# Patient Record
Sex: Female | Born: 1980
Health system: Southern US, Community
[De-identification: ages and names within clinical notes are randomized; demographics above are authoritative.]

## PROBLEM LIST (undated history)

## (undated) DIAGNOSIS — R51 Headache: Secondary | ICD-10-CM

## (undated) DIAGNOSIS — I8390 Asymptomatic varicose veins of unspecified lower extremity: Secondary | ICD-10-CM

## (undated) DIAGNOSIS — I499 Cardiac arrhythmia, unspecified: Secondary | ICD-10-CM

## (undated) DIAGNOSIS — F32A Depression, unspecified: Secondary | ICD-10-CM

## (undated) DIAGNOSIS — L709 Acne, unspecified: Secondary | ICD-10-CM

## (undated) DIAGNOSIS — M94261 Chondromalacia, right knee: Secondary | ICD-10-CM

## (undated) DIAGNOSIS — G4700 Insomnia, unspecified: Secondary | ICD-10-CM

## (undated) DIAGNOSIS — R000 Tachycardia, unspecified: Secondary | ICD-10-CM

## (undated) DIAGNOSIS — F329 Major depressive disorder, single episode, unspecified: Secondary | ICD-10-CM

## (undated) DIAGNOSIS — F419 Anxiety disorder, unspecified: Secondary | ICD-10-CM

## (undated) DIAGNOSIS — Z889 Allergy status to unspecified drugs, medicaments and biological substances status: Secondary | ICD-10-CM

## (undated) HISTORY — DX: Tachycardia, unspecified: R00.0

## (undated) HISTORY — PX: SPINE SURGERY: SHX786

## (undated) HISTORY — DX: Depression, unspecified: F32.A

## (undated) HISTORY — DX: Acne, unspecified: L70.9

## (undated) HISTORY — DX: Asymptomatic varicose veins of unspecified lower extremity: I83.90

## (undated) HISTORY — DX: Chondromalacia, right knee: M94.261

## (undated) HISTORY — DX: Anxiety disorder, unspecified: F41.9

## (undated) HISTORY — PX: MYRINGOTOMY: SHX2060

## (undated) HISTORY — DX: Major depressive disorder, single episode, unspecified: F32.9

---

## 2004-03-02 ENCOUNTER — Encounter: Admission: RE | Admit: 2004-03-02 | Discharge: 2004-03-02 | Payer: Self-pay | Admitting: Obstetrics and Gynecology

## 2004-11-27 ENCOUNTER — Ambulatory Visit: Payer: Self-pay | Admitting: Family Medicine

## 2005-02-27 ENCOUNTER — Ambulatory Visit: Payer: Self-pay | Admitting: Family Medicine

## 2005-06-04 ENCOUNTER — Encounter: Payer: Self-pay | Admitting: Family Medicine

## 2005-06-04 LAB — CONVERTED CEMR LAB

## 2005-06-22 ENCOUNTER — Ambulatory Visit: Payer: Self-pay | Admitting: Family Medicine

## 2006-02-11 ENCOUNTER — Ambulatory Visit: Payer: Self-pay | Admitting: Family Medicine

## 2006-02-25 ENCOUNTER — Ambulatory Visit: Payer: Self-pay | Admitting: Cardiology

## 2006-03-06 ENCOUNTER — Encounter: Payer: Self-pay | Admitting: Cardiology

## 2006-03-06 ENCOUNTER — Ambulatory Visit: Payer: Self-pay

## 2006-05-02 ENCOUNTER — Ambulatory Visit: Payer: Self-pay | Admitting: Cardiology

## 2006-12-07 ENCOUNTER — Emergency Department: Payer: Self-pay | Admitting: Internal Medicine

## 2007-05-05 LAB — CONVERTED CEMR LAB: Pap Smear: NORMAL

## 2007-06-25 ENCOUNTER — Ambulatory Visit: Payer: Self-pay | Admitting: Family Medicine

## 2007-06-25 DIAGNOSIS — F909 Attention-deficit hyperactivity disorder, unspecified type: Secondary | ICD-10-CM | POA: Insufficient documentation

## 2007-06-25 DIAGNOSIS — M4120 Other idiopathic scoliosis, site unspecified: Secondary | ICD-10-CM | POA: Insufficient documentation

## 2007-06-25 DIAGNOSIS — F3289 Other specified depressive episodes: Secondary | ICD-10-CM | POA: Insufficient documentation

## 2007-06-25 DIAGNOSIS — F329 Major depressive disorder, single episode, unspecified: Secondary | ICD-10-CM

## 2007-06-26 LAB — CONVERTED CEMR LAB
Albumin: 3.2 g/dL — ABNORMAL LOW (ref 3.5–5.2)
Alkaline Phosphatase: 45 units/L (ref 39–117)
Basophils Absolute: 0 10*3/uL (ref 0.0–0.1)
Basophils Relative: 0.3 % (ref 0.0–1.0)
CO2: 24 meq/L (ref 19–32)
Calcium: 8.9 mg/dL (ref 8.4–10.5)
Cholesterol: 153 mg/dL (ref 0–200)
Eosinophils Relative: 0.6 % (ref 0.0–5.0)
GFR calc non Af Amer: 127 mL/min
Hemoglobin: 13.2 g/dL (ref 12.0–15.0)
LDL Cholesterol: 76 mg/dL (ref 0–99)
MCV: 91.4 fL (ref 78.0–100.0)
Monocytes Absolute: 0.6 10*3/uL (ref 0.2–0.7)
Neutro Abs: 5.5 10*3/uL (ref 1.4–7.7)
Neutrophils Relative %: 66.8 % (ref 43.0–77.0)
Potassium: 3.8 meq/L (ref 3.5–5.1)
RBC: 4.09 M/uL (ref 3.87–5.11)
Total CHOL/HDL Ratio: 2.5
Total Protein: 6.3 g/dL (ref 6.0–8.3)

## 2007-08-06 ENCOUNTER — Inpatient Hospital Stay (HOSPITAL_COMMUNITY): Admission: AD | Admit: 2007-08-06 | Discharge: 2007-08-06 | Payer: Self-pay | Admitting: *Deleted

## 2007-11-11 ENCOUNTER — Encounter (INDEPENDENT_AMBULATORY_CARE_PROVIDER_SITE_OTHER): Payer: Self-pay | Admitting: Obstetrics

## 2007-11-11 ENCOUNTER — Ambulatory Visit (HOSPITAL_COMMUNITY): Admission: RE | Admit: 2007-11-11 | Discharge: 2007-11-11 | Payer: Self-pay | Admitting: Obstetrics

## 2007-11-11 ENCOUNTER — Ambulatory Visit: Payer: Self-pay | Admitting: Surgery

## 2007-12-05 ENCOUNTER — Encounter: Payer: Self-pay | Admitting: Family Medicine

## 2007-12-05 ENCOUNTER — Emergency Department (HOSPITAL_COMMUNITY): Admission: EM | Admit: 2007-12-05 | Discharge: 2007-12-05 | Payer: Self-pay | Admitting: Emergency Medicine

## 2007-12-23 ENCOUNTER — Inpatient Hospital Stay (HOSPITAL_COMMUNITY): Admission: AD | Admit: 2007-12-23 | Discharge: 2007-12-23 | Payer: Self-pay | Admitting: Obstetrics

## 2007-12-25 ENCOUNTER — Inpatient Hospital Stay (HOSPITAL_COMMUNITY): Admission: AD | Admit: 2007-12-25 | Discharge: 2007-12-29 | Payer: Self-pay | Admitting: Obstetrics and Gynecology

## 2007-12-25 ENCOUNTER — Encounter (INDEPENDENT_AMBULATORY_CARE_PROVIDER_SITE_OTHER): Payer: Self-pay | Admitting: Obstetrics and Gynecology

## 2008-06-04 HISTORY — PX: DIAGNOSTIC LAPAROSCOPY: SUR761

## 2008-08-30 ENCOUNTER — Ambulatory Visit: Payer: Self-pay | Admitting: Family Medicine

## 2008-08-30 ENCOUNTER — Encounter: Admission: RE | Admit: 2008-08-30 | Discharge: 2008-08-30 | Payer: Self-pay | Admitting: Family Medicine

## 2008-08-30 ENCOUNTER — Telehealth: Payer: Self-pay | Admitting: Family Medicine

## 2008-11-17 ENCOUNTER — Ambulatory Visit: Payer: Self-pay | Admitting: Family Medicine

## 2009-01-23 ENCOUNTER — Encounter: Payer: Self-pay | Admitting: Family Medicine

## 2009-02-24 ENCOUNTER — Ambulatory Visit: Payer: Self-pay | Admitting: Family Medicine

## 2009-02-24 ENCOUNTER — Ambulatory Visit: Payer: Self-pay | Admitting: Internal Medicine

## 2009-02-24 LAB — CONVERTED CEMR LAB
AST: 18 units/L (ref 0–37)
BUN: 11 mg/dL (ref 6–23)
Basophils Absolute: 0 10*3/uL (ref 0.0–0.1)
Basophils Relative: 0.3 % (ref 0.0–3.0)
Bilirubin Urine: NEGATIVE
Creatinine, Ser: 0.9 mg/dL (ref 0.4–1.2)
Eosinophils Absolute: 0.1 10*3/uL (ref 0.0–0.7)
Glucose, Urine, Semiquant: NEGATIVE
HCT: 39.5 % (ref 36.0–46.0)
Hemoglobin: 13.8 g/dL (ref 12.0–15.0)
Ketones, urine, test strip: NEGATIVE
Nitrite: NEGATIVE
Platelets: 215 10*3/uL (ref 150.0–400.0)
Potassium: 4.4 meq/L (ref 3.5–5.1)
Protein, U semiquant: NEGATIVE
RDW: 12.4 % (ref 11.5–14.6)
Sodium: 138 meq/L (ref 135–145)
WBC: 7.4 10*3/uL (ref 4.5–10.5)

## 2009-02-25 ENCOUNTER — Telehealth: Payer: Self-pay | Admitting: Family Medicine

## 2009-03-05 ENCOUNTER — Emergency Department (HOSPITAL_COMMUNITY): Admission: EM | Admit: 2009-03-05 | Discharge: 2009-03-06 | Payer: Self-pay | Admitting: Emergency Medicine

## 2009-03-05 ENCOUNTER — Encounter: Payer: Self-pay | Admitting: Family Medicine

## 2009-03-05 ENCOUNTER — Telehealth: Payer: Self-pay | Admitting: Internal Medicine

## 2009-03-06 ENCOUNTER — Encounter: Payer: Self-pay | Admitting: Family Medicine

## 2009-03-07 ENCOUNTER — Ambulatory Visit: Payer: Self-pay | Admitting: Family Medicine

## 2009-03-11 ENCOUNTER — Encounter: Admission: RE | Admit: 2009-03-11 | Discharge: 2009-03-11 | Payer: Self-pay | Admitting: Family Medicine

## 2009-03-14 ENCOUNTER — Ambulatory Visit: Payer: Self-pay | Admitting: Gastroenterology

## 2009-03-14 ENCOUNTER — Encounter: Payer: Self-pay | Admitting: Family Medicine

## 2009-03-14 DIAGNOSIS — D1803 Hemangioma of intra-abdominal structures: Secondary | ICD-10-CM

## 2009-03-14 LAB — CONVERTED CEMR LAB
AST: 20 units/L (ref 0–37)
Albumin: 4.2 g/dL (ref 3.5–5.2)
Alkaline Phosphatase: 57 units/L (ref 39–117)
BUN: 8 mg/dL (ref 6–23)
Chloride: 106 meq/L (ref 96–112)
Creatinine, Ser: 0.7 mg/dL (ref 0.4–1.2)
Eosinophils Absolute: 0.1 10*3/uL (ref 0.0–0.7)
GFR calc non Af Amer: 104.5 mL/min (ref >60)
HCT: 41 % (ref 36.0–46.0)
Lymphocytes Relative: 40.1 % (ref 12.0–46.0)
Monocytes Absolute: 0.3 10*3/uL (ref 0.1–1.0)
Monocytes Relative: 6.9 % (ref 3.0–12.0)
Neutro Abs: 2.5 10*3/uL (ref 1.4–7.7)
RDW: 12.3 % (ref 11.5–14.6)
Total Bilirubin: 0.7 mg/dL (ref 0.3–1.2)
WBC: 4.8 10*3/uL (ref 4.5–10.5)

## 2009-03-16 LAB — CONVERTED CEMR LAB: Tissue Transglutaminase Ab, IgA: 0.3 units (ref <7)

## 2009-04-12 ENCOUNTER — Ambulatory Visit: Payer: Self-pay | Admitting: Gastroenterology

## 2009-04-13 LAB — CONVERTED CEMR LAB
Bilirubin Urine: NEGATIVE
Ketones, ur: NEGATIVE mg/dL
Total Protein, Urine: NEGATIVE mg/dL
Urine Glucose: NEGATIVE mg/dL
Urobilinogen, UA: 0.2 (ref 0.0–1.0)
pH: 7 (ref 5.0–8.0)

## 2009-04-14 ENCOUNTER — Ambulatory Visit (HOSPITAL_COMMUNITY): Admission: RE | Admit: 2009-04-14 | Discharge: 2009-04-14 | Payer: Self-pay | Admitting: Gastroenterology

## 2009-04-15 ENCOUNTER — Ambulatory Visit: Payer: Self-pay | Admitting: Family Medicine

## 2009-04-15 ENCOUNTER — Telehealth: Payer: Self-pay | Admitting: Gastroenterology

## 2009-04-15 DIAGNOSIS — F411 Generalized anxiety disorder: Secondary | ICD-10-CM | POA: Insufficient documentation

## 2009-04-18 LAB — CONVERTED CEMR LAB
LDL Cholesterol: 87 mg/dL (ref 0–99)
TSH: 1.73 microintl units/mL (ref 0.35–5.50)
Total CHOL/HDL Ratio: 3

## 2009-04-19 ENCOUNTER — Telehealth: Payer: Self-pay | Admitting: Gastroenterology

## 2009-04-20 ENCOUNTER — Encounter (INDEPENDENT_AMBULATORY_CARE_PROVIDER_SITE_OTHER): Payer: Self-pay | Admitting: *Deleted

## 2009-04-20 ENCOUNTER — Telehealth: Payer: Self-pay | Admitting: Gastroenterology

## 2009-04-25 ENCOUNTER — Encounter: Payer: Self-pay | Admitting: Family Medicine

## 2009-05-02 ENCOUNTER — Encounter (INDEPENDENT_AMBULATORY_CARE_PROVIDER_SITE_OTHER): Payer: Self-pay | Admitting: *Deleted

## 2009-05-03 ENCOUNTER — Ambulatory Visit: Payer: Self-pay | Admitting: Gastroenterology

## 2009-05-11 ENCOUNTER — Ambulatory Visit: Payer: Self-pay | Admitting: Gastroenterology

## 2009-05-13 ENCOUNTER — Telehealth: Payer: Self-pay | Admitting: Internal Medicine

## 2009-05-16 ENCOUNTER — Encounter: Payer: Self-pay | Admitting: Gastroenterology

## 2009-05-16 DIAGNOSIS — D239 Other benign neoplasm of skin, unspecified: Secondary | ICD-10-CM

## 2009-05-16 HISTORY — DX: Other benign neoplasm of skin, unspecified: D23.9

## 2009-05-30 ENCOUNTER — Ambulatory Visit: Payer: Self-pay | Admitting: Family Medicine

## 2009-08-05 ENCOUNTER — Ambulatory Visit (HOSPITAL_COMMUNITY): Admission: RE | Admit: 2009-08-05 | Discharge: 2009-08-05 | Payer: Self-pay | Admitting: Obstetrics

## 2009-12-01 ENCOUNTER — Ambulatory Visit: Payer: Self-pay | Admitting: Sports Medicine

## 2009-12-01 DIAGNOSIS — M25569 Pain in unspecified knee: Secondary | ICD-10-CM

## 2009-12-02 ENCOUNTER — Encounter (INDEPENDENT_AMBULATORY_CARE_PROVIDER_SITE_OTHER): Payer: Self-pay | Admitting: *Deleted

## 2009-12-02 ENCOUNTER — Encounter: Admission: RE | Admit: 2009-12-02 | Discharge: 2009-12-02 | Payer: Self-pay | Admitting: Family Medicine

## 2009-12-09 ENCOUNTER — Ambulatory Visit (HOSPITAL_COMMUNITY): Admission: RE | Admit: 2009-12-09 | Discharge: 2009-12-09 | Payer: Self-pay | Admitting: Family Medicine

## 2009-12-12 ENCOUNTER — Encounter: Payer: Self-pay | Admitting: Sports Medicine

## 2010-03-06 ENCOUNTER — Encounter: Payer: Self-pay | Admitting: Family Medicine

## 2010-03-29 ENCOUNTER — Telehealth (INDEPENDENT_AMBULATORY_CARE_PROVIDER_SITE_OTHER): Payer: Self-pay | Admitting: *Deleted

## 2010-04-03 ENCOUNTER — Ambulatory Visit: Payer: Self-pay | Admitting: Family Medicine

## 2010-04-03 LAB — CONVERTED CEMR LAB
Albumin: 3.7 g/dL (ref 3.5–5.2)
Alkaline Phosphatase: 47 units/L (ref 39–117)
BUN: 15 mg/dL (ref 6–23)
Basophils Absolute: 0 10*3/uL (ref 0.0–0.1)
Basophils Relative: 0.5 % (ref 0.0–3.0)
Chloride: 97 meq/L (ref 96–112)
Creatinine, Ser: 0.8 mg/dL (ref 0.4–1.2)
Eosinophils Relative: 0.7 % (ref 0.0–5.0)
Glucose, Bld: 74 mg/dL (ref 70–99)
HCT: 36.4 % (ref 36.0–46.0)
HDL: 51.9 mg/dL (ref >39.00)
Lymphocytes Relative: 27 % (ref 12.0–46.0)
Lymphs Abs: 2.4 10*3/uL (ref 0.7–4.0)
MCV: 90.4 fL (ref 78.0–100.0)
Monocytes Absolute: 0.6 10*3/uL (ref 0.1–1.0)
Neutro Abs: 5.9 10*3/uL (ref 1.4–7.7)
Neutrophils Relative %: 65.1 % (ref 43.0–77.0)
Platelets: 254 10*3/uL (ref 150.0–400.0)
Sodium: 136 meq/L (ref 135–145)
Total Protein: 6.5 g/dL (ref 6.0–8.3)
Triglycerides: 68 mg/dL (ref 0.0–149.0)

## 2010-04-10 HISTORY — PX: KNEE SURGERY: SHX244

## 2010-04-11 ENCOUNTER — Ambulatory Visit: Payer: Self-pay | Admitting: Family Medicine

## 2010-04-13 ENCOUNTER — Ambulatory Visit (HOSPITAL_BASED_OUTPATIENT_CLINIC_OR_DEPARTMENT_OTHER): Admission: RE | Admit: 2010-04-13 | Discharge: 2010-04-13 | Payer: Self-pay | Admitting: Orthopedic Surgery

## 2010-06-12 ENCOUNTER — Encounter: Payer: Self-pay | Admitting: Family Medicine

## 2010-07-05 NOTE — Letter (Signed)
Summary: [facility]   Imported By: Marily Memos 12/13/2009 15:06:53  _____________________________________________________________________  External Attachment:    Type:   Image     Comment:   External Document

## 2010-07-05 NOTE — Assessment & Plan Note (Signed)
Summary: NP , R KNEE ISSUES,MC   Vital Signs:  Patient profile:   30 year old female BP sitting:   110 / 77  Vitals Entered By: Lillia Pauls CMA (December 01, 2009 9:46 AM)  Primary Care Provider:  [facility]   History of Present Illness: 30 yo F here with right knee pain  Patient reports remote injury when she was 30 years old - believes she was kicked in the knee when playing soccer Had swelling, limping, instability at that time - told her knee was sprained with only x-rays. Has continued to struggle intermittently since that time with medial right knee pain. No current swelling but does have catching and giving out. Worse with twisting and with running - would like to run but pain limits her. Able to walk with minimal pain currently.  Allergies (verified): 1)  ! Effexor 2)  ! Lexapro  Physical Exam  General:  Well-developed,well-nourished,in no acute distress; alert,appropriate and cooperative throughout examination Msk:  Knee: Normal to inspection with no erythema or effusion or obvious bony abnormalities. Medial joint line TTP.  No post patellar facet or lateral joint line TTP. ROM normal in flexion and extension and lower leg rotation.  Mild pain with hyperflexion. Ligaments with solid consistent endpoints including ACL, PCL, LCL, MCL. Mcmurrays causes pain medially. Non painful patellar compression. Patellar and quadriceps tendons unremarkable. Hamstring and quadriceps strength is normal.  Hip abduction strength 5/5 No VMO atrophy.   Impression & Recommendations:  Problem # 1:  KNEE PAIN, RIGHT (ICD-719.46) Assessment New Concern for medial meniscal tear vs early DJD.  Will get x-rays to further assess.  This may date back to injury suffered when she was 30 years old.  If x-rays without evidence significant DJD, will get MRI of knee to assess for meniscal tear and likely refer to ortho for arthroscopy.  She would like to get back to running and has been dealing  with this for several years so will be aggressive in treatment.  Update: x-ray results show minimal DJD so will proceed with MRI of right knee.  Discussed results with patient.  Orders: Diagnostic X-Ray/Fluoroscopy (Diagnostic X-Ray/Flu) MRI without Contrast (MRI w/o Contrast)  Complete Medication List: 1)  Calcium 600 Mg Tabs (Calcium) .... Take 1-2 by mouth daily 2)  Klonopin 1 Mg Tabs (Clonazepam) .... Take one and one half tablet at bedtime 3)  Merina Iud  4)  Oracea 40 Mg Cpdr (Doxycycline (rosacea)) .Marland Kitchen.. 1 by mouth once daily 5)  Lamictal 100 Mg Tabs (Lamotrigine) .... Take one tablet once daily by mouth 6)  Cheratussin Ac 100-10 Mg/24ml Syrp (Guaifenesin-codeine) .... 5 ml at bedtime as needed for cough.  Patient Instructions: 1)  cell: [phone] - [phone]

## 2010-07-05 NOTE — Letter (Signed)
Summary: Consuela Mimes MD  Consuela Mimes MD   Imported By: Lanelle Bal 03/27/2010 15:38:06  _____________________________________________________________________  External Attachment:    Type:   Image     Comment:   External Document

## 2010-07-05 NOTE — Miscellaneous (Signed)
Summary: MRI APPT  [place] THURS JULY 7TH AT 10AM

## 2010-07-05 NOTE — Progress Notes (Signed)
----   Converted from flag ---- ---- 03/28/2010 9:54 PM, Judith Part MD wrote: please check lipids and wellness, thanks [email or web address]  ---- 03/23/2010 11:57 AM, Liane Comber CMA (AAMA) wrote: Lab orders please! Good Morning! This pt is scheduled for cpx labs Monday, which labs to draw and dx codes to use? Thanks Tasha ------------------------------

## 2010-07-05 NOTE — Assessment & Plan Note (Signed)
Summary: CPX/DLO   Vital Signs:  Patient profile:   30 year old female Height:      67.25 inches Weight:      172.25 pounds BMI:     26.87 Temp:     98.8 degrees F oral Pulse rate:   84 / minute Pulse rhythm:   regular BP sitting:   104 / 72  (left arm) Cuff size:   regular  Vitals Entered By: Lewanda Rife LPN (April 11, 2010 9:31 AM) CC: CPX GYN did pap and breast exam   History of Present Illness: here for wellness exam and to review chronic problems   has been very busy at work -- doing well overall   is having knee surgery on thursday -- for stage 3 chondromalasia  will go in scope and lateral release on R knee - just the R  also clean out arthritis under knee cap as well  has done physical therapy   wants to stay active -- used to walk run -- has not done since august  no opportunity to swim bike is painful   wt is down 3 lb with bmi of 26 was down to 165 in past -- when she could exercise   bp great t 104/72   lipids are very good with trig of 60 and HDL 51 and LDL of 76    gyn exam without pap in oct (gets pap every 3 years )  sees gyn yearly  no breast lumps   emotionally is a lot better on current meds -- is finally sleeping 6-7 hours per night  ambien made a big difference  is ok with anxiety -- it still comes and goes  some stress - son had to trigger thumb repair- that went well  still has great anxiety over giving injections -- 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 Effexor 2)  ! 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 Eyes:  Denies blurring and eye pain. CV:  Denies chest pain or discomfort, lightheadness, palpitations, and shortness of breath with exertion. Resp:  Denies cough and shortness of breath. GI:  Denies abdominal pain, bloody stools, change in bowel habits, indigestion, and nausea. GU:  Denies abnormal vaginal bleeding, discharge, dysuria, and urinary frequency. MS:  Complains of joint pain, joint swelling, and stiffness; denies joint redness and muscle weakness. Derm:  Denies itching, lesion(s), poor wound healing, and rash. Neuro:  Denies numbness and tingling. Psych:  overall doing better with her mood . Endo:  Denies cold intolerance, excessive thirst, excessive urination, and heat intolerance. Heme:  Denies abnormal bruising and bleeding.  Physical Exam  General:  Well-developed,well-nourished,in no acute distress; alert,appropriate and cooperative throughout examination Head:  normocephalic, atraumatic, and no abnormalities observed.   Eyes:  vision grossly intact, pupils  equal, pupils round, and pupils reactive to light.  no conjunctival pallor, injection or icterus  Ears:  R ear normal and L ear normal.   Nose:  no nasal discharge.   Mouth:  pharynx pink and moist.   Neck:  supple with full rom and no masses or thyromegally, no JVD or carotid bruit  Chest Wall:  No deformities, masses, or tenderness noted. Lungs:  Normal respiratory effort, chest expands symmetrically.  Occassional exp wheezes. Heart:  Normal rate and regular rhythm. S1 and S2 normal without gallop, murmur, click, rub or other extra sounds. Abdomen:  Bowel sounds positive,abdomen soft and non-tender without masses,  organomegaly or hernias noted. no renal bruits Msk:  No deformity or scoliosis noted of thoracic or lumbar spine.  poor rom R knee  Pulses:  R and L carotid,radial,femoral,dorsalis pedis and posterior tibial pulses are full and equal bilaterally Extremities:  No clubbing, cyanosis, edema, or deformity noted with normal full range of motion of all joints.   Neurologic:  sensation intact to light touch, gait normal, and DTRs symmetrical and normal.   Skin:  Intact without suspicious lesions or rashes Cervical Nodes:  No lymphadenopathy noted Inguinal Nodes:  No significant adenopathy Psych:  normal affect, talkative and pleasant    Impression & Recommendations:  Problem # 1:  HEALTH MAINTENANCE EXAM (ICD-V70.0) Assessment Comment Only reviewed health habits including diet, exercise and skin cancer prevention reviewed health maintenance list and family history rev labs in detail will work on wt loss   Problem # 2:  KNEE PAIN, RIGHT (ICD-719.46) Assessment: Deteriorated for surgery soon - chondromalacia Her updated medication list for this problem includes:    Mobic 15 Mg Tabs (Meloxicam) .Marland Kitchen... Take 1 tablet by mouth once a day as needed.  Problem # 3:  ANXIETY (ICD-300.00) overall better with psych tx and better sleep  wlll get back to exercise when she can Her updated medication list for this problem includes:    Klonopin 1 Mg Tabs (Clonazepam) .Marland Kitchen... Take one and one half tablet at bedtime  Complete Medication List: 1)  Calcium 600 Mg Tabs (Calcium) .... Take 1-2 by mouth daily 2)  Klonopin 1 Mg Tabs (Clonazepam) .... Take one and one half tablet at bedtime 3)  Oracea 40 Mg Cpdr (Doxycycline (rosacea)) .Marland Kitchen.. 1 by mouth once daily as needed 4)  Lamictal 100 Mg Tabs (Lamotrigine) .... Take one tablet once daily by mouth 5)  Zolpidem Tartrate 10 Mg Tabs (Zolpidem tartrate) .... One tablet at bedtime as needed 6)  Mobic 15 Mg Tabs (Meloxicam) .... Take 1 tablet by mouth once a day as  needed.  Patient Instructions: 1)  labs look good  2)  work on healthy diet 3)  good luck with knee surgery    Orders Added: 1)  Est. Patient 18-39 years [[id number]]   Immunization History:  Influenza Immunization History:    Influenza:  historical [facility] at [facility] (04/04/2010)   Immunization History:  Influenza Immunization History:    Influenza:  Historical received at [facility] (04/04/2010)  Current Allergies (reviewed today): ! [facility] ! LEXAPRO

## 2010-07-05 NOTE — Letter (Signed)
Summary: SOS  SOS   Imported By: Marily Memos 12/13/2009 15:06:12  _____________________________________________________________________  External Attachment:    Type:   Image     Comment:   External Document

## 2010-07-06 NOTE — Letter (Signed)
Summary: [facility]  [facility]   Imported By: Maryln Gottron 06/26/2010 14:59:07  _____________________________________________________________________  External Attachment:    Type:   Image     Comment:   External Document

## 2010-08-08 ENCOUNTER — Emergency Department (HOSPITAL_COMMUNITY)
Admission: EM | Admit: 2010-08-08 | Discharge: 2010-08-09 | Disposition: A | Discharge status: Home / Self Care | Payer: 59 | Attending: Emergency Medicine | Admitting: Emergency Medicine

## 2010-08-08 ENCOUNTER — Inpatient Hospital Stay (INDEPENDENT_AMBULATORY_CARE_PROVIDER_SITE_OTHER)
Admission: RE | Admit: 2010-08-08 | Discharge: 2010-08-08 | Disposition: A | Discharge status: Home / Self Care | Payer: 59 | Source: Ambulatory Visit | Attending: Emergency Medicine | Admitting: Emergency Medicine

## 2010-08-08 ENCOUNTER — Emergency Department (HOSPITAL_COMMUNITY): Payer: 59

## 2010-08-08 DIAGNOSIS — F329 Major depressive disorder, single episode, unspecified: Secondary | ICD-10-CM | POA: Insufficient documentation

## 2010-08-08 DIAGNOSIS — R42 Dizziness and giddiness: Secondary | ICD-10-CM

## 2010-08-08 DIAGNOSIS — R002 Palpitations: Secondary | ICD-10-CM | POA: Insufficient documentation

## 2010-08-08 DIAGNOSIS — I498 Other specified cardiac arrhythmias: Secondary | ICD-10-CM | POA: Insufficient documentation

## 2010-08-08 DIAGNOSIS — R51 Headache: Secondary | ICD-10-CM | POA: Insufficient documentation

## 2010-08-08 DIAGNOSIS — F3289 Other specified depressive episodes: Secondary | ICD-10-CM | POA: Insufficient documentation

## 2010-08-08 LAB — CBC
MCH: 28.3 pg (ref 26.0–34.0)
Platelets: 205 10*3/uL (ref 150–400)
RBC: 3.81 MIL/uL — ABNORMAL LOW (ref 3.87–5.11)
RDW: 13.1 % (ref 11.5–15.5)
WBC: 11.2 10*3/uL — ABNORMAL HIGH (ref 4.0–10.5)

## 2010-08-08 LAB — DIFFERENTIAL
Basophils Relative: 0 % (ref 0–1)
Eosinophils Absolute: 0 10*3/uL (ref 0.0–0.7)
Neutrophils Relative %: 86 % — ABNORMAL HIGH (ref 43–77)

## 2010-08-08 LAB — RAPID URINE DRUG SCREEN, HOSP PERFORMED
Amphetamines: NOT DETECTED
Benzodiazepines: POSITIVE — AB
Cocaine: NOT DETECTED
Opiates: NOT DETECTED
Tetrahydrocannabinol: NOT DETECTED
Tetrahydrocannabinol: NOT DETECTED

## 2010-08-08 LAB — POCT I-STAT, CHEM 8
Calcium, Ion: 1.13 mmol/L (ref 1.12–1.32)
Chloride: 103 mEq/L (ref 96–112)
Glucose, Bld: 94 mg/dL (ref 70–99)
HCT: 37 % (ref 36.0–46.0)
HCT: 32 % — ABNORMAL LOW (ref 36.0–46.0)
Hemoglobin: 12.6 g/dL (ref 12.0–15.0)
Potassium: 3.4 mEq/L — ABNORMAL LOW (ref 3.5–5.1)
Sodium: 140 mEq/L (ref 135–145)
TCO2: 21 mmol/L (ref 0–100)

## 2010-08-08 LAB — URINALYSIS, ROUTINE W REFLEX MICROSCOPIC
Bilirubin Urine: NEGATIVE
Nitrite: NEGATIVE
Protein, ur: NEGATIVE mg/dL
Specific Gravity, Urine: 1.007 (ref 1.005–1.030)
Urobilinogen, UA: 0.2 mg/dL (ref 0.0–1.0)

## 2010-08-08 LAB — POCT URINALYSIS DIPSTICK
Bilirubin Urine: NEGATIVE
Glucose, UA: NEGATIVE mg/dL
Nitrite: NEGATIVE
Specific Gravity, Urine: 1.01 (ref 1.005–1.030)
pH: 7.5 (ref 5.0–8.0)

## 2010-08-08 LAB — POCT PREGNANCY, URINE
Preg Test, Ur: NEGATIVE
Preg Test, Ur: NEGATIVE

## 2010-08-08 LAB — GLUCOSE, CAPILLARY: Glucose-Capillary: 79 mg/dL (ref 70–99)

## 2010-08-09 LAB — URINE CULTURE: Culture  Setup Time: 201203062305

## 2010-08-11 DIAGNOSIS — R002 Palpitations: Secondary | ICD-10-CM | POA: Insufficient documentation

## 2010-08-14 ENCOUNTER — Encounter: Payer: 59 | Admitting: Physician Assistant

## 2010-08-14 ENCOUNTER — Ambulatory Visit: Payer: 59 | Admitting: Family Medicine

## 2010-08-27 LAB — URINALYSIS, ROUTINE W REFLEX MICROSCOPIC
Glucose, UA: NEGATIVE mg/dL
Hgb urine dipstick: NEGATIVE
Protein, ur: NEGATIVE mg/dL
Specific Gravity, Urine: 1.01 (ref 1.005–1.030)

## 2010-08-27 LAB — CBC
HCT: 38.1 % (ref 36.0–46.0)
Hemoglobin: 12.9 g/dL (ref 12.0–15.0)
MCHC: 33.8 g/dL (ref 30.0–36.0)
MCV: 90.8 fL (ref 78.0–100.0)
Platelets: 223 10*3/uL (ref 150–400)
RDW: 12.6 % (ref 11.5–15.5)

## 2010-08-27 LAB — BASIC METABOLIC PANEL
BUN: 7 mg/dL (ref 6–23)
CO2: 27 mEq/L (ref 19–32)
Chloride: 107 mEq/L (ref 96–112)
GFR calc non Af Amer: >60 mL/min (ref >60)
Glucose, Bld: 96 mg/dL (ref 70–99)
Potassium: 3.6 mEq/L (ref 3.5–5.1)
Sodium: 139 mEq/L (ref 135–145)

## 2010-09-07 ENCOUNTER — Encounter: Payer: Self-pay | Admitting: Physician Assistant

## 2010-09-07 LAB — COMPREHENSIVE METABOLIC PANEL
Albumin: 4.1 g/dL (ref 3.5–5.2)
BUN: 9 mg/dL (ref 6–23)
Calcium: 9 mg/dL (ref 8.4–10.5)
Glucose, Bld: 87 mg/dL (ref 70–99)
Potassium: 3.6 mEq/L (ref 3.5–5.1)
Total Protein: 7.4 g/dL (ref 6.0–8.3)

## 2010-09-07 LAB — URINALYSIS, ROUTINE W REFLEX MICROSCOPIC
Glucose, UA: NEGATIVE mg/dL
Ketones, ur: 40 mg/dL — AB
Leukocytes, UA: NEGATIVE
Nitrite: NEGATIVE
Protein, ur: NEGATIVE mg/dL
pH: 5 (ref 5.0–8.0)

## 2010-09-07 LAB — WET PREP, GENITAL
Trich, Wet Prep: NONE SEEN
Yeast Wet Prep HPF POC: NONE SEEN

## 2010-09-07 LAB — DIFFERENTIAL
Lymphocytes Relative: 27 % (ref 12–46)
Lymphs Abs: 2.3 10*3/uL (ref 0.7–4.0)
Monocytes Absolute: 0.5 10*3/uL (ref 0.1–1.0)
Monocytes Relative: 7 % (ref 3–12)
Neutro Abs: 5.4 10*3/uL (ref 1.7–7.7)
Neutrophils Relative %: 65 % (ref 43–77)

## 2010-09-07 LAB — URINE MICROSCOPIC-ADD ON

## 2010-09-07 LAB — CBC
HCT: 39.6 % (ref 36.0–46.0)
Hemoglobin: 13.5 g/dL (ref 12.0–15.0)
MCHC: 34.2 g/dL (ref 30.0–36.0)
Platelets: 229 10*3/uL (ref 150–400)
RDW: 12.9 % (ref 11.5–15.5)

## 2010-09-07 LAB — GC/CHLAMYDIA PROBE AMP, GENITAL: Chlamydia, DNA Probe: NEGATIVE

## 2010-09-11 ENCOUNTER — Other Ambulatory Visit: Payer: Self-pay | Admitting: Internal Medicine

## 2010-09-11 ENCOUNTER — Ambulatory Visit (INDEPENDENT_AMBULATORY_CARE_PROVIDER_SITE_OTHER): Payer: 59 | Admitting: Physician Assistant

## 2010-09-11 ENCOUNTER — Encounter: Payer: Self-pay | Admitting: Physician Assistant

## 2010-09-11 VITALS — BP 114/76 | HR 106 | Ht 68.0 in | Wt 171.0 lb

## 2010-09-11 DIAGNOSIS — R002 Palpitations: Secondary | ICD-10-CM

## 2010-09-11 DIAGNOSIS — I498 Other specified cardiac arrhythmias: Secondary | ICD-10-CM

## 2010-09-11 DIAGNOSIS — R000 Tachycardia, unspecified: Secondary | ICD-10-CM

## 2010-09-11 LAB — BASIC METABOLIC PANEL
BUN: 8 mg/dL (ref 6–23)
CO2: 28 mEq/L (ref 19–32)
Chloride: 103 mEq/L (ref 96–112)
Creatinine, Ser: 0.9 mg/dL (ref 0.4–1.2)
Potassium: 3.9 mEq/L (ref 3.5–5.1)

## 2010-09-11 LAB — D-DIMER, QUANTITATIVE: D-Dimer, Quant: <0.22 ug/mL-FEU (ref 0.00–0.48)

## 2010-09-11 MED ORDER — METOPROLOL TARTRATE 25 MG PO TABS: ORAL_TABLET | ORAL | Status: DC

## 2010-09-11 NOTE — Assessment & Plan Note (Addendum)
Overall, this seems to be more related to anxiety than anything else.  She had negative workup in the past.  Although, that workup was done 5 years ago.  I will obtain a repeat echocardiogram to reassess her LV function.  I will also have her wear a 30 day event monitor.  She did have a recent surgery.  I think the likelihood that she has had a pulmonary embolism is quite low.  However, I will also get a d-dimer.  I will bring her back in followup in 6 weeks.  She prefers to followup with Dr. Graciela Husbands.  I will also give her Lopressor 25 mg one half tablet b.i.d. P.r.n.

## 2010-09-11 NOTE — Patient Instructions (Signed)
Your physician recommends that you return for lab work in: TODAY D DIMER 427.89, BMET 427.89,   Your physician has requested that you have an echocardiogram 427.89, 785.1. Echocardiography is a painless test that uses sound waves to create images of your heart. It provides your doctor with information about the size and shape of your heart and how well your heart's chambers and valves are working. This procedure takes approximately one hour. There are no restrictions for this procedure.  Your physician has recommended that you wear an event monitor 427.89, 785.1. Event monitors are medical devices that record the heart's electrical activity. Doctors most often [place] these monitors to diagnose arrhythmias. Arrhythmias are problems with the speed or rhythm of the heartbeat. The monitor is a small, portable device. You can wear one while you do your normal daily activities. This is usually used to diagnose what is causing palpitations/syncope (passing out).  Your physician recommends that you schedule a follow-up appointment in: 6 weeks with Dr. Graciela Husbands as per Tereso Newcomer, PA-C  Your physician has recommended you make the following change in your medication:  START METOPROLOL TARTRATE 25 MG 1/2 (HALF) TABELT TWICE DAILY PRN.

## 2010-09-11 NOTE — Progress Notes (Signed)
History of Present Illness: Kimberly Torres is a 30 y.o. female with a h/o sinus tachycardia. She was evaluated by Dr. Jens Som in 2007.  A monitor at that time demonstrated sinus tachycardia.  He could not rule out ectopic atrial tachycardia.  An echocardiogram demonstrated an EF of 70%.  She was treated with beta blockers.  However, the patient notes that she never took this due to fears over side effects and she did well.  She recently presented to the [facility] emergency room 08/08/10 with palpitations.  EKG demonstrated sinus tachycardia.  Creatinine was normal at 0.9.  Hemoglobin was slightly low at 10.9.  TSH was normal at 1.434.  Her chest x-ray was unremarkable.  Her urine drug screen was remarkable only for benzodiazepines.  As noted, she had done fine without the metoprolol until recently.  She has been under a great deal of stress.  Her husband recently had gallbladder surgery.  Prior to that, they had gone to the emergency room several times trying to get a diagnosis for his symptoms and he was very sick.  She also has bought a new house and has been doing some Holiday representative.  She has a 24 year old son and she is a Teacher, early years/pre at [employer].  She was under a great deal of stress when she went to the emergency room.  She became lightheaded.  She also noted palpitations.  Since she went to the emergency room, she has had one episode of skipped heartbeats.  This lasted for couple of seconds.  She has no associated symptoms.  She denies orthopnea, PND.  She denies pedal edema.  She denies chest pain or exertional dyspnea.    Past Medical History  Diagnosis Date  . Depression   . Anxiety   . Chondromalacia of right knee     right  . Sinus tachycardia     eval by Dr. Jens Som in 2007: monior revealed sinus tach. vs EAT; Echo 10/07: EF 70%  . Varicose veins     eval. by vascular surgeon and procedure scheduled to correct    Past Surgical History  Procedure Date  . Myringotomy   .  Cesarean section 2009  . Knee surgery 04/10/2010     Current Outpatient Prescriptions  Medication Sig Dispense Refill  . calcium carbonate (OS-CAL) 600 MG TABS Take 600 mg by mouth. Take 1-2 daily       . chlorpheniramine (CHLOR-TRIMETON) 4 MG tablet Take 4 mg by mouth every 6 (six) hours as needed.        . Clindamycin-Benzoyl Per, Refr, (DUAC) gel Apply topically at bedtime.        . clonazePAM (KLONOPIN) 1 MG tablet Take 1 mg by mouth. Take 1 & 1/2 tablets at bedtime       . ibuprofen (ADVIL,MOTRIN) 400 MG tablet Take 400 mg by mouth as needed.        . lamoTRIgine (LAMICTAL) 100 MG tablet Take 100 mg by mouth daily.        Marland Kitchen tretinoin (ATRALIN) 0.05 % gel Apply topically daily.        Marland Kitchen zolpidem (AMBIEN) 10 MG tablet Take 10 mg by mouth at bedtime as needed.        . doxycycline (ORACEA) 40 MG capsule Take 40 mg by mouth every morning.        . meloxicam (MOBIC) 15 MG tablet Take 15 mg by mouth daily as needed.        . metoprolol  tartrate (LOPRESSOR) 25 MG tablet Take 1/2 (half) tablet twice daily prn  60 tablet  11    Allergies  Allergen Reactions  . Escitalopram Oxalate     REACTION: hypomania  . Venlafaxine     REACTION: made her worse   History  Substance Use Topics  . Smoking status: Never Smoker   . Smokeless tobacco: Never Used  . Alcohol Use: No    Family History  Problem Relation Age of Onset  . Coronary artery disease      grandfather  . Heart attack      grandfather  . Breast cancer      grandmother  . Bipolar disorder      grandmother    ROS:  Please see history of present illness.  She denies fevers, chills, hemoptysis, melena, hematochezia, weight changes, skin changes.  All other systems reviewed and negative.  Vital Signs: BP 114/76  Pulse 106  Ht 5\' 8"  (1.727 m)  Wt 171 lb (77.565 kg)  BMI 26.00 kg/m2  PHYSICAL EXAM: Well nourished, well developed, in no acute distress HEENT: normal Neck: no JVD Endo: no thyromegaly Vasc: no carotid  bruits bilat. Cardiac:  normal S1, S2; RRR; no murmur Lungs:  clear to auscultation bilaterally, no wheezing, rhonchi or rales Abd: soft, nontender, no hepatomegaly Ext: no edema Skin: warm and dry Neuro:  CNs 2-12 intact, no focal abnormalities noted Psyc: normal affect  EKG:  Sinus tachycardia, heart rate 106, normal axis, nonspecific ST-T wave changes  ASSESSMENT AND PLAN:

## 2010-09-11 NOTE — Assessment & Plan Note (Signed)
Her potassium was 3.4 in the emergency room.  I will check a follow up basic metabolic panel today.

## 2010-09-25 ENCOUNTER — Ambulatory Visit (HOSPITAL_COMMUNITY): Payer: 59 | Attending: Family Medicine | Admitting: Radiology

## 2010-09-25 ENCOUNTER — Ambulatory Visit (INDEPENDENT_AMBULATORY_CARE_PROVIDER_SITE_OTHER): Payer: 59

## 2010-09-25 DIAGNOSIS — R000 Tachycardia, unspecified: Secondary | ICD-10-CM

## 2010-09-25 DIAGNOSIS — I471 Supraventricular tachycardia, unspecified: Secondary | ICD-10-CM | POA: Insufficient documentation

## 2010-09-25 DIAGNOSIS — R002 Palpitations: Secondary | ICD-10-CM

## 2010-09-26 ENCOUNTER — Encounter: Payer: Self-pay | Admitting: *Deleted

## 2010-10-17 NOTE — H&P (Signed)
Kimberly Torres, Kimberly Torres           ACCOUNT NO.:  [id number]   MEDICAL RECORD NO.:  [id number]          PATIENT TYPE:  INP   LOCATION:  9130                          FACILITY:  [facility]   PHYSICIAN:  Lenoard Aden, M.D.DATE OF BIRTH:  [birth year]   DATE OF ADMISSION:  12/25/2007  DATE OF DISCHARGE:                              HISTORY & PHYSICAL   CHIEF COMPLAINT:  Decreased fetal movement, scheduled for induction.   HISTORY:  She is a 30 year old white female G2, P0 at 39-5/7 weeks'  gestation who was admitted due to a nonreactive NST and decreased fetal  movement for scheduled induction.  Upon presentation to Labor and  Delivery, fetal heart rate tracing shows repetitive variable and late  decelerations, and decision was made to proceed with C-section.   She has no known drug allergies.   MEDICATIONS:  Prenatal vitamin.   She is nonsmoker and  nondrinker.  She denies domestic or physical  violence.   She has a family history of myocardial infarction, breast cancer, COPD,  depression, attention-deficit disorder, bipolar disorder, and chronic  hypertension.  She has a history of one uncomplicated spontaneous  abortion.  Prenatal course has been complicated by decreased fetal  movement with otherwise reassuring ultrasound surveillance.   PHYSICAL EXAMINATION:  GENERAL:  She is a well-developed, well-nourished  white female in no acute distress.  HEENT:  Normal.  LUNGS:  Clear.  HEART:  Regular rhythm.  ABDOMEN:  Soft, gravid, and nontender.  Cervix is 1 cm thick, vertex -2.  EXTREMITIES:  No cords.  NEUROLOGIC:  Exam is nonfocal.  SKIN:  Intact.   IMPRESSION:  1. Term intrauterine pregnancy with nonreassuring fetal heart rate      tracing.  2. Proceed with primary cesarean section.   PLAN:  Proceed with primary C-section.  Risks and benefits discussed.  Risks of anesthesia, infection, bleeding, and injury to abdominal organs  and need for repair noted; delayed versus  immediate complications to  include bowel and bladder injury noted.  The patient nevertheless wishes  to proceed.      Lenoard Aden, M.D.  Electronically Signed     RJT/MEDQ  D:  12/25/2007  T:  12/26/2007  Job:  [id number]

## 2010-10-17 NOTE — H&P (Signed)
NAME:  Kimberly Torres, Kimberly Torres           ACCOUNT NO.:  [id number]   MEDICAL RECORD NO.:  [id number]          PATIENT TYPE:  MAT   LOCATION:  MATC                          FACILITY:  [facility]   PHYSICIAN:  [place] B. Earlene Plater, M.D.  DATE OF BIRTH:  1981-03-02   DATE OF ADMISSION:  08/06/2007  DATE OF DISCHARGE:                              HISTORY & PHYSICAL   TIME OF VISIT:  65.   CHIEF COMPLAINT:  Nineteen weeks pregnant, crampy abdominal pain.   HISTORY OF PRESENT ILLNESS:  A 30 year old white female, gravida 2, para  0, A1 presents with a several-hour complaint of intermittent crampy  lower abdominal pain which radiates through her upper abdomen.  There is  no vaginal bleeding.  She has had some nausea.  No vomiting.  No  diarrhea.  No fever.  No vaginal bleeding.  Prenatal care to this point  has otherwise been uncomplicated.  She states that the pain is  aggravated with walking or laying flat.   PAST MEDICAL HISTORY:  None.   PAST SURGICAL HISTORY:  None.   OBSTETRICAL HISTORY:  Eight week spontaneous abortion.   SOCIAL HISTORY:  Noncontributory.   MEDICATIONS:  Prenatal vitamins and Tylenol.   ALLERGIES:  None.   SOCIAL HISTORY:  Noncontributory.   FAMILY HISTORY:  Noncontributory.   PHYSICAL EXAMINATION:  VITAL SIGNS:  Temperature 98.5, pulse 105, blood  pressure 109/70, respirations 20, O2 sats 100% on room air.  GENERAL:  Patient is alert and oriented.  Appears in no acute distress.  Does not appear to be in a significant amount of pain, despite her  complaints.  SKIN:  Normal color.  Appears adequately hydrated.  BACK:  No CVA tenderness.  ABDOMEN:  Soft.  Nontender.  No acute signs.  The uterus is somewhat  tender to manipulation but no fundal tenderness.  No suprapubic  tenderness.   Urinalysis is within normal limits.   Ultrasound shows a viable fetus.  Cervical length is 5 cm with  adequately amniotic fluid volumes.   Given the normal cervical length, cervical  exam is deferred.   ASSESSMENT:  A 19-week pregnancy with crampy lower abdominal pain.  I  discussed with the patient the differential diagnoses, including Braxton-  Hicks contractions versus round ligament-type pain.  At this point, her  exam is benign, and her cervical length is well within normal range.  Therefore, she is at an exceedingly low risk for pre-term birth.  Given  this, I recommend rest today, hydration, monitor symptoms, Tylenol  p.r.n.  Notify if symptoms increase or change.  Follow up in one week at  [facility].      Gerri Spore B. Earlene Plater, M.D.  Electronically Signed     WBD/MEDQ  D:  08/06/2007  T:  08/06/2007  Job:  [id number]

## 2010-10-17 NOTE — Op Note (Signed)
NAMESOLINA, HERON           ACCOUNT NO.:  [id number]   MEDICAL RECORD NO.:  [id number]          PATIENT TYPE:  INP   LOCATION:  9130                          FACILITY:  [facility]   PHYSICIAN:  Lenoard Aden, M.D.DATE OF BIRTH:  [birth year]   DATE OF PROCEDURE:  12/25/2007  DATE OF DISCHARGE:                               OPERATIVE REPORT   PREOPERATIVE DIAGNOSIS:  Nonreassuring fetal heart rate tracing at term.   POSTOPERATIVE DIAGNOSES:  Nonreassuring fetal heart rate tracing at term  plus partial placental abruption.   PROCEDURE:  Primary low segment transverse cesarean section, urgent.   SURGEON:  Dr. Lenoard Aden, M.D.   ANESTHESIA:  Spinal by Dr. Pamalee Leyden.   ESTIMATED BLOOD LOSS:  1000 mL.   COMPLICATIONS:  None.   DRAINS:  Foley.   COUNTS:  Correct.   The patient to recovery in good condition.   FINDINGS:  Full-term living female, 7 pounds 10 ounces, Apgars 9 and 9,  cord pH 7.3, and partial 20% placental abruption.  Shoulder cord and  foot cord x1.  Placenta to pathology, intact.  We found normal tubes and  normal ovaries.   PROCEDURE:  After being apprised of risks of anesthesia, infection,  bleeding, injury to abdominal organs, need for repair, the labor's  immediate complications to include bowel and bladder injury, the patient  was brought to the operating room where she was administered a spinal  anesthetic without complications.  She was prepped and draped in usual  sterile fashion.  A Foley catheter placed.  After achieving adequate  anesthesia, dilute Marcaine solution placed, and a Pfannenstiel skin  incision was made with a scalpel and carried down to fascia, which was  nicked in midline and opened transversely with the Mayo scissors.  Rectus muscle was dissected sharply in midline.  Peritoneum entered  sharply with bladder blade placed.  Visceral peritoneum scored sharply  off low uterine segment.  Kerr hysterotomy incision made.  Atraumatic  delivery.  Full-term living female, handed over to the pediatricians.  Apgars 9 and 9.  Cord blood was collected.  Placenta delivered manually.  Intact 3-vessel cord noted.  Uterus exteriorized, curetted using a dry  lap pack, and closed in 2 running imbricating layers of a 0 Monocryl  suture.  Multiple interrupted sutures were placed in the midline x5 to  obtain good hemostasis in the midline of the incision.  Bladder flap  inspected, found to be hemostatic.  Irrigation accomplished.  Normal  tubes and normal ovaries as previously noted.  After the uterus was  placed in the abdominal cavity, repeat irrigation was accomplished.  Good hemostasis was achieved.  Rectus fascia and rectus muscle layer was  inspected.  Fascia then closed using a 0  Monocryl in a continuous running fashion.  The subcutaneous tissue  reapproximated using a 0 plain in a continuous running fashion.  Skin  was closed using staples.  The patient tolerated the procedure well and  was transferred to recovery in good condition.      Lenoard Aden, M.D.  Electronically Signed     RJT/MEDQ  D:  12/26/2007  T:  12/26/2007  Job:  [id number]

## 2010-10-17 NOTE — Op Note (Signed)
NAME:  Kimberly Torres, Kimberly Torres           ACCOUNT NO.:  [id number]   MEDICAL RECORD NO.:  [id number]          PATIENT TYPE:  EMS   LOCATION:  ED                           FACILITY:  [facility]   PHYSICIAN:  Sandria Bales. Kimberly Torres, M.D.  DATE OF BIRTH:  June 30, 1980   DATE OF PROCEDURE:  12/05/2007  DATE OF DISCHARGE:  12/05/2007                               OPERATIVE REPORT   Date of ER consult ??   HISTORY OF PRESENT ILLNESS:  This is a 30 year old white female who is a  patient of [facility].  She is due for her first full-term  pregnancy on December 27, 2007.  She sees Dr. Roxy Torres as her primary  care doctor.   Approximately last Sunday, which will be the 26th of June, she started  developing perirectal pain.  She saw Kimberly Torres in the office earlier  this week.  [facility] referred her to our office, where Kimberly [facility] saw  her on Wednesday, June 30, and did an incision and drainage of a  thrombosed hemorrhoid.  Since going home, Kimberly Torres continues to have  fairly severe rectal pain.  Her husband called me tonight.  I offered  either to see them in the emergency room or refer to be continued in  followup, but she wanted to be seen because of the discomfort, so she  came to the [facility] for me to evaluate her.   Her health is otherwise unremarkable.  She has no allergies.   She takes prenatal vitamins and medicine associated with her pregnancy,  but she is on no other chronic medicines.   She has no significant cardiac, pulmonary, or gastrointestinal disease.   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   PHYSICAL EXAMINATION:  Temperature 98.1, blood pressure 135/89, pulse  118, respirations 18.  On physical exam, really limited to just her rectum.  Along her left  anterior rectum, she has an approximate 2.5 to 3 cm external hemorrhoid,  which looks like it has some partial necrotic tissue on the skin  surface.  It looked more like a  chronic than an acute hemorrhoid, and I  talked with them about the two options of either continuing to observe  it versus letting me try and see if I can lance any more clot out.  She  wanted me to go ahead and try to lance it.   So at the bedside, I painted this with Betadine solution.  I infiltrated  it with about 1.5 cc of 1% Xylocaine with epinephrine.  I waited about  90 seconds, then I made a linear incision about 2 cm into the  hemorrhoid.  I did get a small amount of clot out.  I tried to debride  some of the dead skin that had necrosed, maybe the necrosis area was not  more about 5-6 mm in diameter, but I really did not get as much out as I  had hoped to maybe help her.   IMPRESSION:  Thrombosed external hemorrhoid with significant amount of  pain.  She seems to be doing  all the right things already. She is doing  sitz baths, which I encouraged her to do.  She is using topical  ointments, both topical anesthetic ointment, and I encouraged her if she  had a steroid cream, to use also on top of this, which I think may help  some of the inflammation.  I did write her a prescription for Vicodin 20  tablets 1-2 tablets every 6 hours p.r.n. pain with 1 refill.   I left her appointment with [place] open. I told if she continues to have  severe pain, we would be happy to see her back.  I told her this is  liable to continue to hurt for the next couple of days, but hopefully  over a 3-7 day period, will slowly get better.   DIAGNOSES:  1. Thrombosed external hemorrhoid, instructions above.  2. Third trimester pregnancy, due July 25th.  She is having a boy.      Sandria Bales. Kimberly Torres, M.D.  Electronically Signed     DHN/MEDQ  D:  12/05/2007  T:  12/05/2007  Job:  [id number]   cc:   Lendon Colonel, MD   Audrie Gallus. Tower, MD  [address] [place], [place] [address]

## 2010-10-18 ENCOUNTER — Telehealth: Payer: Self-pay | Admitting: *Deleted

## 2010-10-18 NOTE — Telephone Encounter (Signed)
Dr Shirlee Latch reviewed monitor results done 09/28/10-10/15/10. Occasional sinus tachycardia. I gave pt results by telephone.

## 2010-10-20 NOTE — Assessment & Plan Note (Signed)
[facility]                              CARDIOLOGY OFFICE NOTE   Kimberly Torres                    MRN:          [id number]  DATE:02/25/2006                            DOB:          Oct 16, 1980    Kimberly Torres is a very pleasant 30 year old female with a past medical  history of depression, who presents for evaluation of palpitations.  She has  no prior cardiac history.  Over the past 5 to 6 months, she has noticed  occasional palpitations.  They are described as her heart racing.  They  are sudden in onset, and then resolve after about 10 minutes.  There is  chest tightness while she is having the palpitations, but there is no  shortness of breath, presyncope or syncope.  There is no nausea or vomiting.  They are not exertional.  She is having these approximately 2 times per  month.  Because of her palpitations, we were asked to further evaluate.  She  otherwise has not had dyspnea on exertion, orthopnea, PND, presyncope,  syncope or exertional chest pain.   MEDICATIONS:  1. Wellbutrin XL 300 mg p.o. daily.  2. Multivitamin 1 p.o. daily.  3. Concerta birth control.  4. Calcium.  5. She takes ibuprofen as needed.   ALLERGIES:  No known drug allergies.   SOCIAL HISTORY:  She does not smoke.  She occasionally consumes alcohol.  She denies any drug use.   FAMILY HISTORY:  Negative for coronary artery disease.   PAST MEDICAL HISTORY:  There is no diabetes mellitus, hypertension or  hyperlipidemia.  She has had no previous surgeries.  She does have a history  of depression.   REVIEW OF SYSTEMS:  She denies any headaches or fevers or chills.  There is  no productive cough or hemoptysis.  There is no dysphagia or odynophagia,  melena or hematochezia.  There is no dysuria or hematuria.  There is no rash  or seizure activity.  There is no orthopnea, PND or pedal edema.  The  remainder of the systems are negative.   PHYSICAL EXAMINATION:   Shows a blood pressure of 128/80, and her pulse is  116.  She weighs 155 pounds.  She is well-developed and mildly anxious.  She  is in no acute distress.  SKIN:  Warm and dry.  She does not appear to be depressed, and there is no peripheral clubbing.  HEENT:  Unremarkable, with normal eyelids.  NECK:  Supple with normal upstrokes bilaterally, and I cannot appreciate  bruits.  There is no jugular venous distention and no thyromegaly is noted.  CHEST:  Clear to auscultation, normal expansion.  CARDIOVASCULAR:  Exam reveals a regular rate and rhythm, normal S1 and S2.  There are no murmurs, rubs or gallops noted.  ABDOMEN:  Exam nontender, nondistended, positive bowel sounds, no  hepatosplenomegaly, no masses appreciated.  There is no abdominal bruit.  She has 2+ femoral pulses bilaterally, no bruits.  EXTREMITIES:  Show no edema, and I can palpate no cords.  She has 2+  dorsalis pedis pulses bilaterally.  NEUROLOGIC:  Exam is  grossly intact.   Her electrocardiogram shows a sinus rhythm at a rate of 116.  The axis is  normal.  There are no significant ST changes noted.   DIAGNOSES:  1. Palpitations.  2. History of depression.   PLAN:  Ms. Kimberly Torres presents with palpitations that certainly could be a  supraventricular tachycardia.  We will schedule her for a CardioNet monitor  to fully capture her palpitations.  We will also schedule her to have an  echocardiogram.  Note she had a TSH recently in January that was normal at  1.73.  If she indeed has a supraventricular tachycardia, then we will add a  beta blocker and consider referral for ablation if that does not improve her  symptoms.                              Madolyn Frieze Jens Som, MD, [facility]    BSC/MedQ  DD:  02/25/2006  DT:  02/27/2006  Job #:  [id number]   cc:   Marne A. Milinda Antis, MD

## 2010-10-20 NOTE — Discharge Summary (Signed)
Kimberly Torres, Kimberly Torres           ACCOUNT NO.:  [id number]   MEDICAL RECORD NO.:  [id number]          PATIENT TYPE:  INP   LOCATION:  9130                          FACILITY:  [facility]   PHYSICIAN:  Lenoard Aden, M.D.DATE OF BIRTH:  11-21-1980   DATE OF ADMISSION:  12/25/2007  DATE OF DISCHARGE:  12/29/2007                               DISCHARGE SUMMARY   ADMISSION DIAGNOSES:  Active labor and nonreassuring fetal heart  tracing.   DISCHARGE DIAGNOSES:  Active labor and nonreassuring fetal heart  tracing.   The patient underwent uncomplicated primary C-section on December 25, 2007.  Postoperative course uncomplicated.  Ambulated without difficulty.  Discharged to home on hospital day #3.  Discharge teaching done.  Follow  up in the office in 4-6 weeks.  Tylox, prenatal vitamins, and iron  given.      Lenoard Aden, M.D.  Electronically Signed     RJT/MEDQ  D:  02/04/2008  T:  02/05/2008  Job:  [id number]

## 2010-10-20 NOTE — Assessment & Plan Note (Signed)
[facility]                            CARDIOLOGY OFFICE NOTE   Kimberly Torres, Kimberly Torres                    MRN:          [id number]  DATE:05/02/2006                            DOB:          1980-09-05    Kimberly Torres returns for followup.  Please refer to my previous notes  for details.  Since I last saw her, she has continued to have occasional  palpitations, but they are less frequent.  They are sudden in onset and  resolve gradually.  It is described as her heart racing.  There is no  associated chest pain or shortness of breath.  We did perform a  CardioNet monitor, and she has demonstrated what appears to be sinus  tachycardia.   MEDICATIONS AT PRESENT:  1. Wellbutrin XL 300 mg p.o. daily.  2. Multivitamin.  3. Concerta 54 mg p.o. daily.  4. Ortho Tri-Cyclen LO daily.  5. Calcium.   PHYSICAL EXAMINATION TODAY:  VITAL SIGNS:  Blood pressure of 136/84,  pulse 100.  She weighs 157 pounds.  NECK:  Supple.  CHEST:  Clear.  CARDIOVASCULAR:  Regular rate and rhythm.  EXTREMITIES:  No edema.   DIAGNOSES:  1. Tachycardia that appears to be sinus.  2. Continuing palpitations.  3. History of depression.   PLAN:  Kimberly Torres continues to have occasional palpitations, although  less frequent.  Her CardioNet monitor suggests that this is a sinus  tachycardia, although I cannot exclude ectopic atrial tachycardia.  Of  note, her echocardiogram showed normal left ventricular function.  We  will add metoprolol ER 25 mg p.o. daily for symptomatic relief.  Note -  if tries to become pregnant, then we can consider discontinuing this  medication in the future.  I will see her back in 3 months.     Madolyn Frieze Jens Som, MD, [facility]  Electronically Signed    BSC/MedQ  DD: 05/02/2006  DT: 05/02/2006  Job #: [id number]   cc:   Marne A. Milinda Antis, MD

## 2010-10-23 ENCOUNTER — Ambulatory Visit (INDEPENDENT_AMBULATORY_CARE_PROVIDER_SITE_OTHER): Payer: 59 | Admitting: Internal Medicine

## 2010-10-23 ENCOUNTER — Encounter: Payer: Self-pay | Admitting: Internal Medicine

## 2010-10-23 VITALS — BP 110/74 | HR 105 | Ht 68.0 in | Wt 167.8 lb

## 2010-10-23 DIAGNOSIS — R002 Palpitations: Secondary | ICD-10-CM

## 2010-10-23 MED ORDER — PROPRANOLOL HCL 80 MG PO CP24: 80.0000 mg | ORAL_CAPSULE | Freq: Every day | ORAL | Status: DC

## 2010-10-23 MED ORDER — ATENOLOL 50 MG PO TABS: 50.0000 mg | ORAL_TABLET | Freq: Every day | ORAL | Status: DC

## 2010-10-23 MED ORDER — METOPROLOL SUCCINATE ER 50 MG PO TB24: 50.0000 mg | ORAL_TABLET | Freq: Every day | ORAL | Status: DC

## 2010-10-23 MED ORDER — NADOLOL 40 MG PO TABS: 40.0000 mg | ORAL_TABLET | Freq: Every day | ORAL | Status: DC

## 2010-10-23 NOTE — Assessment & Plan Note (Signed)
Patient has significant  tachycardia which appears to be sinus and as such inappropriate.  Hemoglobin and TSH have been unrevealing.  There is clearly any anxiety/depression component to this as well as some potential volume issues and salt depletion.  We have discussed the inter relationship between these periods  She will work to increase her salt and water intake. We have given her a prescription for atenolol metoprolol succinate Inderal and Corgard. She will try them and ran over and let us know if she can tolerate any of them without the fatigue that was seen with metoprolol tartrate.  She will consider behavioural counseling for stress management

## 2010-10-23 NOTE — Patient Instructions (Signed)
Your physician recommends that you schedule a follow-up appointment in: 3 months.  You have been given prescriptions to try in any order in 2 week intervals: 1) Metoprolol succ 50mg  one tablet daily. 2) Atenolol 50mg  one tablet daily. 3) Inderal LA 80mg  one tablet daily. 4) Nadolol 40mg  one tablet daily.

## 2010-10-23 NOTE — Progress Notes (Signed)
HPI: Kimberly Torres is a 30 y.o. female Seen at her request because of recurrent sinus tachycardia.  She had been seen by Dr. Jens Som some years ago for similar complaints that occurred in the setting of significant psychosocial stress. Echo cardiogram at that time was normal. Beta blockers were prescribed or not tolerated because of significant fatigue.  Symptoms subsided for a period of time but in March reemerged coicidnental with a number of major stressors There is associated significant lightheadedness and she found herself in the emergency room in early March. Referred back over here and was seen by Tereso Newcomer. Echo cardiogram demonstrated persistent normal left ventricular function. Her menstrual period in March turned out to be quite heavy.  Overall she does not notice a significant association of her symptoms with her menses. She does not have shower intolerance orthostatic intolerance. There is some impairment of exercise tolerance associated with palpitations. She has not had lightheadedness or syncope. Her diet is salt deplete and volume deplete,  She has anxiety and depression is being followed by psychiatry and this is the most medical management. Counseling and behavioral management has been displaced because of time isues Current Outpatient Prescriptions  Medication Sig Dispense Refill  . ALPRAZolam (XANAX) 0.25 MG tablet Take 0.25 mg by mouth as needed.        . calcium carbonate (OS-CAL) 600 MG TABS Take 600 mg by mouth. Take 1-2 daily       . chlorpheniramine (CHLOR-TRIMETON) 4 MG tablet Take 4 mg by mouth every 6 (six) hours as needed.        . Clindamycin-Benzoyl Per, Refr, (DUAC) gel Apply topically at bedtime.        . clonazePAM (KLONOPIN) 1 MG tablet Take 1 mg by mouth. Take 1 & 1/2 tablets at bedtime       . ibuprofen (ADVIL,MOTRIN) 400 MG tablet Take 400 mg by mouth as needed.        . lamoTRIgine (LAMICTAL) 150 MG tablet Take 150 mg by mouth daily.        Marland Kitchen  tretinoin (ATRALIN) 0.05 % gel Apply topically daily.        Marland Kitchen zolpidem (AMBIEN) 10 MG tablet Take 10 mg by mouth at bedtime as needed.        . doxycycline (ORACEA) 40 MG capsule Take 40 mg by mouth every morning.        . metoprolol tartrate (LOPRESSOR) 25 MG tablet Take 1/2 (half) tablet twice daily prn  60 tablet  11  . DISCONTD: lamoTRIgine (LAMICTAL) 100 MG tablet Take 100 mg by mouth daily.        Marland Kitchen DISCONTD: meloxicam (MOBIC) 15 MG tablet Take 15 mg by mouth daily as needed.          Allergies  Allergen Reactions  . Escitalopram Oxalate     REACTION: hypomania  . Venlafaxine     REACTION: made her worse    Past Medical History  Diagnosis Date  . Depression   . Anxiety   . Chondromalacia of right knee     right  . Sinus tachycardia     eval by Dr. Jens Som in 2007: monior revealed sinus tach. vs EAT; Echo 10/07: EF 70%  . Varicose veins     eval. by vascular surgeon and procedure scheduled to correct    Past Surgical History  Procedure Date  . Myringotomy   . Cesarean section 2009  . Knee surgery 04/10/2010    Family History  Problem Relation Age of Onset  . Coronary artery disease      grandfather  . Heart attack      grandfather  . Breast cancer      grandmother  . Bipolar disorder      grandmother    History   Social History  . Marital Status: Married    Spouse Name: N/A    Number of Children: N/A  . Years of Education: N/A   Occupational History  . pharmacist [employer]   Social History Main Topics  . Smoking status: Never Smoker   . Smokeless tobacco: Never Used  . Alcohol Use: Yes     occasional to rare  . Drug Use: No  . Sexually Active: Not on file   Other Topics Concern  . Not on file   Social History Narrative   Family History:GF CAD with MI in his 41sMGM breast cancer in her 26s GM with bipolarSocial History:Marital Status: MarriedChildren: 1Occupation: pharmacistHer husband is a Technical sales engineer    Fourteen point review of  systems was negative except as noted in HPI and PMH   PHYSICAL EXAMINATION  Blood pressure 110/74, pulse 105, height 5\' 8"  (1.727 m), weight 167 lb 12.8 oz (76.114 kg).   Well developed and nourished Caucasian female appearing her stated agein no acute distress HENT normal Neck supple with JVP-flat Carotids brisk and full without bruits Back without scoliosis or kyphosis Clear Regular rate and rhythm, no murmurs or gallops. Heart rate varied appropriately with inspiration Abd-soft with active BS without hepatomegaly or midline pulsation Femoral pulses 2+ distal pulses intact No Clubbing cyanosis edema Skin-warm and dry LN-neg submandibular and supraclavicular A & Oriented CN 3-12 normal  Grossly normal sensory and motor function Affect engaging . Event recorder demonstrated numerous episodes of tachycardia with heart rates in the 130s. All appear to be sinus with gradual onset and all set

## 2011-01-30 ENCOUNTER — Ambulatory Visit: Payer: 59 | Admitting: Internal Medicine

## 2011-02-26 LAB — URINALYSIS, ROUTINE W REFLEX MICROSCOPIC
Glucose, UA: NEGATIVE
Ketones, ur: NEGATIVE
Nitrite: NEGATIVE
Specific Gravity, Urine: 1.02
pH: 6

## 2011-03-02 LAB — CBC
HCT: 31.4 — ABNORMAL LOW
HCT: 32.3 — ABNORMAL LOW
HCT: 34.8 — ABNORMAL LOW
Hemoglobin: 10.9 — ABNORMAL LOW
Hemoglobin: 11.8 — ABNORMAL LOW
MCHC: 33.8
MCHC: 34.2
MCV: 91.8
MCV: 92.3
Platelets: 187
RBC: 3.5 — ABNORMAL LOW
RBC: 3.78 — ABNORMAL LOW
RDW: 13.7
RDW: 13.9
WBC: 14.2 — ABNORMAL HIGH

## 2011-03-03 IMAGING — CR DG KNEE COMPLETE 4+V*R*
4 series · 4 of 4 positions shown · non-contrast
Comparison: None.

CLINICAL DATA: Medial pain

RIGHT KNEE - COMPLETE 4+ VIEW

[view not recorded (1 of 4)]
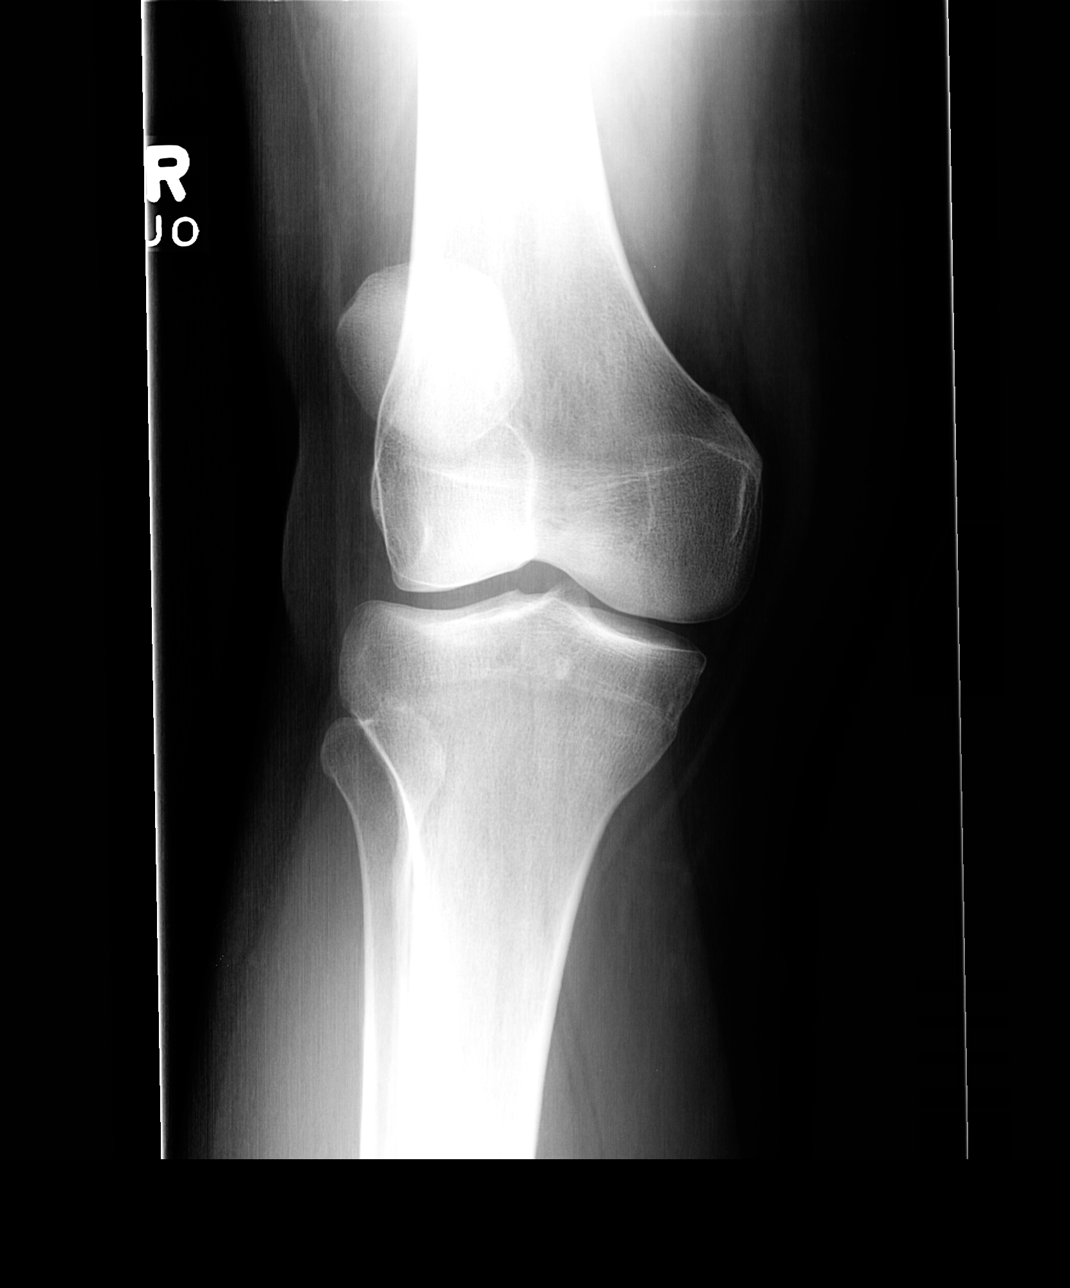

[view not recorded (2 of 4)]
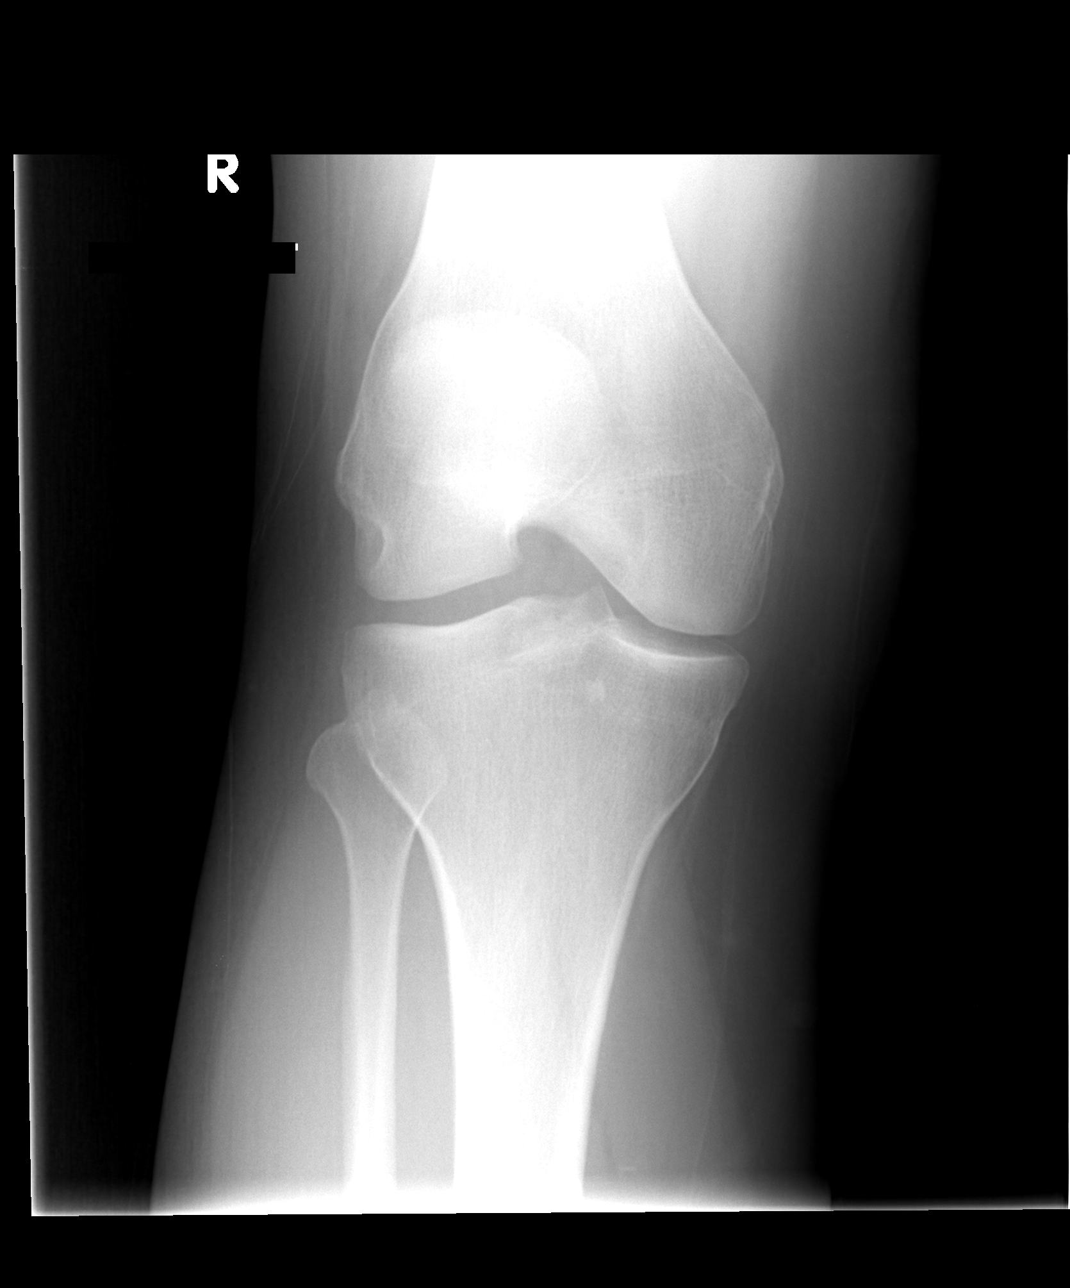

[view not recorded (3 of 4)]
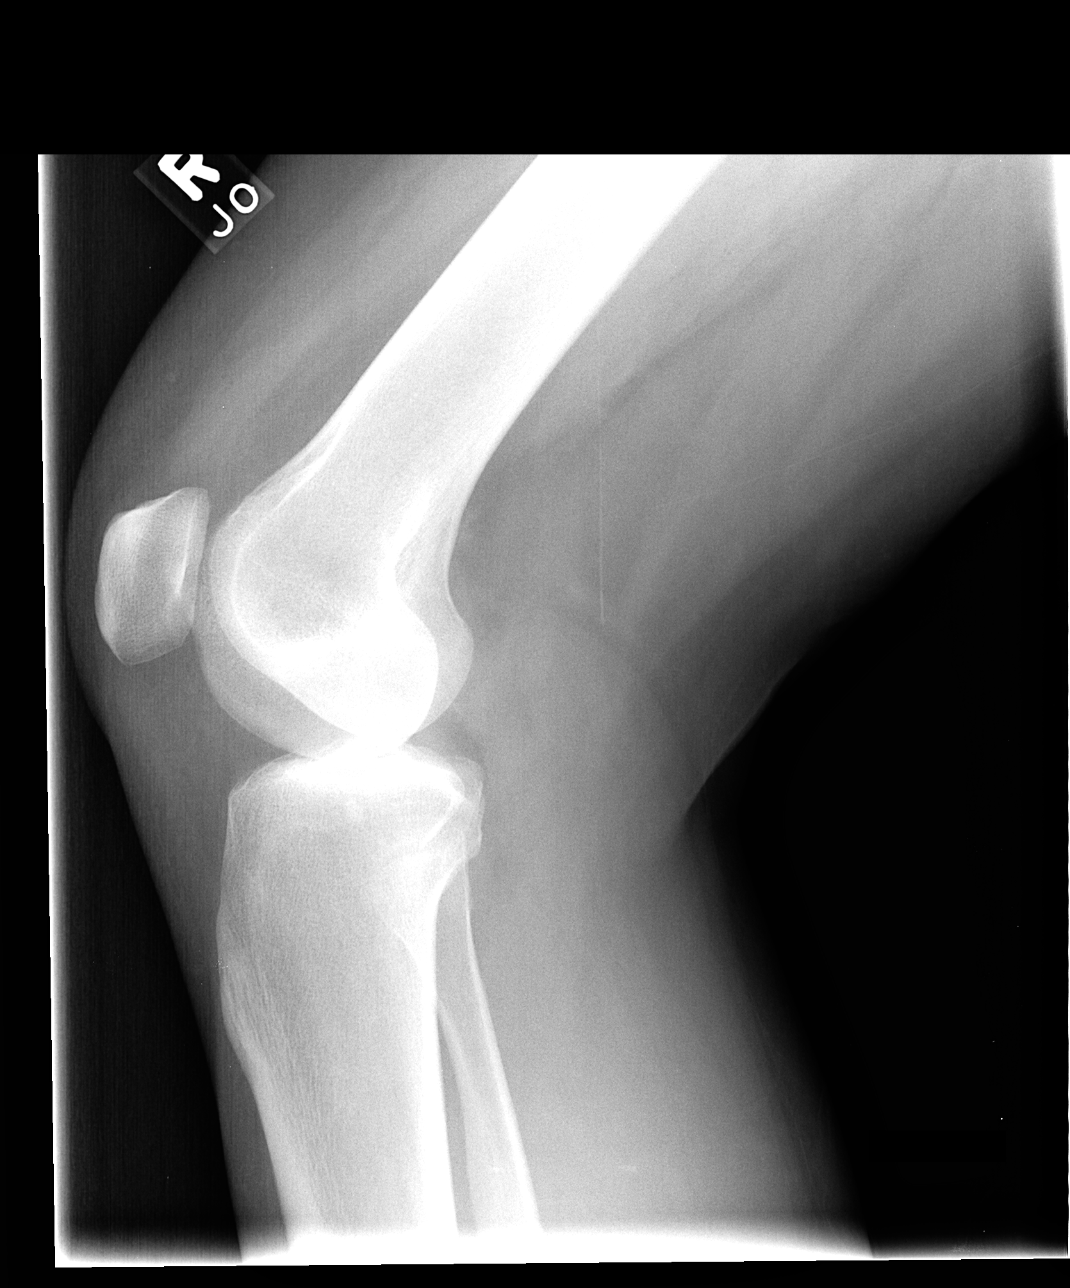

[view not recorded (4 of 4)]
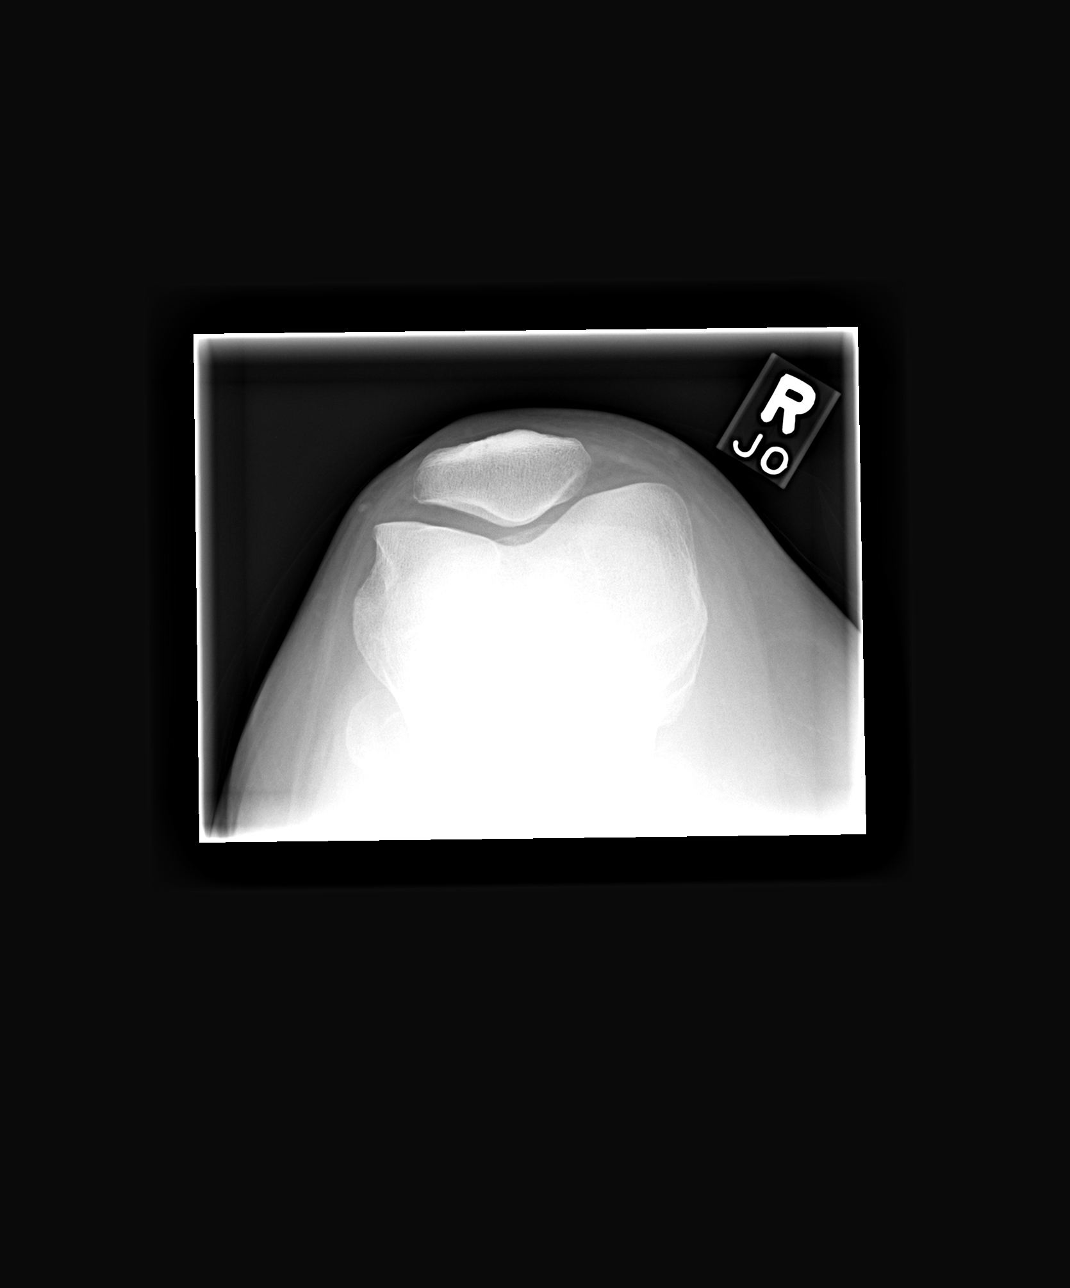

[4 of 4 positions shown; findings below may reference images not displayed]

FINDINGS: There is only slight decrease in medial joint space with
mild sclerosis.  No fracture is seen and no effusion is noted.  The
patella is minimally medially positioned within the patellofemoral
groove.
IMPRESSION: 1.  No fracture.  No effusion.
2.  Slight decrease in the medial joint space.

## 2011-05-10 ENCOUNTER — Emergency Department (INDEPENDENT_AMBULATORY_CARE_PROVIDER_SITE_OTHER)
Admission: EM | Admit: 2011-05-10 | Discharge: 2011-05-10 | Disposition: A | Discharge status: Home / Self Care | Payer: 59 | Source: Home / Self Care | Attending: Family Medicine | Admitting: Family Medicine

## 2011-05-10 ENCOUNTER — Encounter (HOSPITAL_COMMUNITY): Payer: Self-pay

## 2011-05-10 DIAGNOSIS — R6889 Other general symptoms and signs: Secondary | ICD-10-CM

## 2011-05-10 HISTORY — DX: Insomnia, unspecified: G47.00

## 2011-05-10 MED ORDER — HYDROCOD POLST-CHLORPHEN POLST 10-8 MG/5ML PO LQCR: 5.0000 mL | Freq: Two times a day (BID) | ORAL | Status: DC | PRN

## 2011-05-10 NOTE — ED Notes (Signed)
C/o scratchy throat, productive cough, chest congestion, body aches and laryngitis.- Sx started yesterday

## 2011-05-10 NOTE — ED Provider Notes (Signed)
History     CSN: [id number] Arrival date & time: 05/10/2011  8:19 AM   First MD Initiated Contact with Patient 05/10/11 [phone]      Chief Complaint  Patient presents with  . Sore Throat  . Cough    (Consider location/radiation/quality/duration/timing/severity/associated sxs/prior treatment) HPI Comments: Kimberly Sheldon Lanora Manis) presents for evaluation of persistent cough, myalgias, fever since yesterday. She works as a Teacher, early years/pre in [employer] She also reports laryngitis and hoarseness. The cough is not productive and she is now feeling chest congestion.   Patient is a 30 y.o. female presenting with cough. The history is provided by the patient.  Cough This is a new problem. The current episode started yesterday. The problem occurs constantly. The problem has not changed since onset.The cough is non-productive. The maximum temperature recorded prior to her arrival was 100 to 100.9 F. The fever has been present for 1 to 2 days. Associated symptoms include ear congestion, ear pain, sore throat and myalgias. Pertinent negatives include no chills and no sweats. She has tried cough syrup for the symptoms. The treatment provided no relief. She is not a smoker.    Past Medical History  Diagnosis Date  . Depression   . Anxiety   . Chondromalacia of right knee     right  . Sinus tachycardia     eval by Dr. Jens Som in 2007: monior revealed sinus tach. vs EAT; Echo 10/07: EF 70%  . Varicose veins     eval. by vascular surgeon and procedure scheduled to correct  . Insomnia     Past Surgical History  Procedure Date  . Myringotomy   . Cesarean section 2009  . Knee surgery 04/10/2010    Family History  Problem Relation Age of Onset  . Coronary artery disease      grandfather  . Heart attack      grandfather  . Breast cancer      grandmother  . Bipolar disorder      grandmother    History  Substance Use Topics  . Smoking status: Never Smoker   . Smokeless tobacco:  Never Used  . Alcohol Use: Yes     occasional to rare    OB History    Grav Para Term Preterm Abortions TAB SAB Ect Mult Living                  Review of Systems  Constitutional: Positive for fever. Negative for chills.  HENT: Positive for ear pain, congestion and sore throat.   Eyes: Negative.   Respiratory: Positive for cough.   Cardiovascular: Negative.   Gastrointestinal: Negative.   Genitourinary: Negative.   Musculoskeletal: Positive for myalgias.  Skin: Negative.     Allergies  Escitalopram oxalate and Venlafaxine  Home Medications   Current Outpatient Rx  Name Route Sig Dispense Refill  . CHLORPHENIRAMINE MALEATE 4 MG PO TABS Oral Take 4 mg by mouth every 6 (six) hours as needed.      Marland Kitchen CLONAZEPAM 1 MG PO TABS Oral Take 1 mg by mouth. Take 1 & 1/2 tablets at bedtime     . IBUPROFEN 400 MG PO TABS Oral Take 400 mg by mouth as needed.      Marland Kitchen LAMOTRIGINE 150 MG PO TABS Oral Take 150 mg by mouth daily.      Marland Kitchen ZOLPIDEM TARTRATE 10 MG PO TABS Oral Take 10 mg by mouth at bedtime as needed.      . ALPRAZOLAM 0.25 MG PO  TABS Oral Take 0.25 mg by mouth as needed.      . ATENOLOL 50 MG PO TABS Oral Take 1 tablet (50 mg total) by mouth daily. 14 tablet 0  . CALCIUM CARBONATE 600 MG PO TABS Oral Take 600 mg by mouth. Take 1-2 daily     . CLINDAMYCIN PHOS-BENZOYL PEROX 1.2-5 % EX GEL Topical Apply topically at bedtime.      Marland Kitchen DOXYCYCLINE (ROSACEA) 40 MG PO CPDR Oral Take 40 mg by mouth every morning.      Marland Kitchen METOPROLOL SUCCINATE ER 50 MG PO TB24 Oral Take 1 tablet (50 mg total) by mouth daily. 14 tablet 0  . NADOLOL 40 MG PO TABS Oral Take 1 tablet (40 mg total) by mouth daily. 14 tablet 0  . PROPRANOLOL HCL 80 MG PO CP24 Oral Take 1 capsule (80 mg total) by mouth daily. 14 capsule 0  . TRETINOIN 0.05 % EX GEL Topical Apply topically daily.        BP 118/71  Pulse 103  Temp(Src) 97.5 F (36.4 C) (Oral)  Resp 18  SpO2 100%  LMP 04/30/2011  Physical Exam    Constitutional: She is oriented to person, place, and time. She appears well-developed and well-nourished.  HENT:  Head: Normocephalic and atraumatic.  Eyes: EOM are normal. Pupils are equal, round, and reactive to light.  Neck: Normal range of motion.  Cardiovascular: Normal rate and regular rhythm.   Pulmonary/Chest: Effort normal and breath sounds normal. She has no wheezes.  Neurological: She is alert and oriented to person, place, and time.  Skin: Skin is warm and dry.    ED Course  Procedures (including critical care time)  Labs Reviewed - No data to display No results found.   No diagnosis found.    MDM          Richardo Priest, MD 05/10/11 [phone]

## 2011-07-17 ENCOUNTER — Telehealth: Payer: Self-pay | Admitting: Family Medicine

## 2011-07-17 MED ORDER — CIPROFLOXACIN HCL 500 MG PO TABS: 500.0000 mg | ORAL_TABLET | Freq: Two times a day (BID) | ORAL | Status: AC

## 2011-07-17 MED ORDER — ONDANSETRON HCL 8 MG PO TABS: 8.0000 mg | ORAL_TABLET | Freq: Three times a day (TID) | ORAL | Status: AC | PRN

## 2011-07-17 NOTE — Telephone Encounter (Signed)
Patient notified as instructed by telephone. Pt said pharmacy already contacted her to let her know it was ready.

## 2011-07-17 NOTE — Telephone Encounter (Signed)
Sent elect - let her know

## 2011-07-17 NOTE — Telephone Encounter (Signed)
Triage Record Num: [id number] Operator: Neita Goodnight Patient Name: Kimberly Torres Call Date & Time: 07/17/2011 12:29:03PM Patient Phone: [phone] PCP: Idamae Schuller A. Tower Caller Name: Ambert Virrueta Relationship to Patient: Unknown Patient Gender: Female PCP Fax : Patient DOB: 1981/03/03 Practice Name: [facility] Reason for Call: Caller: Watts/spouse. Will be travelling to [place] on 02/20, is requesting Rx for Cipro and Zofran to take with her in case they are needed. OFFICE PLEASE CALL WATTS AT [phone] AND ADVISE IF RX HAS BEEN CALLED IN. OUTPATIENT PHARM AT [facility], [phone]. Protocol(s) Used: Office Note Recommended Outcome per Protocol: Information Noted and Sent to Office Reason for Outcome: Caller information to office Care Advice: ~ 07/17/2011 12:36:58PM Page 1 of 1 [id number]

## 2011-09-14 ENCOUNTER — Ambulatory Visit: Payer: Self-pay | Admitting: Otolaryngology

## 2011-09-24 ENCOUNTER — Other Ambulatory Visit: Payer: 59

## 2011-09-24 ENCOUNTER — Ambulatory Visit: Payer: Self-pay | Admitting: Otolaryngology

## 2011-10-01 ENCOUNTER — Encounter: Payer: 59 | Admitting: Family Medicine

## 2011-12-11 ENCOUNTER — Ambulatory Visit: Payer: Self-pay | Admitting: Pain Medicine

## 2012-01-28 ENCOUNTER — Telehealth: Payer: Self-pay | Admitting: Family Medicine

## 2012-01-28 DIAGNOSIS — Z113 Encounter for screening for infections with a predominantly sexual mode of transmission: Secondary | ICD-10-CM

## 2012-01-28 DIAGNOSIS — Z0000 Encounter for general adult medical examination without abnormal findings: Secondary | ICD-10-CM

## 2012-01-28 NOTE — Telephone Encounter (Signed)
I ordered wellness labs Also gc/ chlamydia screen (urine) and HIV and RPR (syphillis)- just so she knows what I ordered for future labs thanks

## 2012-01-28 NOTE — Addendum Note (Signed)
Addended by: Roxy Manns A on: 01/28/2012 05:19 PM   Modules accepted: Orders

## 2012-01-28 NOTE — Telephone Encounter (Signed)
Patient is coming in for lab work for her cpx in 2 weeks.  Patient wanted to know if she could have some tests added.  Patient said she needs lab work for STD and HIV.  Patient will call in the morning before her appointment to make sure it has been approved.

## 2012-01-28 NOTE — Telephone Encounter (Signed)
I will go ahead and add labs-thanks for the heads up

## 2012-01-29 ENCOUNTER — Other Ambulatory Visit (INDEPENDENT_AMBULATORY_CARE_PROVIDER_SITE_OTHER): Payer: 59

## 2012-01-29 DIAGNOSIS — Z113 Encounter for screening for infections with a predominantly sexual mode of transmission: Secondary | ICD-10-CM

## 2012-01-29 DIAGNOSIS — Z0000 Encounter for general adult medical examination without abnormal findings: Secondary | ICD-10-CM

## 2012-01-29 LAB — COMPREHENSIVE METABOLIC PANEL
ALT: 13 U/L (ref 0–35)
AST: 16 U/L (ref 0–37)
Alkaline Phosphatase: 41 U/L (ref 39–117)
BUN: 12 mg/dL (ref 6–23)
Calcium: 9.3 mg/dL (ref 8.4–10.5)
Chloride: 104 mEq/L (ref 96–112)
Creatinine, Ser: 1 mg/dL (ref 0.4–1.2)
Total Bilirubin: 0.9 mg/dL (ref 0.3–1.2)

## 2012-01-29 LAB — CBC WITH DIFFERENTIAL/PLATELET
Basophils Absolute: 0 10*3/uL (ref 0.0–0.1)
Basophils Relative: 0.5 % (ref 0.0–3.0)
Eosinophils Absolute: 0 10*3/uL (ref 0.0–0.7)
HCT: 39.9 % (ref 36.0–46.0)
Hemoglobin: 13.3 g/dL (ref 12.0–15.0)
Lymphs Abs: 2.2 10*3/uL (ref 0.7–4.0)
MCHC: 33.2 g/dL (ref 30.0–36.0)
MCV: 93.6 fl (ref 78.0–100.0)
Monocytes Absolute: 0.4 10*3/uL (ref 0.1–1.0)
Neutro Abs: 3.7 10*3/uL (ref 1.4–7.7)
RBC: 4.27 Mil/uL (ref 3.87–5.11)
RDW: 12.2 % (ref 11.5–14.6)

## 2012-01-29 LAB — LIPID PANEL
HDL: 60.8 mg/dL (ref >39.00)
Total CHOL/HDL Ratio: 2

## 2012-01-29 LAB — TSH: TSH: 2.12 u[IU]/mL (ref 0.35–5.50)

## 2012-01-29 NOTE — Telephone Encounter (Signed)
Patient notified

## 2012-01-30 LAB — GC/CHLAMYDIA PROBE AMP, URINE: Chlamydia, Swab/Urine, PCR: NEGATIVE

## 2012-01-30 LAB — HIV ANTIBODY (ROUTINE TESTING W REFLEX): HIV: NONREACTIVE

## 2012-01-30 LAB — RPR

## 2012-02-05 ENCOUNTER — Telehealth: Payer: Self-pay | Admitting: Family Medicine

## 2012-02-05 DIAGNOSIS — Z0000 Encounter for general adult medical examination without abnormal findings: Secondary | ICD-10-CM

## 2012-02-05 NOTE — Telephone Encounter (Signed)
Message copied by Judy Pimple on Tue Feb 05, 2012  8:59 PM ------      Message from: Baldomero Lamy      Created: Mon Jan 28, 2012  9:57 AM      Regarding: Cpx labs Wed 9/4       Please order  future cpx labs for pt's upcomming lab appt.      Thanks      Rodney Booze

## 2012-02-06 ENCOUNTER — Other Ambulatory Visit: Payer: 59

## 2012-02-15 ENCOUNTER — Encounter: Payer: Self-pay | Admitting: Family Medicine

## 2012-02-15 ENCOUNTER — Ambulatory Visit (INDEPENDENT_AMBULATORY_CARE_PROVIDER_SITE_OTHER): Payer: 59 | Admitting: Family Medicine

## 2012-02-15 VITALS — BP 120/84 | HR 113 | Temp 97.8°F | Ht 68.0 in | Wt 124.2 lb

## 2012-02-15 DIAGNOSIS — G43809 Other migraine, not intractable, without status migrainosus: Secondary | ICD-10-CM

## 2012-02-15 DIAGNOSIS — Z0000 Encounter for general adult medical examination without abnormal findings: Secondary | ICD-10-CM

## 2012-02-15 DIAGNOSIS — G44009 Cluster headache syndrome, unspecified, not intractable: Secondary | ICD-10-CM | POA: Insufficient documentation

## 2012-02-15 DIAGNOSIS — R634 Abnormal weight loss: Secondary | ICD-10-CM

## 2012-02-15 DIAGNOSIS — Z113 Encounter for screening for infections with a predominantly sexual mode of transmission: Secondary | ICD-10-CM

## 2012-02-15 DIAGNOSIS — G43009 Migraine without aura, not intractable, without status migrainosus: Secondary | ICD-10-CM

## 2012-02-15 NOTE — Progress Notes (Signed)
Subjective:    Patient ID: Kimberly Torres, female    DOB: 14-Aug-1980, 31 y.o.   MRN: [id number]  HPI Here for health maintenance exam and to review chronic medical problems    Lost 43 lb since last visit  (gained 6 lb back) bmi is 18 now   Had a difficult year Trigeminal pain/ dx with atypical migraine with ear pain - pretty severe at times / with vomiting  This happens on and off  Is seeing Dr Lovey Newcomer ENT, has also been to [facility] imitrex seems to be helping She has f/u planned  Not eating very much due to this and stress also   Working more and pt load is worse  Not taking good card of herself in general  Has counselor appt soon - first visit   Also had strep throat 3 times   Gyn care Sees gyn- last pap nov 2012  Flu shot - [facility] to [facility]   Pt had [place] do std screening - was all normal  She had a scare- some marital issues      Chemistry      Component Value Date/Time   NA 138 01/29/2012 1012   K 4.0 01/29/2012 1012   CL 104 01/29/2012 1012   CO2 26 01/29/2012 1012   BUN 12 01/29/2012 1012   CREATININE 1.0 01/29/2012 1012      Component Value Date/Time   CALCIUM 9.3 01/29/2012 1012   ALKPHOS 41 01/29/2012 1012   AST 16 01/29/2012 1012   ALT 13 01/29/2012 1012   BILITOT 0.9 01/29/2012 1012     Lab Results  Component Value Date   WBC 6.4 01/29/2012   HGB 13.3 01/29/2012   HCT 39.9 01/29/2012   MCV 93.6 01/29/2012   PLT 276.0 01/29/2012   Lab Results  Component Value Date   TSH 2.12 01/29/2012   Lab Results  Component Value Date   CHOL 143 01/29/2012   CHOL 141 04/03/2010   CHOL 139 04/15/2009   Lab Results  Component Value Date   HDL 60.80 01/29/2012   HDL 91.47 04/03/2010   HDL 41.90 04/15/2009   Lab Results  Component Value Date   LDLCALC 68 01/29/2012   LDLCALC 76 04/03/2010   LDLCALC 87 04/15/2009   Lab Results  Component Value Date   TRIG 73.0 01/29/2012   TRIG 68.0 04/03/2010   TRIG 49.0 04/15/2009   Lab Results    Component Value Date   CHOLHDL 2 01/29/2012   CHOLHDL 3 04/03/2010   CHOLHDL 3 04/15/2009   No results found for this basename: LDLDIRECT       Review of Systems Review of Systems  Constitutional: Negative for fever, appetite change, fatigue and unexpected weight change.  Eyes: Negative for pain and visual disturbance.  ENt pos for intermittent facial/ headache type pain Respiratory: Negative for cough and shortness of breath.   Cardiovascular: Negative for cp or palpitations    Gastrointestinal: Negative for nausea, diarrhea and constipation.  Genitourinary: Negative for urgency and frequency.  Skin: Negative for pallor or rash   Neurological: Negative for weakness, light-headedness, numbness and pos for headaches Hematological: Negative for adenopathy. Does not bruise/bleed easily.  Psychiatric/Behavioral: Negative for dysphoric mood. The patient is not nervous/anxious.         Objective:   Physical Exam  Constitutional: She appears well-developed and well-nourished. No distress.       Underweight Well appearing but fatigued and stressed   HENT:  Head: Normocephalic and atraumatic.  Right Ear: External ear normal.  Left Ear: External ear normal.  Nose: Nose normal.  Mouth/Throat: Oropharynx is clear and moist. No oropharyngeal exudate.  Eyes: Conjunctivae normal and EOM are normal. Pupils are equal, round, and reactive to light. Right eye exhibits no discharge. Left eye exhibits no discharge. No scleral icterus.  Neck: Normal range of motion. Neck supple. No JVD present. Carotid bruit is not present. No tracheal deviation present. No thyromegaly present.  Cardiovascular: Normal rate, regular rhythm, normal heart sounds and intact distal pulses.  Exam reveals no gallop.   Pulmonary/Chest: Effort normal and breath sounds normal. No respiratory distress. She has no wheezes.  Abdominal: Soft. Bowel sounds are normal. She exhibits no distension and no mass. There is no  tenderness.  Musculoskeletal: Normal range of motion. She exhibits no edema and no tenderness.  Lymphadenopathy:    She has no cervical adenopathy.  Neurological: She is alert. She has normal reflexes. No cranial nerve deficit. She exhibits normal muscle tone. Coordination normal.  Skin: Skin is warm and dry. No rash noted. No erythema. No pallor.  Psychiatric: She has a normal mood and affect.          Assessment & Plan:

## 2012-02-15 NOTE — Patient Instructions (Addendum)
Make sure to get your flu shot  Continue current medicines  Try to keep sleep and wake times the same, drink enough water, avoid caffine, work on the stress issues  Take care of yourself Use supplements (ensure/ boost) to get in extra calories

## 2012-02-15 NOTE — Assessment & Plan Note (Signed)
Neg screening - this is reassuring Pt will see her gyn for gyn care in nov No symptoms

## 2012-02-15 NOTE — Assessment & Plan Note (Signed)
With ear/ trigeminal pain  Seeing ENT-valtrex is helping and to see psych counselor also - think that will be helpful Disc migraine triggers and lifestyle change today

## 2012-02-15 NOTE — Assessment & Plan Note (Signed)
This is from her pain issues from atypical migraine and also stress Disc need for more calories- made plan [id number] calories  Pt has no s/s of body/eating disorder

## 2012-02-15 NOTE — Assessment & Plan Note (Signed)
Reviewed health habits including diet and exercise and skin cancer prevention Also reviewed health mt list, fam hx and immunizations  Rev wellness labs in detail Disc strategies to get in more calories

## 2012-04-07 ENCOUNTER — Telehealth: Payer: Self-pay

## 2012-04-07 NOTE — Telephone Encounter (Signed)
Pt request access code to my chart; pt saw Dr Milinda Antis couple of days before my chart started. Pt to call [phone]. Pt voiced understanding.

## 2012-04-23 ENCOUNTER — Emergency Department (HOSPITAL_COMMUNITY)
Admission: EM | Admit: 2012-04-23 | Discharge: 2012-04-23 | Disposition: A | Discharge status: Home / Self Care | Payer: 59 | Source: Home / Self Care | Attending: Family Medicine | Admitting: Family Medicine

## 2012-04-23 ENCOUNTER — Encounter (HOSPITAL_COMMUNITY): Payer: Self-pay | Admitting: *Deleted

## 2012-04-23 DIAGNOSIS — J019 Acute sinusitis, unspecified: Secondary | ICD-10-CM

## 2012-04-23 MED ORDER — AMOXICILLIN-POT CLAVULANATE 875-125 MG PO TABS: 1.0000 | ORAL_TABLET | Freq: Two times a day (BID) | ORAL | Status: DC

## 2012-04-23 MED ORDER — IPRATROPIUM BROMIDE 0.06 % NA SOLN: 2.0000 | Freq: Four times a day (QID) | NASAL | Status: DC

## 2012-04-23 NOTE — ED Provider Notes (Signed)
History     CSN: [id number]  Arrival date & time 04/23/12  1339   First MD Initiated Contact with Patient 04/23/12 1442      Chief Complaint  Patient presents with  . Cough    (Consider location/radiation/quality/duration/timing/severity/associated sxs/prior treatment) Patient is a 31 y.o. female presenting with URI. The history is provided by the patient.  URI The primary symptoms include fever, ear pain, sore throat and cough. Primary symptoms do not include wheezing, nausea or vomiting. The problem has not changed since onset. Symptoms associated with the illness include plugged ear sensation, sinus pressure, congestion and rhinorrhea. The illness is not associated with chills.    Past Medical History  Diagnosis Date  . Depression   . Anxiety   . Chondromalacia of right knee     right  . Sinus tachycardia     eval by Dr. Jens Som in 2007: monior revealed sinus tach. vs EAT; Echo 10/07: EF 70%  . Varicose veins     eval. by vascular surgeon and procedure scheduled to correct  . Insomnia     Past Surgical History  Procedure Date  . Myringotomy   . Cesarean section 2009  . Knee surgery 04/10/2010    Family History  Problem Relation Age of Onset  . Coronary artery disease      grandfather  . Heart attack      grandfather  . Breast cancer      grandmother  . Bipolar disorder      grandmother    History  Substance Use Topics  . Smoking status: Never Smoker   . Smokeless tobacco: Never Used  . Alcohol Use: Yes     Comment: occasional to rare    OB History    Grav Para Term Preterm Abortions TAB SAB Ect Mult Living                  Review of Systems  Constitutional: Positive for fever. Negative for chills.  HENT: Positive for ear pain, congestion, sore throat, rhinorrhea and sinus pressure.   Respiratory: Positive for cough. Negative for wheezing.   Cardiovascular: Negative.   Gastrointestinal: Negative.  Negative for nausea and vomiting.     Allergies  Escitalopram oxalate and Venlafaxine  Home Medications   Current Outpatient Rx  Name  Route  Sig  Dispense  Refill  . VERAPAMIL HCL 120 MG PO TABS   Oral   Take 120 mg by mouth 3 (three) times daily.         Marland Kitchen ALPRAZOLAM 0.25 MG PO TABS   Oral   Take 0.25 mg by mouth as needed.           . AMOXICILLIN-POT CLAVULANATE 875-125 MG PO TABS   Oral   Take 1 tablet by mouth 2 (two) times daily.   20 tablet   0   . ATENOLOL 50 MG PO TABS   Oral   Take 1 tablet (50 mg total) by mouth daily.   14 tablet   0   . CALCIUM CARBONATE 600 MG PO TABS   Oral   Take 600 mg by mouth. Take 1-2 daily          . CLINDAMYCIN PHOS-BENZOYL PEROX 1.2-5 % EX GEL   Topical   Apply topically at bedtime.           Marland Kitchen CLONAZEPAM 1 MG PO TABS   Oral   Take 1 mg by mouth. Take 1 & 1/2 tablets at bedtime          .  DOXYCYCLINE (ROSACEA) 40 MG PO CPDR   Oral   Take 40 mg by mouth every morning.           . IBUPROFEN 400 MG PO TABS   Oral   Take 400 mg by mouth as needed.           . IPRATROPIUM BROMIDE 0.06 % NA SOLN   Nasal   Place 2 sprays into the nose 4 (four) times daily.   15 mL   1   . LAMOTRIGINE 150 MG PO TABS   Oral   Take 150 mg by mouth daily.           Marland Kitchen METOPROLOL SUCCINATE ER 50 MG PO TB24   Oral   Take 1 tablet (50 mg total) by mouth daily.   14 tablet   0   . NADOLOL 40 MG PO TABS   Oral   Take 1 tablet (40 mg total) by mouth daily.   14 tablet   0   . NORETHIN-ETH ESTRAD-FE BIPHAS 1 MG-10 MCG / 10 MCG PO TABS   Oral   Take 1 tablet by mouth daily.         Marland Kitchen CITRACAL PRENATAL + DHA PO   Oral   Take by mouth daily.         Marland Kitchen PROPRANOLOL HCL 80 MG PO CP24   Oral   Take 1 capsule (80 mg total) by mouth daily.   14 capsule   0   . SUMATRIPTAN SUCCINATE 6 MG/0.5ML Rosita SOLN   Subcutaneous   Inject 4 mg into the skin every 2 (two) hours as needed. F         . TRETINOIN 0.05 % EX GEL   Topical   Apply topically daily.            Marland Kitchen ZOLPIDEM TARTRATE 10 MG PO TABS   Oral   Take 10 mg by mouth at bedtime as needed.             BP 123/79  Pulse 106  Temp 99 F (37.2 C) (Oral)  Resp 18  SpO2 100%  LMP 03/18/2012  Physical Exam  Nursing note and vitals reviewed. Constitutional: She is oriented to person, place, and time. She appears well-developed and well-nourished.  HENT:  Head: Normocephalic.  Right Ear: Hearing, tympanic membrane and external ear normal.  Left Ear: Hearing and ear canal normal. Tympanic membrane is injected and erythematous. A middle ear effusion is present.  Eyes: Pupils are equal, round, and reactive to light.  Neck: Normal range of motion. Neck supple.  Cardiovascular: Normal rate, regular rhythm, normal heart sounds and intact distal pulses.   Pulmonary/Chest: Effort normal and breath sounds normal.  Neurological: She is alert and oriented to person, place, and time.  Skin: Skin is warm and dry.    ED Course  Procedures (including critical care time)  Labs Reviewed - No data to display No results found.   1. Sinusitis, acute       MDM          Linna Hoff, MD 04/23/12 [phone]

## 2012-04-23 NOTE — ED Notes (Signed)
Pt  Reports  Symptoms  Of  Cough   /  Congested      With  Sinus  Drainage  And  Sensation of  Fullness  In   Ears      X  2  Weeks       Symptoms  Lingering    - pt  Is  Sitting upright on  Exam table  Speaking in  Complete  sentances

## 2012-04-30 ENCOUNTER — Ambulatory Visit (INDEPENDENT_AMBULATORY_CARE_PROVIDER_SITE_OTHER)
Admission: RE | Admit: 2012-04-30 | Discharge: 2012-04-30 | Disposition: A | Discharge status: Home / Self Care | Payer: 59 | Source: Ambulatory Visit | Attending: Family Medicine | Admitting: Family Medicine

## 2012-04-30 ENCOUNTER — Ambulatory Visit (INDEPENDENT_AMBULATORY_CARE_PROVIDER_SITE_OTHER): Payer: 59 | Admitting: Family Medicine

## 2012-04-30 ENCOUNTER — Encounter: Payer: Self-pay | Admitting: Family Medicine

## 2012-04-30 VITALS — BP 114/68 | HR 114 | Temp 98.2°F | Ht 68.0 in | Wt 140.2 lb

## 2012-04-30 DIAGNOSIS — J069 Acute upper respiratory infection, unspecified: Secondary | ICD-10-CM

## 2012-04-30 DIAGNOSIS — N926 Irregular menstruation, unspecified: Secondary | ICD-10-CM

## 2012-04-30 LAB — POCT URINE PREGNANCY: Preg Test, Ur: NEGATIVE

## 2012-04-30 MED ORDER — ALBUTEROL SULFATE HFA 108 (90 BASE) MCG/ACT IN AERS: 2.0000 | INHALATION_SPRAY | RESPIRATORY_TRACT | Status: DC | PRN

## 2012-04-30 MED ORDER — HYDROCODONE-HOMATROPINE 5-1.5 MG/5ML PO SYRP: 5.0000 mL | ORAL_SOLUTION | Freq: Three times a day (TID) | ORAL | Status: DC | PRN

## 2012-04-30 NOTE — Assessment & Plan Note (Signed)
With a sinus infection - treating with augmentin Reassuring exam - but with continued intermittent fever -will check cxr  Given hycodan cough med to use with caution Ventolin refil  Update if not starting to improve in a week or if worsening   Disc symptomatic care - see instructions on AVS

## 2012-04-30 NOTE — Progress Notes (Signed)
Subjective:    Patient ID: Kimberly Torres, female    DOB: 28-Apr-1981, 31 y.o.   MRN: [id number]  HPI Here with uri symptoms Over 3 weeks  Started with cold symptoms  Has had a fever on and off -- taking motrin   A lot of sinus pressure and pain - above the eyes - and hurts to bend over  Cough - has become worse - hard to stop coughing (some wheezing at night)  Chest is sore from coughing  occ productive - not improved with mucinex Not sleeping  Ears are bothering her - full Throat is ok   Nasal cong- yellow Cough is not very prod - but rattly   LMP was oct 15th  Home preg test negative  On OC -has not skipped any doses  Has skipped periods in the past  Her weight has gone up   Went to UC a week ago - dx with sinus infx and given augmentin  Also ETD in L ear She has a tube in the R   Patient Active Problem List  Diagnosis  . HEMANGIOMA, HEPATIC  . ANXIETY  . DEPRESSION  . ADHD  . KNEE PAIN, RIGHT  . SCOLIOSIS  . PALPITATIONS  . Sinus tachycardia  . Routine general medical examination at a health care facility  . Screen for STD (sexually transmitted disease)  . Weight loss  . Atypical migraine   Past Medical History  Diagnosis Date  . Depression   . Anxiety   . Chondromalacia of right knee     right  . Sinus tachycardia     eval by Dr. Jens Som in 2007: monior revealed sinus tach. vs EAT; Echo 10/07: EF 70%  . Varicose veins     eval. by vascular surgeon and procedure scheduled to correct  . Insomnia    Past Surgical History  Procedure Date  . Myringotomy   . Cesarean section 2009  . Knee surgery 04/10/2010   History  Substance Use Topics  . Smoking status: Never Smoker   . Smokeless tobacco: Never Used  . Alcohol Use: Yes     Comment: occasional to rare   Family History  Problem Relation Age of Onset  . Coronary artery disease      grandfather  . Heart attack      grandfather  . Breast cancer      grandmother  . Bipolar disorder     grandmother   Allergies  Allergen Reactions  . Escitalopram Oxalate     REACTION: did not help  . Venlafaxine     REACTION: hypomania   Current Outpatient Prescriptions on File Prior to Visit  Medication Sig Dispense Refill  . ALPRAZolam (XANAX) 0.25 MG tablet Take 0.25 mg by mouth as needed.        Marland Kitchen amoxicillin-clavulanate (AUGMENTIN) 875-125 MG per tablet Take 1 tablet by mouth 2 (two) times daily.  20 tablet  0  . calcium carbonate (OS-CAL) 600 MG TABS Take 600 mg by mouth. Take 1-2 daily       . Clindamycin-Benzoyl Per, Refr, (DUAC) gel Apply topically at bedtime.        . clonazePAM (KLONOPIN) 1 MG tablet Take 1 mg by mouth. Take 1 & 1/2 tablets at bedtime       . ibuprofen (ADVIL,MOTRIN) 400 MG tablet Take 400 mg by mouth as needed.        Marland Kitchen ipratropium (ATROVENT) 0.06 % nasal spray Place 2 sprays into the nose  4 (four) times daily.  15 mL  1  . lamoTRIgine (LAMICTAL) 150 MG tablet Take 150 mg by mouth daily.        . Norethindrone-Ethinyl Estradiol-Fe Biphas (LO LOESTRIN FE) 1 MG-10 MCG / 10 MCG tablet Take 1 tablet by mouth daily.      . SUMAtriptan (IMITREX) 6 MG/0.5ML SOLN injection Inject 4 mg into the skin every 2 (two) hours as needed. F      . tretinoin (ATRALIN) 0.05 % gel Apply topically daily.        . verapamil (CALAN) 120 MG tablet Take 120 mg by mouth 3 (three) times daily.      Marland Kitchen zolpidem (AMBIEN) 10 MG tablet Take 10 mg by mouth at bedtime as needed.        Marland Kitchen atenolol (TENORMIN) 50 MG tablet Take 1 tablet (50 mg total) by mouth daily.  14 tablet  0  . metoprolol (TOPROL-XL) 50 MG 24 hr tablet Take 1 tablet (50 mg total) by mouth daily.  14 tablet  0  . nadolol (CORGARD) 40 MG tablet Take 1 tablet (40 mg total) by mouth daily.  14 tablet  0  . propranolol (INDERAL LA) 80 MG 24 hr capsule Take 1 capsule (80 mg total) by mouth daily.  14 capsule  0      Review of Systems Review of Systems  Constitutional: Negative for appetite change,  and unexpected weight  change.  ENt pos for cong and sinus pressure and post nasal drip  Eyes: Negative for pain and visual disturbance.  Respiratory: Negative for wheeze  and shortness of breath.   Cardiovascular: Negative for cp or palpitations    Gastrointestinal: Negative for nausea, diarrhea and constipation.  Genitourinary: Negative for urgency and frequency.  Skin: Negative for pallor or rash   Neurological: Negative for weakness, light-headedness, numbness and headaches.  Hematological: Negative for adenopathy. Does not bruise/bleed easily.  Psychiatric/Behavioral: Negative for dysphoric mood. The patient is not nervous/anxious.         Objective:   Physical Exam  Constitutional: She appears well-developed and well-nourished. No distress.  HENT:  Head: Normocephalic and atraumatic.  Right Ear: External ear normal.  Left Ear: External ear normal.  Mouth/Throat: Oropharynx is clear and moist. No oropharyngeal exudate.       Nares are injected and congested   Mild maxillary sinus tenderness  Eyes: Conjunctivae normal and EOM are normal. Pupils are equal, round, and reactive to light. Right eye exhibits no discharge. Left eye exhibits no discharge.  Neck: Normal range of motion. Neck supple.  Cardiovascular: Normal rate, regular rhythm and normal heart sounds.   Pulmonary/Chest: Effort normal and breath sounds normal. No respiratory distress. She has no wheezes.  Abdominal: Soft. Bowel sounds are normal. She exhibits no distension. There is no tenderness.  Lymphadenopathy:    She has no cervical adenopathy.  Neurological: She is alert.  Skin: Skin is warm and dry. No rash noted.  Psychiatric: She has a normal mood and affect.          Assessment & Plan:

## 2012-04-30 NOTE — Patient Instructions (Addendum)
Pregnancy test today  Chest xray today  Finish your augmentin  Use ventolin for wheezing as needed- update if worse  Try the hycodan cough syrup as needed  Update if not starting to improve in a week or if worsening

## 2012-04-30 NOTE — Assessment & Plan Note (Addendum)
Poss due to stress and wt change upreg negative

## 2012-07-19 ENCOUNTER — Other Ambulatory Visit: Payer: Self-pay

## 2012-07-28 ENCOUNTER — Other Ambulatory Visit: Payer: Self-pay | Admitting: *Deleted

## 2012-07-28 NOTE — Telephone Encounter (Signed)
Please call pt- ask what it is for ? , thanks

## 2012-07-28 NOTE — Telephone Encounter (Signed)
Received fax refill request, ok to refill? Not on med list

## 2012-07-29 NOTE — Telephone Encounter (Signed)
Thanks for the heads up - sounds like she needs to talk to her pharmacy about that

## 2012-07-29 NOTE — Telephone Encounter (Signed)
Left voicemail requesting pt to call office 

## 2012-07-29 NOTE — Telephone Encounter (Signed)
Pt said that she doesn't need the refill anymore, pt needed a week ago because she had the noro virus but is better, pt said she requested the Rx a week ago when she was sick but is over the bug now so doesn't need it, I did advise pt we just received the Rx request yesterday

## 2012-08-01 ENCOUNTER — Other Ambulatory Visit: Payer: Self-pay | Admitting: Family Medicine

## 2012-08-05 ENCOUNTER — Encounter: Payer: Self-pay | Admitting: Sports Medicine

## 2012-08-05 ENCOUNTER — Encounter: Payer: Self-pay | Admitting: *Deleted

## 2012-08-05 MED ORDER — AMOXICILLIN-POT CLAVULANATE 600-42.9 MG/5ML PO SUSR: ORAL | Status: DC

## 2012-08-05 NOTE — Progress Notes (Signed)
Patient ID: Kimberly Torres, female   DOB: 09-22-1980, 32 y.o.   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 She has a history of reoccurring strep throat. She has seen Kimberly Torres in the past. She's had multiple throat cultures all of which were positive for strep pneumonia. She is typically treated with Augmentin with good symptom relief. In addition to her sore throat she is feeling fatigue. No fever. She's having some difficulty swallowing due to pain. No rhinorrhea. No cough.  Medical history is reviewed and on the chart  Physical exam No acute distress, temperature 97.6  HEENT: There is a bilateral tonsillar erythema with pustules appreciated on the left tonsil. Mild anterior cervical lymphadenopathy. No posterior adenopathy.  Impression/plan  1. Probable strep throat  Patient states her symptoms are identical in nature to what she's had previously with strep throat. Prescription for Augmentin suspension with instructions to take 10 cc twice daily for 10 days. I've asked her to hold on to the prescription for 24 hours and if symptoms worsen go ahead and start the antibiotic. I also think she should followup with Kimberly Torres. She may at some point need a tonsillectomy.

## 2012-08-05 NOTE — Telephone Encounter (Signed)
Ok to refill? No on med list

## 2012-08-05 NOTE — Telephone Encounter (Signed)
Pt said doesn't need Rx, was sent in error will decline

## 2012-08-05 NOTE — Telephone Encounter (Signed)
Please ask her what she needs it for - what symptoms is she having?

## 2012-08-06 ENCOUNTER — Ambulatory Visit: Payer: 59 | Admitting: Family Medicine

## 2012-09-23 ENCOUNTER — Ambulatory Visit (INDEPENDENT_AMBULATORY_CARE_PROVIDER_SITE_OTHER): Payer: 59 | Admitting: Family Medicine

## 2012-09-23 ENCOUNTER — Encounter: Payer: Self-pay | Admitting: Family Medicine

## 2012-09-23 VITALS — BP 118/72 | HR 104 | Temp 98.5°F | Ht 68.0 in | Wt 141.2 lb

## 2012-09-23 DIAGNOSIS — J209 Acute bronchitis, unspecified: Secondary | ICD-10-CM | POA: Insufficient documentation

## 2012-09-23 DIAGNOSIS — J069 Acute upper respiratory infection, unspecified: Secondary | ICD-10-CM

## 2012-09-23 NOTE — Patient Instructions (Addendum)
I think you have a vial upper respiratory infection Drink lots of fluids and rest  mucinex DM is good for cough - if you need something stronger let me know  Ibuprofen for fever or aches -take with food  Antihistamine for runny nose  Update if not starting to improve in a week or if worsening

## 2012-09-23 NOTE — Assessment & Plan Note (Signed)
Controlled symptomatically otc meds Will call if she needs stronger cough med Exam re assuring Update if not starting to improve in a week or if worsening   Disc symptomatic care - see instructions on AVS

## 2012-09-23 NOTE — Progress Notes (Signed)
Subjective:    Patient ID: Kimberly Torres, female    DOB: 1980-11-07, 32 y.o.   MRN: [id number]  HPI Here for uri symptoms  Symptoms started last Wednesday- thought at first allergies - took her flonase and mucinex Friday started running a fever  Husband noticed she was flushed -temp 101.2 Taking ibuprofen for that -it helps  Thinks she had rash on wed - red bumps on arms and legs -then that went away  Yesterday had a headache (head and sinuses)  Nasal drainage is clear  Not a lot of sore throat - just post nasal drip  Ears are "stuffy" Some cough - dry cough / worse at night -not severe  No cough med except mucinex (plain) No flu contacts known   Visited a friend with viral meningitis at beginning of the month - at that time she was improved and allowed to have visitors and she wore a mask  She did get a flu shot this year   Patient Active Problem List  Diagnosis  . HEMANGIOMA, HEPATIC  . ANXIETY  . DEPRESSION  . ADHD  . KNEE PAIN, RIGHT  . SCOLIOSIS  . PALPITATIONS  . Sinus tachycardia  . Routine general medical examination at a health care facility  . Screen for STD (sexually transmitted disease)  . Weight loss  . Atypical migraine  . Irregular menses   Past Medical History  Diagnosis Date  . Depression   . Anxiety   . Chondromalacia of right knee     right  . Sinus tachycardia     eval by Dr. Jens Som in 2007: monior revealed sinus tach. vs EAT; Echo 10/07: EF 70%  . Varicose veins     eval. by vascular surgeon and procedure scheduled to correct  . Insomnia    Past Surgical History  Procedure Laterality Date  . Myringotomy    . Cesarean section  2009  . Knee surgery  04/10/2010   History  Substance Use Topics  . Smoking status: Never Smoker   . Smokeless tobacco: Never Used  . Alcohol Use: Yes     Comment: occasional to rare   Family History  Problem Relation Age of Onset  . Coronary artery disease      grandfather  . Heart attack     grandfather  . Breast cancer      grandmother  . Bipolar disorder      grandmother   Allergies  Allergen Reactions  . Escitalopram Oxalate     REACTION: did not help  . Venlafaxine     REACTION: hypomania   Current Outpatient Prescriptions on File Prior to Visit  Medication Sig Dispense Refill  . ALPRAZolam (XANAX) 0.25 MG tablet Take 0.25 mg by mouth as needed.        . calcium carbonate (OS-CAL) 600 MG TABS Take 600 mg by mouth. Take 1-2 daily       . Clindamycin-Benzoyl Per, Refr, (DUAC) gel Apply topically at bedtime.        . clonazePAM (KLONOPIN) 1 MG tablet Take 1 mg by mouth. Take 1 -2 tablets at bedtime      . ibuprofen (ADVIL,MOTRIN) 400 MG tablet Take 400 mg by mouth as needed.        . lamoTRIgine (LAMICTAL) 150 MG tablet Take 150 mg by mouth daily.        . Norethindrone-Ethinyl Estradiol-Fe Biphas (LO LOESTRIN FE) 1 MG-10 MCG / 10 MCG tablet Take 1 tablet by mouth daily.      Marland Kitchen  tretinoin (ATRALIN) 0.05 % gel Apply topically daily.        Marland Kitchen zolpidem (AMBIEN) 10 MG tablet Take 5 mg by mouth at bedtime as needed.        No current facility-administered medications on file prior to visit.     Review of Systems Review of Systems  Constitutional: Negative for  appetite change,  and unexpected weight change ENt pos for cong and rhinorrhea and drip/ neg for sinus pain .  Eyes: Negative for pain and visual disturbance.  Respiratory: Negative for wheeze and shortness of breath.   Cardiovascular: Negative for cp or palpitations    Gastrointestinal: Negative for nausea, diarrhea and constipation.  Genitourinary: Negative for urgency and frequency.  Skin: Negative for pallor or rash   Neurological: Negative for weakness, light-headedness, numbness and headaches.  Hematological: Negative for adenopathy. Does not bruise/bleed easily.  Psychiatric/Behavioral: Negative for dysphoric mood. The patient is not nervous/anxious.         Objective:   Physical Exam  Constitutional:  She appears well-developed and well-nourished. No distress.  HENT:  Head: Normocephalic and atraumatic.  Right Ear: External ear normal.  Left Ear: External ear normal.  Mouth/Throat: Oropharynx is clear and moist. No oropharyngeal exudate.  Nares are injected and congested  No sinus tenderness  Eyes: Conjunctivae and EOM are normal. Pupils are equal, round, and reactive to light. Right eye exhibits no discharge. Left eye exhibits no discharge.  Neck: Normal range of motion and full passive range of motion without pain. Neck supple. No rigidity. No Brudzinski's sign and no Kernig's sign noted.  Cardiovascular: Regular rhythm and normal heart sounds.   No murmur heard. Mild tachycardia-rate in high 90s on exam  Pulmonary/Chest: Effort normal and breath sounds normal. No stridor. No respiratory distress. She has no wheezes. She has no rales.  Abdominal: Soft. Bowel sounds are normal. She exhibits no distension. There is no tenderness. There is no rebound.  Lymphadenopathy:    She has no cervical adenopathy.  Neurological: She is alert. She has normal reflexes.  Skin: Skin is warm and dry. No rash noted. No erythema. No pallor.  Psychiatric: She has a normal mood and affect.          Assessment & Plan:

## 2012-09-30 ENCOUNTER — Encounter: Payer: Self-pay | Admitting: Family Medicine

## 2012-09-30 ENCOUNTER — Telehealth: Payer: Self-pay | Admitting: Family Medicine

## 2012-09-30 MED ORDER — HYDROCOD POLST-CHLORPHEN POLST 10-8 MG/5ML PO LQCR: 5.0000 mL | Freq: Two times a day (BID) | ORAL | Status: DC | PRN

## 2012-09-30 NOTE — Telephone Encounter (Signed)
Rx called in as prescribed 

## 2012-09-30 NOTE — Telephone Encounter (Signed)
Px written for call in   Pt requested by my chart- please verify pharmacy with her, thanks

## 2013-01-15 ENCOUNTER — Encounter: Payer: Self-pay | Admitting: Family Medicine

## 2013-01-15 ENCOUNTER — Telehealth: Payer: Self-pay | Admitting: Family Medicine

## 2013-01-15 MED ORDER — POLYETHYLENE GLYCOL 3350 17 GM/SCOOP PO POWD: 17.0000 g | Freq: Every day | ORAL | Status: DC | PRN

## 2013-01-15 NOTE — Telephone Encounter (Signed)
Per mychart request 

## 2013-04-09 ENCOUNTER — Other Ambulatory Visit: Payer: Self-pay

## 2013-05-12 ENCOUNTER — Telehealth: Payer: Self-pay | Admitting: Family Medicine

## 2013-05-12 DIAGNOSIS — Z0000 Encounter for general adult medical examination without abnormal findings: Secondary | ICD-10-CM

## 2013-05-12 NOTE — Telephone Encounter (Signed)
Message copied by Judy Pimple on Tue May 12, 2013  8:07 AM ------      Message from: Alvina Chou      Created: Fri May 01, 2013  2:14 PM      Regarding: Lab orders for [facility], 12.10.14       Patient is scheduled for CPX labs, please order future labs, Thanks , Terri       ------

## 2013-05-13 ENCOUNTER — Other Ambulatory Visit (INDEPENDENT_AMBULATORY_CARE_PROVIDER_SITE_OTHER): Payer: 59

## 2013-05-13 DIAGNOSIS — Z0000 Encounter for general adult medical examination without abnormal findings: Secondary | ICD-10-CM

## 2013-05-13 LAB — LIPID PANEL
Cholesterol: 162 mg/dL (ref 0–200)
HDL: 64.3 mg/dL (ref >39.00)
LDL Cholesterol: 83 mg/dL (ref 0–99)
Total CHOL/HDL Ratio: 3
Triglycerides: 75 mg/dL (ref 0.0–149.0)
VLDL: 15 mg/dL (ref 0.0–40.0)

## 2013-05-13 LAB — CBC WITH DIFFERENTIAL/PLATELET
Basophils Absolute: 0 10*3/uL (ref 0.0–0.1)
Basophils Relative: 0.5 % (ref 0.0–3.0)
Eosinophils Absolute: 0 10*3/uL (ref 0.0–0.7)
Eosinophils Relative: 1.1 % (ref 0.0–5.0)
HCT: 37.2 % (ref 36.0–46.0)
Hemoglobin: 12.8 g/dL (ref 12.0–15.0)
Lymphocytes Relative: 34.4 % (ref 12.0–46.0)
Lymphs Abs: 1.4 10*3/uL (ref 0.7–4.0)
MCHC: 34.5 g/dL (ref 30.0–36.0)
MCV: 90.7 fl (ref 78.0–100.0)
Monocytes Absolute: 0.3 10*3/uL (ref 0.1–1.0)
Monocytes Relative: 7.5 % (ref 3.0–12.0)
Neutro Abs: 2.4 10*3/uL (ref 1.4–7.7)
Neutrophils Relative %: 56.5 % (ref 43.0–77.0)
Platelets: 236 10*3/uL (ref 150.0–400.0)
RBC: 4.11 Mil/uL (ref 3.87–5.11)
RDW: 12 % (ref 11.5–14.6)
WBC: 4.2 10*3/uL — ABNORMAL LOW (ref 4.5–10.5)

## 2013-05-13 LAB — COMPREHENSIVE METABOLIC PANEL
ALT: 13 U/L (ref 0–35)
Albumin: 3.7 g/dL (ref 3.5–5.2)
CO2: 26 mEq/L (ref 19–32)
Chloride: 107 mEq/L (ref 96–112)
GFR: 86 mL/min (ref >60.00)
Potassium: 4.1 mEq/L (ref 3.5–5.1)
Sodium: 140 mEq/L (ref 135–145)
Total Bilirubin: 0.4 mg/dL (ref 0.3–1.2)
Total Protein: 7.1 g/dL (ref 6.0–8.3)

## 2013-05-20 ENCOUNTER — Encounter: Payer: 59 | Admitting: Family Medicine

## 2013-05-22 ENCOUNTER — Ambulatory Visit (INDEPENDENT_AMBULATORY_CARE_PROVIDER_SITE_OTHER): Payer: 59 | Admitting: Family Medicine

## 2013-05-22 ENCOUNTER — Encounter: Payer: Self-pay | Admitting: Family Medicine

## 2013-05-22 ENCOUNTER — Encounter: Payer: 59 | Admitting: Family Medicine

## 2013-05-22 VITALS — BP 118/74 | HR 92 | Temp 98.0°F | Ht 67.0 in | Wt 155.8 lb

## 2013-05-22 DIAGNOSIS — S161XXA Strain of muscle, fascia and tendon at neck level, initial encounter: Secondary | ICD-10-CM

## 2013-05-22 DIAGNOSIS — G44009 Cluster headache syndrome, unspecified, not intractable: Secondary | ICD-10-CM

## 2013-05-22 DIAGNOSIS — S139XXA Sprain of joints and ligaments of unspecified parts of neck, initial encounter: Secondary | ICD-10-CM

## 2013-05-22 DIAGNOSIS — Z0000 Encounter for general adult medical examination without abnormal findings: Secondary | ICD-10-CM

## 2013-05-22 NOTE — Progress Notes (Signed)
Subjective:    Patient ID: Kimberly Torres, female    DOB: [birth year], 32 y.o.   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  Is on folic acid 1 mg  On continuous OC -no menses   She just started isotrentinoin 20 mg and has to use condoms - has to have preg test every month  Had failed all other treatments for acne in the past   Td 5/06  Mood - some stress/ work is crazy overall  On current medicines / psychiatry - is still in a bit of a "funk" She sees a Veterinary surgeon - that is very helpful  Sleeping well  Eating well also   Lab Results  Component Value Date   WBC 4.2* 05/13/2013   HGB 12.8 05/13/2013   HCT 37.2 05/13/2013   MCV 90.7 05/13/2013   PLT 236.0 05/13/2013    Lab Results  Component Value Date   TSH 1.60 05/13/2013      Chemistry      Component Value Date/Time   NA 140 05/13/2013 0937   K 4.1 05/13/2013 0937   CL 107 05/13/2013 0937   CO2 26 05/13/2013 0937   BUN 10 05/13/2013 0937   CREATININE 0.8 05/13/2013 0937      Component Value Date/Time   CALCIUM 8.9 05/13/2013 0937   ALKPHOS 40 05/13/2013 0937   AST 14 05/13/2013 0937   ALT 13 05/13/2013 0937   BILITOT 0.4 05/13/2013 0937      Glucose 95   Lab Results  Component Value Date   CHOL 162 05/13/2013   CHOL 143 01/29/2012   CHOL 141 04/03/2010   Lab Results  Component Value Date   HDL 64.30 05/13/2013   HDL 60.80 01/29/2012   HDL 40.98 04/03/2010   Lab Results  Component Value Date   LDLCALC 83 05/13/2013   LDLCALC 68 01/29/2012   LDLCALC 76 04/03/2010   Lab Results  Component Value Date   TRIG 75.0 05/13/2013   TRIG 73.0 01/29/2012   TRIG 68.0 04/03/2010   Lab Results  Component Value Date   CHOLHDL 3 05/13/2013   CHOLHDL 2 01/29/2012   CHOLHDL 3 04/03/2010   No results found for this basename: LDLDIRECT   good profile - eats well and exercise  Review of Systems Review of Systems  Constitutional: Negative for fever, appetite change, fatigue and unexpected weight change.  Eyes: Negative for pain and visual disturbance.  Respiratory: Negative for cough and shortness of breath.   Cardiovascular: Negative for cp or palpitations    Gastrointestinal: Negative for nausea, diarrhea and constipation.  Genitourinary: Negative for urgency and frequency.  Skin: Negative for pallor or rash   MSK pos for neck soreness/ tightness Neurological: Negative for weakness, light-headedness, numbness and headaches.  Hematological: Negative for adenopathy. Does not bruise/bleed easily.  Psychiatric/Behavioral: pos  for dysphoric  mood/ mild . The patient is not nervous/anxious.         Objective:   Physical Exam  Constitutional: She appears well-developed and well-nourished. No distress.  HENT:  Head: Normocephalic and atraumatic.  Right Ear: External ear normal.  Left Ear: External ear normal.  Mouth/Throat: Oropharynx is clear and moist.  Eyes: Conjunctivae and EOM are normal. Pupils are equal, round, and reactive to light. No scleral icterus.  Neck: Normal range of motion. Neck supple. No JVD present. Carotid bruit is not present. No thyromegaly present.  Cardiovascular: Normal rate, regular rhythm, normal heart sounds and intact distal pulses.  Exam reveals no gallop.   Pulmonary/Chest: Effort normal and breath sounds normal. No respiratory distress. She has no wheezes. She exhibits no tenderness.  Abdominal: Soft. Bowel sounds are normal. She exhibits no distension, no  abdominal bruit and no mass. There is no tenderness.  Musculoskeletal: Normal range of motion. She exhibits tenderness. She exhibits no edema.  Tight / tender para cervical musculature   Lymphadenopathy:    She has no cervical adenopathy.  Neurological: She is alert. She has normal reflexes. No cranial nerve deficit. She exhibits normal muscle tone. Coordination normal.  Skin: Skin is warm and dry. No rash noted. No erythema. No pallor.  Solar lentigos diffusely   Psychiatric: She has a normal mood and affect.          Assessment & Plan:

## 2013-05-22 NOTE — Patient Instructions (Signed)
I'm glad you are doing well  Take care of yourself  Eat healthy and get enough exercise

## 2013-05-22 NOTE — Progress Notes (Signed)
Pre-visit discussion using our clinic review tool. No additional management support is needed unless otherwise documented below in the visit note.  

## 2013-05-22 NOTE — Assessment & Plan Note (Signed)
Reviewed health habits including diet and exercise and skin cancer prevention Reviewed appropriate screening tests for age  Also reviewed health mt list, fam hx and immunization status , as well as social and family history   See HPI Labs reviewed incl good cholesterol profile

## 2013-05-22 NOTE — Assessment & Plan Note (Signed)
Ref for massage  Disc changing ergonomics at work as much as possible  Aware that this can trigger migrane

## 2013-05-24 ENCOUNTER — Encounter: Payer: Self-pay | Admitting: Family Medicine

## 2013-07-21 ENCOUNTER — Encounter: Payer: Self-pay | Admitting: Family Medicine

## 2013-07-22 ENCOUNTER — Telehealth: Payer: Self-pay | Admitting: Family Medicine

## 2013-07-22 MED ORDER — CYCLOBENZAPRINE HCL 10 MG PO TABS: 10.0000 mg | ORAL_TABLET | Freq: Three times a day (TID) | ORAL | Status: DC | PRN

## 2013-07-22 NOTE — Telephone Encounter (Signed)
See mychart message about back pain Sent flexeril to pharmacy

## 2013-08-14 ENCOUNTER — Encounter: Payer: Self-pay | Admitting: Family Medicine

## 2013-08-14 ENCOUNTER — Ambulatory Visit (INDEPENDENT_AMBULATORY_CARE_PROVIDER_SITE_OTHER)
Admission: RE | Admit: 2013-08-14 | Discharge: 2013-08-14 | Disposition: A | Discharge status: Home / Self Care | Payer: 59 | Source: Ambulatory Visit | Attending: Family Medicine | Admitting: Family Medicine

## 2013-08-14 ENCOUNTER — Ambulatory Visit (INDEPENDENT_AMBULATORY_CARE_PROVIDER_SITE_OTHER): Payer: 59 | Admitting: Family Medicine

## 2013-08-14 VITALS — BP 114/72 | HR 109 | Temp 98.1°F | Ht 67.0 in | Wt 158.5 lb

## 2013-08-14 DIAGNOSIS — M545 Low back pain, unspecified: Secondary | ICD-10-CM

## 2013-08-14 MED ORDER — MELOXICAM 15 MG PO TABS: 15.0000 mg | ORAL_TABLET | Freq: Every day | ORAL | Status: DC

## 2013-08-14 MED ORDER — CYCLOBENZAPRINE HCL 10 MG PO TABS: 10.0000 mg | ORAL_TABLET | Freq: Every day | ORAL | Status: DC

## 2013-08-14 NOTE — Patient Instructions (Signed)
Continue intermittent ice and heat Keep doing stretches Walk  Xray today Plan from there  mobic and flexeril sent to pharmacy

## 2013-08-14 NOTE — Progress Notes (Signed)
Pre visit review using our clinic review tool, if applicable. No additional management support is needed unless otherwise documented below in the visit note. 

## 2013-08-14 NOTE — Progress Notes (Signed)
Subjective:    Patient ID: Kimberly Torres, female    DOB: 21-Aug-1980, 33 y.o.   MRN: [id number]  HPI Here for back pain  Hurt her back about 4 weeks ago  Bent up to pick a toy up off the floor Felt it catch - immediate pain  Pain is across low back- entire lumbar spine  Usually both sides - some days L is a little worse  No radiation to legs/ no neuro symptoms   Hx of bulging disc in the past - when child was born -- ? L4L5   Has seen chiropractor- dr Overton Mam- scolisis (reviewed xrays on disk today- both standing) Lat view L1-L2 disc space looks smaller  She has had 11 visits - and he is trying to realign her with spinal manipulation   She works at [employer] - and she is right near [facility]   She takes flexeril in eve-that helps and she needs it in the evenings  nsaid- was on motrin then changed to mobic (15 mg)   Patient Active Problem List   Diagnosis Date Noted  . Low back pain 08/14/2013  . Neck muscle strain 05/22/2013  . Cluster headache 02/15/2012  . Routine general medical examination at a health care facility 01/28/2012  . Screen for STD (sexually transmitted disease) 01/28/2012  . Sinus tachycardia 09/11/2010  . PALPITATIONS 08/11/2010  . ANXIETY 04/15/2009  . HEMANGIOMA, HEPATIC 03/14/2009  . DEPRESSION 06/25/2007  . ADHD 06/25/2007  . SCOLIOSIS 06/25/2007   Past Medical History  Diagnosis Date  . Depression   . Anxiety   . Chondromalacia of right knee     right  . Sinus tachycardia     eval by Dr. Stanford Breed in 2007: monior revealed sinus tach. vs EAT; Echo 10/07: EF 70%  . Varicose veins     eval. by vascular surgeon and procedure scheduled to correct  . Insomnia   . Acne     sees derm- on accutane   Past Surgical History  Procedure Laterality Date  . Myringotomy    . Cesarean section  2009  . Knee surgery  04/10/2010   History  Substance Use Topics  . Smoking status: Never Smoker   . Smokeless tobacco: Never Used  . Alcohol  Use: Yes     Comment: occasional to rare   Family History  Problem Relation Age of Onset  . Coronary artery disease      grandfather  . Heart attack      grandfather  . Breast cancer      grandmother  . Bipolar disorder      grandmother   Allergies  Allergen Reactions  . Escitalopram Oxalate     REACTION: did not help  . Venlafaxine     REACTION: hypomania   Current Outpatient Prescriptions on File Prior to Visit  Medication Sig Dispense Refill  . calcium carbonate (OS-CAL) 600 MG TABS Take 600 mg by mouth. Take 1-2 daily       . clonazePAM (KLONOPIN) 1 MG tablet Take 1 mg by mouth. Take 1 -2 tablets at bedtime      . fluticasone (FLONASE) 50 MCG/ACT nasal spray Place 2 sprays into the nose daily as needed.       . folic acid (FOLVITE) 1 MG tablet Take 1 mg by mouth daily.      Marland Kitchen lamoTRIgine (LAMICTAL) 150 MG tablet Take 150 mg by mouth daily.        . Norethindrone-Ethinyl  Estradiol-Fe Biphas (LO LOESTRIN FE) 1 MG-10 MCG / 10 MCG tablet Take 1 tablet by mouth daily.      . polyethylene glycol powder (GLYCOLAX/MIRALAX) powder Take 17 g by mouth daily as needed.  3350 g  5  . zolpidem (AMBIEN) 10 MG tablet Take 5 mg by mouth at bedtime as needed.        No current facility-administered medications on file prior to visit.     Review of Systems    Review of Systems  Constitutional: Negative for fever, appetite change, fatigue and unexpected weight change.  Eyes: Negative for pain and visual disturbance.  Respiratory: Negative for cough and shortness of breath.   Cardiovascular: Negative for cp or palpitations    Gastrointestinal: Negative for nausea, diarrhea and constipation.  Genitourinary: Negative for urgency and frequency.  Skin: Negative for pallor or rash   MSK pos for back pain worse with movement Neurological: Negative for weakness, light-headedness, numbness and headaches.  Hematological: Negative for adenopathy. Does not bruise/bleed easily.    Psychiatric/Behavioral: Negative for dysphoric mood. The patient is not nervous/anxious.       Objective:   Physical Exam  Constitutional: She appears well-developed and well-nourished. No distress.  HENT:  Head: Normocephalic.  Eyes: Conjunctivae and EOM are normal. Pupils are equal, round, and reactive to light. No scleral icterus.  Neck: Normal range of motion. Neck supple.  Cardiovascular: Normal rate and regular rhythm.   Pulmonary/Chest: Effort normal and breath sounds normal.  Abdominal:  No suprapubic tenderness or fullness   No cva tenderness   Musculoskeletal: She exhibits tenderness. She exhibits no edema.       Lumbar back: She exhibits decreased range of motion, tenderness and spasm. She exhibits no bony tenderness, no swelling, no edema and no deformity.  Tender over L para lumbar musculature and to a less degree R  Nl rom hips Neg SLR  Nl gait  Flex 30 deg and ext 10 deg with pain  Lat bend painful on L   No neuro changes   Lymphadenopathy:    She has no cervical adenopathy.  Neurological: She is alert. She has normal reflexes. She displays no atrophy. No cranial nerve deficit or sensory deficit. She exhibits normal muscle tone. Coordination normal.  Skin: Skin is warm and dry. No rash noted. No erythema.  Psychiatric: She has a normal mood and affect.          Assessment & Plan:

## 2013-08-15 NOTE — Assessment & Plan Note (Signed)
Acute and not chronic but intermittent in past  Rev chiropractor films Mild thoracolumbar scoliosis  Xray today herre  Disc use of heat/stretching/walking  Flexeril qhs with caution  meloxicam 15 mg daily  Pend xr report - may consider PT Suspect muscular

## 2013-08-17 ENCOUNTER — Telehealth: Payer: Self-pay | Admitting: Family Medicine

## 2013-08-17 DIAGNOSIS — M545 Low back pain, unspecified: Secondary | ICD-10-CM

## 2013-08-17 NOTE — Telephone Encounter (Signed)
Ref to [facility] for back pain

## 2013-09-07 ENCOUNTER — Encounter: Payer: Self-pay | Admitting: Family Medicine

## 2013-09-07 ENCOUNTER — Ambulatory Visit (INDEPENDENT_AMBULATORY_CARE_PROVIDER_SITE_OTHER): Payer: 59 | Admitting: Family Medicine

## 2013-09-07 VITALS — BP 129/89 | Ht 67.0 in | Wt 152.0 lb

## 2013-09-07 DIAGNOSIS — M545 Low back pain, unspecified: Secondary | ICD-10-CM

## 2013-09-07 NOTE — Assessment & Plan Note (Signed)
Lower thoracic upper lumbar pain. She's had a good 7 weeks of very appropriate conservative therapy with little improvement. She does have a decreased DTR at the right Achilles and some mild plantarflexion weakness. I think we'll go ahead and get an MRI and followup after that.

## 2013-09-07 NOTE — Progress Notes (Signed)
   Subjective:    Patient ID: Kimberly Torres, female    DOB: 07-09-1980, 33 y.o.   MRN: [id number]  HPI 7 weeks of low back pain. She recalls the incident where she leaned over to pick up something off the floor and fell they pop in her back. This is similar to an incident she had several years ago which was ultimately diagnosed as herniated disc. For that disc she did physical therapy and it resolved. She tried restarting his physical therapy exercises and has been fairly diligent with them but she's not having any relief. She's also been to the chiropractor and gets a very brief amount of relief from adjustment. No radiation of pain down her legs. No lower strandy weakness. No incontinence of bowel or bladder. Pain is 4-6/10 most of the time, at the end of the day it is increased in she is unable to do some of the activities she wants to do.   Review of Systems Denies fever, sweats, chills, unusual weight change.    Objective:   Physical Exam  Vital signs are reviewed GENERAL: Well-developed female no acute distress BACK: No deformity. The vertebra in the thoracic and lumbar area are nontender to percussion. She has a lot of muscle spasm in that T. 10 to L2 area. She's very tight in this area can flex at the hips 90. Straight leg raise negative bilaterally. Lower extremity strength is 5 out of 5 hip flexor extensor, knee flexion extension. Plantarflexion on the right is 4/5 in dorsiflexion 5 out of 5 compared with 5 out of 5 for both on the left. DTRs are 2+ to 3+ bilaterally at the knee. The right ankle tendon reflex is 1+. Sensation to soft touch is intact.      Assessment & Plan:

## 2013-09-07 NOTE — Patient Instructions (Addendum)
You have been scheduled for a MRI of your lumbar spine on 09/10/13, please arrive at 6:45 pm at radiology on the 1st floor at [facility]  Dr. Nori Riis will be in touch with you about your results  Thank you for seeing [place] today!

## 2013-09-10 ENCOUNTER — Ambulatory Visit (HOSPITAL_COMMUNITY)
Admission: RE | Admit: 2013-09-10 | Discharge: 2013-09-10 | Disposition: A | Discharge status: Home / Self Care | Payer: 59 | Source: Ambulatory Visit | Attending: Family Medicine | Admitting: Family Medicine

## 2013-09-10 DIAGNOSIS — M5126 Other intervertebral disc displacement, lumbar region: Secondary | ICD-10-CM | POA: Insufficient documentation

## 2013-09-10 DIAGNOSIS — M899 Disorder of bone, unspecified: Secondary | ICD-10-CM | POA: Insufficient documentation

## 2013-09-10 DIAGNOSIS — M545 Low back pain, unspecified: Secondary | ICD-10-CM | POA: Insufficient documentation

## 2013-09-10 DIAGNOSIS — M79609 Pain in unspecified limb: Secondary | ICD-10-CM | POA: Insufficient documentation

## 2013-09-10 DIAGNOSIS — M949 Disorder of cartilage, unspecified: Secondary | ICD-10-CM

## 2013-09-10 DIAGNOSIS — X500XXA Overexertion from strenuous movement or load, initial encounter: Secondary | ICD-10-CM | POA: Insufficient documentation

## 2013-09-11 ENCOUNTER — Encounter: Payer: Self-pay | Admitting: Family Medicine

## 2013-09-14 ENCOUNTER — Telehealth: Payer: Self-pay | Admitting: Sports Medicine

## 2013-09-14 NOTE — Telephone Encounter (Signed)
I discussed the lumbar MRI ordered by Dr. Nori Riis with the patient today. She has a small central disc protrusion at L3-L4, facet arthropathy at L4-L5, and a leftward disc protrusion at L5-S1. Patient denies any leg pain. No numbness or tingling in her legs. Primarily back pain which is worse with flexion. Extension is painless. Given these findings my recommendation is for her to work with a physical therapist (her stepbrother is Nanda Quinton at [facility]). Dr. Nori Riis had also discussed the possibility of a steroid Dosepak and I think that that is reasonable. I certainly do not think the patient needs neurosurgical consultation at this time. If her symptoms persist I would consider referral to Dr. Ron Agee for evaluation and treatment.

## 2013-09-15 ENCOUNTER — Encounter: Payer: Self-pay | Admitting: Family Medicine

## 2013-09-15 NOTE — Progress Notes (Signed)
Patient ID: Kimberly Torres, female   DOB: Jun 30, 1980, 33 y.o.   MRN: [id number] Amy/ Governor Specking I spoke w [name] and she DOES want to go ahead and see a NSU.( She has already done 7 weeks of PT). Can you fax her note and MRi from last OV over and see if youcan get her in w NSU---she would like Dr Ronnald Ramp or Dr Mervin Kung has first avail [facility]! Dickie La \

## 2013-09-17 ENCOUNTER — Encounter: Payer: Self-pay | Admitting: Family Medicine

## 2013-09-22 NOTE — Telephone Encounter (Signed)
Spoke w her and started steraprred ds lak

## 2013-09-23 ENCOUNTER — Other Ambulatory Visit: Payer: Self-pay | Admitting: Neurosurgery

## 2013-09-24 ENCOUNTER — Encounter (HOSPITAL_COMMUNITY): Payer: Self-pay

## 2013-09-24 ENCOUNTER — Encounter (HOSPITAL_COMMUNITY)
Admission: RE | Admit: 2013-09-24 | Discharge: 2013-09-24 | Disposition: A | Discharge status: Home / Self Care | Payer: 59 | Source: Ambulatory Visit | Attending: Neurosurgery | Admitting: Neurosurgery

## 2013-09-24 ENCOUNTER — Encounter (HOSPITAL_COMMUNITY): Payer: Self-pay | Admitting: Pharmacy Technician

## 2013-09-24 DIAGNOSIS — Z01810 Encounter for preprocedural cardiovascular examination: Secondary | ICD-10-CM | POA: Insufficient documentation

## 2013-09-24 DIAGNOSIS — Z01812 Encounter for preprocedural laboratory examination: Secondary | ICD-10-CM | POA: Insufficient documentation

## 2013-09-24 HISTORY — DX: Cardiac arrhythmia, unspecified: I49.9

## 2013-09-24 HISTORY — DX: Headache: R51

## 2013-09-24 HISTORY — DX: Allergy status to unspecified drugs, medicaments and biological substances: Z88.9

## 2013-09-24 LAB — BASIC METABOLIC PANEL
BUN: 5 mg/dL — AB (ref 6–23)
CALCIUM: 9.5 mg/dL (ref 8.4–10.5)
CO2: 26 meq/L (ref 19–32)
Chloride: 102 mEq/L (ref 96–112)
Creatinine, Ser: 0.78 mg/dL (ref 0.50–1.10)
GFR calc Af Amer: >90 mL/min (ref >90)
Glucose, Bld: 93 mg/dL (ref 70–99)
Potassium: 4.4 mEq/L (ref 3.7–5.3)
SODIUM: 139 meq/L (ref 137–147)

## 2013-09-24 LAB — CBC
HCT: 38.5 % (ref 36.0–46.0)
Hemoglobin: 13.1 g/dL (ref 12.0–15.0)
MCH: 31.3 pg (ref 26.0–34.0)
MCHC: 34 g/dL (ref 30.0–36.0)
MCV: 92.1 fL (ref 78.0–100.0)
PLATELETS: 234 10*3/uL (ref 150–400)
RBC: 4.18 MIL/uL (ref 3.87–5.11)
RDW: 12.8 % (ref 11.5–15.5)
WBC: 16.7 10*3/uL — ABNORMAL HIGH (ref 4.0–10.5)

## 2013-09-24 LAB — HCG, SERUM, QUALITATIVE: PREG SERUM: NEGATIVE

## 2013-09-24 LAB — SURGICAL PCR SCREEN
MRSA, PCR: NEGATIVE
Staphylococcus aureus: NEGATIVE

## 2013-09-24 NOTE — Pre-Procedure Instructions (Signed)
Kimberly Torres  09/24/2013   Your procedure is scheduled on: Monday, September 28, 2013 at 11:27 AM  Report to [facility] Short Stay (use Main Entrance "A'') at 9:15 AM.  Call this number if you have problems the morning of surgery: [phone]   Remember:   Do not eat food or drink liquids after midnight Sunday, September 27, 2013   Take these medicines the morning of surgery with A SIP OF WATER: lamoTRIgine (LAMICTAL, Norethindrone-Ethinyl Estradiol-Fe Biphas (LO LOESTRIN FE), if needed: chlorpheniramine (CHLOR-TRIMETON) for allergies, fluticasone (FLONASE) for allergies, HYDROcodone-acetaminophen (Tieton) for pain, Stop taking Aspirin and herbal medications. Do not take any NSAIDs ie: Ibuprofen, Advil, Naproxen or any medication containing Aspirin.   Do not wear jewelry, make-up or nail polish.  Do not wear lotions, powders, or perfumes. You may wear deodorant.  Do not shave 48 hours prior to surgery.  Do not bring valuables to the hospital.  [facility] is not responsible for any belongings or valuables.               Contacts, dentures or bridgework may not be worn into surgery.  Leave suitcase in the car. After surgery it may be brought to your room.  For patients admitted to the hospital, discharge time is determined by your treatment team.               Patients discharged the day of surgery will not be allowed to drive home.  Name and phone number of your driver:   Special Instructions:  Special Instructions:Special Instructions: [facility] - Preparing for Surgery  Before surgery, you can play an important role.  Because skin is not sterile, your skin needs to be as free of germs as possible.  You can reduce the number of germs on you skin by washing with CHG (chlorahexidine gluconate) soap before surgery.  CHG is an antiseptic cleaner which kills germs and bonds with the skin to continue killing germs even after washing.  Please DO NOT use if you have an allergy to CHG or  antibacterial soaps.  If your skin becomes reddened/irritated stop using the CHG and inform your nurse when you arrive at Short Stay.  Do not shave (including legs and underarms) for at least 48 hours prior to the first CHG shower.  You may shave your face.  Please follow these instructions carefully:   1.  Shower with CHG Soap the night before surgery and the morning of Surgery.  2.  If you choose to wash your hair, wash your hair first as usual with your normal shampoo.  3.  After you shampoo, rinse your hair and body thoroughly to remove the Shampoo.  4.  Use CHG as you would any other liquid soap.  You can apply chg directly  to the skin and wash gently with scrungie or a clean washcloth.  5.  Apply the CHG Soap to your body ONLY FROM THE NECK DOWN.  Do not use on open wounds or open sores.  Avoid contact with your eyes, ears, mouth and genitals (private parts).  Wash genitals (private parts) with your normal soap.  6.  Wash thoroughly, paying special attention to the area where your surgery will be performed.  7.  Thoroughly rinse your body with warm water from the neck down.  8.  DO NOT shower/wash with your normal soap after using and rinsing off the CHG Soap.  9.  Pat yourself dry with a clean towel.  10.  Wear clean pajamas.            11.  Place clean sheets on your bed the night of your first shower and do not sleep with pets.  Day of Surgery  Do not apply any lotions the morning of surgery.  Please wear clean clothes to the hospital/surgery center.   Please read over the following fact sheets that you were given: Pain Booklet, Coughing and Deep Breathing, MRSA Information and Surgical Site Infection Prevention

## 2013-09-25 NOTE — Progress Notes (Signed)
Anesthesia chart review: Patient is a 33 year old female scheduled for right L3-4 microdiscectomy on 09/28/13 by Dr. Annette Stable.  History includes nonsmoker, anxiety, depression, varicose veins, sinus tachycardia and question of SVT, cluster migraines, right knee chondromalacia, acne on isotretinoin. HR was documented as 125 bpm during her PAT visit.  She is on a prednisone taper thru 09/28/13. PCP is Dr. Loura Pardon. She was seen by [facility] including EP cardiologist Dr. Caryl Comes in 2012 for tachycardia and had an unremarkable echo and ST on Holter monitor.  EKG on 09/24/13 showed ST at 119 bpm.  Echo on 09/25/10 showed: - Left ventricle: The cavity size was normal. Wall thickness was normal. Systolic function was normal. The estimated ejection fraction was in the range of 60% to 65%. Wall motion was normal; there were no regional wall motion abnormalities. Left ventricular diastolic function parameters were normal. - Atrial septum: No defect or patent foramen ovale was identified.  Event monitor read in 10/2010 showed "occasional sinus tachycardia."  Preoperative labs noted.  WBC 16.7 K.  Prednisone could be a contributing factor.  Further evaluation by Dr. Annette Stable and her assigned anesthesiologist on the day of surgery. If no worrisome signs of infection and otherwise no acute changes then I would anticipate she can proceed as planned.  George [facility]/Anesthesiology Phone [phone] 09/25/2013 9:38 AM

## 2013-09-27 MED ORDER — CEFAZOLIN SODIUM-DEXTROSE 2-3 GM-% IV SOLR
2.0000 g | INTRAVENOUS | Status: AC
  Administered 2013-09-28: 2 g via INTRAVENOUS
  Filled 2013-09-27: qty 50

## 2013-09-28 ENCOUNTER — Encounter (HOSPITAL_COMMUNITY): Payer: Self-pay | Admitting: Certified Registered"

## 2013-09-28 ENCOUNTER — Encounter (HOSPITAL_COMMUNITY): Payer: 59 | Admitting: Vascular Surgery

## 2013-09-28 ENCOUNTER — Ambulatory Visit (HOSPITAL_COMMUNITY): Payer: 59

## 2013-09-28 ENCOUNTER — Encounter (HOSPITAL_COMMUNITY)
Admission: RE | Disposition: A | Discharge status: Home / Self Care | Payer: Self-pay | Source: Ambulatory Visit | Attending: Neurosurgery

## 2013-09-28 ENCOUNTER — Ambulatory Visit (HOSPITAL_COMMUNITY)
Admission: RE | Admit: 2013-09-28 | Discharge: 2013-09-29 | Disposition: A | Discharge status: Home / Self Care | Payer: 59 | Source: Ambulatory Visit | Attending: Neurosurgery | Admitting: Neurosurgery

## 2013-09-28 ENCOUNTER — Ambulatory Visit (HOSPITAL_COMMUNITY): Payer: 59 | Admitting: Certified Registered"

## 2013-09-28 DIAGNOSIS — I498 Other specified cardiac arrhythmias: Secondary | ICD-10-CM | POA: Insufficient documentation

## 2013-09-28 DIAGNOSIS — F329 Major depressive disorder, single episode, unspecified: Secondary | ICD-10-CM | POA: Insufficient documentation

## 2013-09-28 DIAGNOSIS — G4700 Insomnia, unspecified: Secondary | ICD-10-CM | POA: Insufficient documentation

## 2013-09-28 DIAGNOSIS — F3289 Other specified depressive episodes: Secondary | ICD-10-CM | POA: Insufficient documentation

## 2013-09-28 DIAGNOSIS — Z79899 Other long term (current) drug therapy: Secondary | ICD-10-CM | POA: Insufficient documentation

## 2013-09-28 DIAGNOSIS — M5116 Intervertebral disc disorders with radiculopathy, lumbar region: Secondary | ICD-10-CM | POA: Diagnosis present

## 2013-09-28 DIAGNOSIS — F411 Generalized anxiety disorder: Secondary | ICD-10-CM | POA: Insufficient documentation

## 2013-09-28 DIAGNOSIS — G43909 Migraine, unspecified, not intractable, without status migrainosus: Secondary | ICD-10-CM | POA: Insufficient documentation

## 2013-09-28 DIAGNOSIS — I8390 Asymptomatic varicose veins of unspecified lower extremity: Secondary | ICD-10-CM | POA: Insufficient documentation

## 2013-09-28 DIAGNOSIS — M9420 Chondromalacia, unspecified site: Secondary | ICD-10-CM | POA: Insufficient documentation

## 2013-09-28 DIAGNOSIS — M5126 Other intervertebral disc displacement, lumbar region: Secondary | ICD-10-CM | POA: Diagnosis present

## 2013-09-28 HISTORY — PX: LUMBAR LAMINECTOMY/DECOMPRESSION MICRODISCECTOMY: SHX5026

## 2013-09-28 SURGERY — LUMBAR LAMINECTOMY/DECOMPRESSION MICRODISCECTOMY 1 LEVEL
Anesthesia: General | Site: Spine Lumbar | Laterality: Right

## 2013-09-28 MED ORDER — OXYCODONE HCL 5 MG PO TABS: 5.0000 mg | ORAL_TABLET | Freq: Once | ORAL | Status: DC | PRN

## 2013-09-28 MED ORDER — ACETAMINOPHEN 650 MG RE SUPP: 650.0000 mg | RECTAL | Status: DC | PRN

## 2013-09-28 MED ORDER — ISOTRETINOIN 40 MG PO CAPS: 40.0000 mg | ORAL_CAPSULE | Freq: Two times a day (BID) | ORAL | Status: DC

## 2013-09-28 MED ORDER — NORETHIN-ETH ESTRAD-FE BIPHAS 1 MG-10 MCG / 10 MCG PO TABS: 1.0000 | ORAL_TABLET | Freq: Every day | ORAL | Status: DC

## 2013-09-28 MED ORDER — BUPIVACAINE HCL (PF) 0.25 % IJ SOLN
INTRAMUSCULAR | Status: DC | PRN
  Administered 2013-09-28: 20 mL

## 2013-09-28 MED ORDER — ROCURONIUM BROMIDE 100 MG/10ML IV SOLN
INTRAVENOUS | Status: DC | PRN
  Administered 2013-09-28: 50 mg via INTRAVENOUS

## 2013-09-28 MED ORDER — GLYCOPYRROLATE 0.2 MG/ML IJ SOLN
INTRAMUSCULAR | Status: AC
  Filled 2013-09-28: qty 2

## 2013-09-28 MED ORDER — CYCLOBENZAPRINE HCL 10 MG PO TABS: 10.0000 mg | ORAL_TABLET | Freq: Every evening | ORAL | Status: DC | PRN

## 2013-09-28 MED ORDER — HYDROCODONE-ACETAMINOPHEN 10-325 MG PO TABS: 1.0000 | ORAL_TABLET | ORAL | Status: DC | PRN

## 2013-09-28 MED ORDER — SUCCINYLCHOLINE CHLORIDE 20 MG/ML IJ SOLN
INTRAMUSCULAR | Status: AC
  Filled 2013-09-28: qty 1

## 2013-09-28 MED ORDER — HYDROMORPHONE HCL PF 1 MG/ML IJ SOLN
0.2500 mg | INTRAMUSCULAR | Status: DC | PRN
  Administered 2013-09-28: 1 mg via INTRAVENOUS
  Administered 2013-09-28 (×2): 0.5 mg via INTRAVENOUS

## 2013-09-28 MED ORDER — METOCLOPRAMIDE HCL 5 MG/ML IJ SOLN
INTRAMUSCULAR | Status: AC
  Filled 2013-09-28: qty 2

## 2013-09-28 MED ORDER — KETOROLAC TROMETHAMINE 30 MG/ML IJ SOLN
INTRAMUSCULAR | Status: DC | PRN
  Administered 2013-09-28: 30 mg via INTRAVENOUS

## 2013-09-28 MED ORDER — SODIUM CHLORIDE 0.9 % IV SOLN: 250.0000 mL | INTRAVENOUS | Status: DC

## 2013-09-28 MED ORDER — METOCLOPRAMIDE HCL 5 MG/ML IJ SOLN: 10.0000 mg | Freq: Once | INTRAMUSCULAR | Status: DC | PRN

## 2013-09-28 MED ORDER — METOCLOPRAMIDE HCL 5 MG/ML IJ SOLN
INTRAMUSCULAR | Status: DC | PRN
  Administered 2013-09-28: 10 mg via INTRAVENOUS

## 2013-09-28 MED ORDER — DEXAMETHASONE SODIUM PHOSPHATE 10 MG/ML IJ SOLN: 10.0000 mg | INTRAMUSCULAR | Status: DC

## 2013-09-28 MED ORDER — PHENYLEPHRINE 40 MCG/ML (10ML) SYRINGE FOR IV PUSH (FOR BLOOD PRESSURE SUPPORT)
PREFILLED_SYRINGE | INTRAVENOUS | Status: AC
  Filled 2013-09-28: qty 10

## 2013-09-28 MED ORDER — SENNA 8.6 MG PO TABS
1.0000 | ORAL_TABLET | Freq: Two times a day (BID) | ORAL | Status: DC
  Administered 2013-09-28: 8.6 mg via ORAL
  Filled 2013-09-28 (×3): qty 1

## 2013-09-28 MED ORDER — FENTANYL CITRATE 0.05 MG/ML IJ SOLN
INTRAMUSCULAR | Status: AC
Start: 2013-09-28 — End: 2013-09-28
  Filled 2013-09-28: qty 5

## 2013-09-28 MED ORDER — SODIUM CHLORIDE 0.9 % IJ SOLN: 3.0000 mL | INTRAMUSCULAR | Status: DC | PRN

## 2013-09-28 MED ORDER — AMPHETAMINE-DEXTROAMPHETAMINE 10 MG PO TABS: 20.0000 mg | ORAL_TABLET | Freq: Every day | ORAL | Status: DC

## 2013-09-28 MED ORDER — LACTATED RINGERS IV SOLN
INTRAVENOUS | Status: DC | PRN
  Administered 2013-09-28 (×2): via INTRAVENOUS

## 2013-09-28 MED ORDER — NEOSTIGMINE METHYLSULFATE 1 MG/ML IJ SOLN
INTRAMUSCULAR | Status: AC
  Filled 2013-09-28: qty 10

## 2013-09-28 MED ORDER — ALUM & MAG HYDROXIDE-SIMETH 200-200-20 MG/5ML PO SUSP
30.0000 mL | Freq: Four times a day (QID) | ORAL | Status: DC | PRN
Start: 2013-09-28 — End: 2013-09-29

## 2013-09-28 MED ORDER — HYDROMORPHONE HCL PF 1 MG/ML IJ SOLN
0.5000 mg | INTRAMUSCULAR | Status: DC | PRN
  Administered 2013-09-28 (×3): 1 mg via INTRAVENOUS
  Filled 2013-09-28 (×3): qty 1

## 2013-09-28 MED ORDER — SODIUM CHLORIDE 0.9 % IJ SOLN
3.0000 mL | Freq: Two times a day (BID) | INTRAMUSCULAR | Status: DC
  Administered 2013-09-28: 3 mL via INTRAVENOUS

## 2013-09-28 MED ORDER — CLONAZEPAM 0.5 MG PO TABS
2.0000 mg | ORAL_TABLET | Freq: Every day | ORAL | Status: DC
  Administered 2013-09-28: 1 mg via ORAL
  Filled 2013-09-28: qty 4

## 2013-09-28 MED ORDER — ACETAMINOPHEN 325 MG PO TABS: 650.0000 mg | ORAL_TABLET | ORAL | Status: DC | PRN

## 2013-09-28 MED ORDER — CEFAZOLIN SODIUM 1-5 GM-% IV SOLN
1.0000 g | Freq: Three times a day (TID) | INTRAVENOUS | Status: AC
  Administered 2013-09-28 – 2013-09-29 (×2): 1 g via INTRAVENOUS
  Filled 2013-09-28 (×2): qty 50

## 2013-09-28 MED ORDER — THROMBIN 5000 UNITS EX SOLR
CUTANEOUS | Status: DC | PRN
  Administered 2013-09-28 (×2): 5000 [IU] via TOPICAL

## 2013-09-28 MED ORDER — DEXAMETHASONE SODIUM PHOSPHATE 4 MG/ML IJ SOLN
INTRAMUSCULAR | Status: AC
  Filled 2013-09-28: qty 2

## 2013-09-28 MED ORDER — FENTANYL CITRATE 0.05 MG/ML IJ SOLN
INTRAMUSCULAR | Status: DC | PRN
  Administered 2013-09-28 (×2): 50 ug via INTRAVENOUS
  Administered 2013-09-28: 100 ug via INTRAVENOUS
  Administered 2013-09-28 (×2): 25 ug via INTRAVENOUS

## 2013-09-28 MED ORDER — LIDOCAINE HCL (CARDIAC) 20 MG/ML IV SOLN
INTRAVENOUS | Status: DC | PRN
  Administered 2013-09-28: 60 mg via INTRAVENOUS

## 2013-09-28 MED ORDER — CYCLOBENZAPRINE HCL 10 MG PO TABS
10.0000 mg | ORAL_TABLET | Freq: Three times a day (TID) | ORAL | Status: DC | PRN
  Administered 2013-09-28 – 2013-09-29 (×2): 10 mg via ORAL
  Filled 2013-09-28 (×2): qty 1

## 2013-09-28 MED ORDER — HYDROMORPHONE HCL PF 1 MG/ML IJ SOLN
INTRAMUSCULAR | Status: AC
  Filled 2013-09-28: qty 1

## 2013-09-28 MED ORDER — FLUTICASONE PROPIONATE 50 MCG/ACT NA SUSP
2.0000 | Freq: Every day | NASAL | Status: DC | PRN
  Filled 2013-09-28: qty 16

## 2013-09-28 MED ORDER — LIDOCAINE HCL (CARDIAC) 20 MG/ML IV SOLN
INTRAVENOUS | Status: AC
  Filled 2013-09-28: qty 5

## 2013-09-28 MED ORDER — HEMOSTATIC AGENTS (NO CHARGE) OPTIME
TOPICAL | Status: DC | PRN
  Administered 2013-09-28: 1 via TOPICAL

## 2013-09-28 MED ORDER — OXYCODONE HCL 5 MG/5ML PO SOLN: 5.0000 mg | Freq: Once | ORAL | Status: DC | PRN

## 2013-09-28 MED ORDER — AMPHETAMINE-DEXTROAMPHET ER 30 MG PO CP24: 30.0000 mg | ORAL_CAPSULE | Freq: Every day | ORAL | Status: DC

## 2013-09-28 MED ORDER — ONDANSETRON HCL 4 MG/2ML IJ SOLN: 4.0000 mg | INTRAMUSCULAR | Status: DC | PRN

## 2013-09-28 MED ORDER — 0.9 % SODIUM CHLORIDE (POUR BTL) OPTIME
TOPICAL | Status: DC | PRN
  Administered 2013-09-28: 1000 mL

## 2013-09-28 MED ORDER — NEOSTIGMINE METHYLSULFATE 1 MG/ML IJ SOLN
INTRAMUSCULAR | Status: DC | PRN
  Administered 2013-09-28: 4 mg via INTRAVENOUS

## 2013-09-28 MED ORDER — LAMOTRIGINE 150 MG PO TABS
150.0000 mg | ORAL_TABLET | Freq: Every day | ORAL | Status: DC
  Filled 2013-09-28: qty 1

## 2013-09-28 MED ORDER — MIDAZOLAM HCL 2 MG/2ML IJ SOLN
INTRAMUSCULAR | Status: AC
  Filled 2013-09-28: qty 2

## 2013-09-28 MED ORDER — ZOLPIDEM TARTRATE 5 MG PO TABS: 5.0000 mg | ORAL_TABLET | Freq: Every evening | ORAL | Status: DC | PRN

## 2013-09-28 MED ORDER — CALCIUM CARBONATE 1250 (500 CA) MG PO TABS
1.0000 | ORAL_TABLET | Freq: Every day | ORAL | Status: DC
  Administered 2013-09-29: 500 mg via ORAL
  Filled 2013-09-28 (×2): qty 1

## 2013-09-28 MED ORDER — ONDANSETRON HCL 4 MG/2ML IJ SOLN
INTRAMUSCULAR | Status: DC | PRN
  Administered 2013-09-28: 4 mg via INTRAVENOUS

## 2013-09-28 MED ORDER — MENTHOL 3 MG MT LOZG: 1.0000 | LOZENGE | OROMUCOSAL | Status: DC | PRN

## 2013-09-28 MED ORDER — OXYCODONE-ACETAMINOPHEN 5-325 MG PO TABS
ORAL_TABLET | ORAL | Status: AC
  Filled 2013-09-28: qty 2

## 2013-09-28 MED ORDER — SODIUM CHLORIDE 0.9 % IR SOLN
Status: DC | PRN
  Administered 2013-09-28: 11:00:00

## 2013-09-28 MED ORDER — LACTATED RINGERS IV SOLN
INTRAVENOUS | Status: DC
  Administered 2013-09-28: 10:00:00 via INTRAVENOUS

## 2013-09-28 MED ORDER — DIPHENHYDRAMINE HCL 25 MG PO TABS: 25.0000 mg | ORAL_TABLET | Freq: Four times a day (QID) | ORAL | Status: DC

## 2013-09-28 MED ORDER — PHENOL 1.4 % MT LIQD: 1.0000 | OROMUCOSAL | Status: DC | PRN

## 2013-09-28 MED ORDER — KETOROLAC TROMETHAMINE 30 MG/ML IJ SOLN
INTRAMUSCULAR | Status: AC
  Filled 2013-09-28: qty 1

## 2013-09-28 MED ORDER — CALCIUM CARBONATE 600 MG PO TABS: 600.0000 mg | ORAL_TABLET | Freq: Every day | ORAL | Status: DC

## 2013-09-28 MED ORDER — FOLIC ACID 1 MG PO TABS
1.0000 mg | ORAL_TABLET | Freq: Every day | ORAL | Status: DC
  Administered 2013-09-28: 1 mg via ORAL
  Filled 2013-09-28 (×2): qty 1

## 2013-09-28 MED ORDER — PROPOFOL 10 MG/ML IV BOLUS
INTRAVENOUS | Status: AC
  Filled 2013-09-28: qty 20

## 2013-09-28 MED ORDER — OXYCODONE-ACETAMINOPHEN 5-325 MG PO TABS
1.0000 | ORAL_TABLET | ORAL | Status: DC | PRN
  Administered 2013-09-28 – 2013-09-29 (×5): 2 via ORAL
  Filled 2013-09-28 (×4): qty 2

## 2013-09-28 MED ORDER — MIDAZOLAM HCL 5 MG/5ML IJ SOLN
INTRAMUSCULAR | Status: DC | PRN
  Administered 2013-09-28: 2 mg via INTRAVENOUS

## 2013-09-28 MED ORDER — ROCURONIUM BROMIDE 50 MG/5ML IV SOLN
INTRAVENOUS | Status: AC
  Filled 2013-09-28: qty 1

## 2013-09-28 MED ORDER — PROPOFOL 10 MG/ML IV BOLUS
INTRAVENOUS | Status: DC | PRN
  Administered 2013-09-28: 200 mg via INTRAVENOUS

## 2013-09-28 MED ORDER — DEXAMETHASONE SODIUM PHOSPHATE 10 MG/ML IJ SOLN
INTRAMUSCULAR | Status: AC
  Filled 2013-09-28: qty 1

## 2013-09-28 MED ORDER — ARTIFICIAL TEARS OP OINT
TOPICAL_OINTMENT | OPHTHALMIC | Status: DC | PRN
  Administered 2013-09-28: 1 via OPHTHALMIC

## 2013-09-28 MED ORDER — DEXAMETHASONE SODIUM PHOSPHATE 10 MG/ML IJ SOLN
INTRAMUSCULAR | Status: DC | PRN
  Administered 2013-09-28: 8 mg via INTRAVENOUS

## 2013-09-28 MED ORDER — GLYCOPYRROLATE 0.2 MG/ML IJ SOLN
INTRAMUSCULAR | Status: DC | PRN
  Administered 2013-09-28: 0.6 mg via INTRAVENOUS

## 2013-09-28 SURGICAL SUPPLY — 62 items
ADH SKN CLS APL DERMABOND .7 (GAUZE/BANDAGES/DRESSINGS)
ADH SKN CLS LQ APL DERMABOND (GAUZE/BANDAGES/DRESSINGS) ×1
APL SKNCLS STERI-STRIP NONHPOA (GAUZE/BANDAGES/DRESSINGS) ×1
BAG DECANTER FOR FLEXI CONT (MISCELLANEOUS) ×3 IMPLANT
BENZOIN TINCTURE PRP APPL 2/3 (GAUZE/BANDAGES/DRESSINGS) ×3 IMPLANT
BLADE 10 SAFETY STRL DISP (BLADE) ×2 IMPLANT
BLADE SURG ROTATE 9660 (MISCELLANEOUS) IMPLANT
BRUSH SCRUB EZ PLAIN DRY (MISCELLANEOUS) ×3 IMPLANT
BUR CUTTER 7.0 ROUND (BURR) ×3 IMPLANT
CANISTER SUCT 3000ML (MISCELLANEOUS) ×3 IMPLANT
CLOSURE WOUND 1/2 X4 (GAUZE/BANDAGES/DRESSINGS) ×1
CONT SPEC 4OZ CLIKSEAL STRL BL (MISCELLANEOUS) ×3 IMPLANT
DECANTER SPIKE VIAL GLASS SM (MISCELLANEOUS) ×1 IMPLANT
DERMABOND ADHESIVE PROPEN (GAUZE/BANDAGES/DRESSINGS) ×2
DERMABOND ADVANCED (GAUZE/BANDAGES/DRESSINGS)
DERMABOND ADVANCED .7 DNX12 (GAUZE/BANDAGES/DRESSINGS) ×1 IMPLANT
DERMABOND ADVANCED .7 DNX6 (GAUZE/BANDAGES/DRESSINGS) IMPLANT
DRAPE LAPAROTOMY 100X72X124 (DRAPES) ×3 IMPLANT
DRAPE MICROSCOPE LEICA (MISCELLANEOUS) ×2 IMPLANT
DRAPE MICROSCOPE ZEISS OPMI (DRAPES) ×1 IMPLANT
DRAPE POUCH INSTRU U-SHP 10X18 (DRAPES) ×3 IMPLANT
DRAPE PROXIMA HALF (DRAPES) IMPLANT
DRAPE SURG 17X23 STRL (DRAPES) ×6 IMPLANT
DRSG OPSITE 4X5.5 SM (GAUZE/BANDAGES/DRESSINGS) ×2 IMPLANT
DRSG OPSITE POSTOP 3X4 (GAUZE/BANDAGES/DRESSINGS) ×2 IMPLANT
DURAPREP 26ML APPLICATOR (WOUND CARE) ×3 IMPLANT
ELECT REM PT RETURN 9FT ADLT (ELECTROSURGICAL) ×3
ELECTRODE REM PT RTRN 9FT ADLT (ELECTROSURGICAL) ×1 IMPLANT
GAUZE SPONGE 4X4 16PLY XRAY LF (GAUZE/BANDAGES/DRESSINGS) IMPLANT
GLOVE BIO SURGEON STRL SZ8 (GLOVE) ×2 IMPLANT
GLOVE BIOGEL PI IND STRL 7.0 (GLOVE) IMPLANT
GLOVE BIOGEL PI IND STRL 7.5 (GLOVE) IMPLANT
GLOVE BIOGEL PI INDICATOR 7.0 (GLOVE) ×2
GLOVE BIOGEL PI INDICATOR 7.5 (GLOVE) ×2
GLOVE ECLIPSE 7.0 STRL STRAW (GLOVE) ×4 IMPLANT
GLOVE ECLIPSE 9.0 STRL (GLOVE) ×3 IMPLANT
GLOVE EXAM NITRILE LRG STRL (GLOVE) IMPLANT
GLOVE EXAM NITRILE MD LF STRL (GLOVE) IMPLANT
GLOVE EXAM NITRILE XL STR (GLOVE) IMPLANT
GLOVE EXAM NITRILE XS STR PU (GLOVE) IMPLANT
GLOVE INDICATOR 8.5 STRL (GLOVE) ×2 IMPLANT
GOWN BRE IMP SLV AUR LG STRL (GOWN DISPOSABLE) IMPLANT
GOWN BRE IMP SLV AUR XL STRL (GOWN DISPOSABLE) ×7 IMPLANT
GOWN STRL REIN 2XL LVL4 (GOWN DISPOSABLE) IMPLANT
KIT BASIN OR (CUSTOM PROCEDURE TRAY) ×3 IMPLANT
KIT ROOM TURNOVER OR (KITS) ×3 IMPLANT
NDL SPNL 22GX3.5 QUINCKE BK (NEEDLE) ×1 IMPLANT
NEEDLE HYPO 22GX1.5 SAFETY (NEEDLE) ×3 IMPLANT
NEEDLE SPNL 22GX3.5 QUINCKE BK (NEEDLE) ×3 IMPLANT
NS IRRIG 1000ML POUR BTL (IV SOLUTION) ×3 IMPLANT
PACK LAMINECTOMY NEURO (CUSTOM PROCEDURE TRAY) ×3 IMPLANT
PAD ARMBOARD 7.5X6 YLW CONV (MISCELLANEOUS) ×13 IMPLANT
RUBBERBAND STERILE (MISCELLANEOUS) ×6 IMPLANT
SPONGE GAUZE 4X4 12PLY (GAUZE/BANDAGES/DRESSINGS) ×3 IMPLANT
SPONGE SURGIFOAM ABS GEL SZ50 (HEMOSTASIS) ×3 IMPLANT
STRIP CLOSURE SKIN 1/2X4 (GAUZE/BANDAGES/DRESSINGS) ×2 IMPLANT
SUT VIC AB 2-0 CT1 18 (SUTURE) ×3 IMPLANT
SUT VIC AB 3-0 SH 8-18 (SUTURE) ×3 IMPLANT
SYR 20ML ECCENTRIC (SYRINGE) ×3 IMPLANT
TOWEL OR 17X24 6PK STRL BLUE (TOWEL DISPOSABLE) ×3 IMPLANT
TOWEL OR 17X26 10 PK STRL BLUE (TOWEL DISPOSABLE) ×3 IMPLANT
WATER STERILE IRR 1000ML POUR (IV SOLUTION) ×3 IMPLANT

## 2013-09-28 NOTE — Anesthesia Preprocedure Evaluation (Addendum)
Anesthesia Evaluation  Patient identified by MRN, date of birth, ID band Patient awake    Reviewed: Allergy & Precautions, H&P , NPO status , Patient's Chart, lab work & pertinent test results, reviewed documented beta blocker date and time   Airway Mallampati: I TM Distance: >3 FB Neck ROM: full    Dental  (+) Teeth Intact, Dental Advisory Given   Pulmonary neg pulmonary ROS,  breath sounds clear to auscultation        Cardiovascular + dysrhythmias Supra Ventricular Tachycardia Rhythm:Regular     Neuro/Psych  Headaches, Anxiety Depression  Neuromuscular disease negative psych ROS   GI/Hepatic negative GI ROS, Neg liver ROS,   Endo/Other  negative endocrine ROS  Renal/GU negative Renal ROS  negative genitourinary   Musculoskeletal negative musculoskeletal ROS (+)   Abdominal (+)  Abdomen: soft. Bowel sounds: normal.  Peds  Hematology negative hematology ROS (+)   Anesthesia Other Findings See surgeon's H&P   Reproductive/Obstetrics negative OB ROS                       Anesthesia Physical Anesthesia Plan  ASA: II  Anesthesia Plan: General   Post-op Pain Management:    Induction: Intravenous  Airway Management Planned: Oral ETT  Additional Equipment:   Intra-op Plan:   Post-operative Plan: Extubation in OR  Informed Consent: I have reviewed the patients History and Physical, chart, labs and discussed the procedure including the risks, benefits and alternatives for the proposed anesthesia with the patient or authorized representative who has indicated his/her understanding and acceptance.   Dental Advisory Given  Plan Discussed with: CRNA and Surgeon  Anesthesia Plan Comments:         Anesthesia Quick Evaluation

## 2013-09-28 NOTE — Anesthesia Procedure Notes (Signed)
Procedure Name: Intubation Date/Time: 09/28/2013 10:53 AM Performed by: Maeola Harman Pre-anesthesia Checklist: Patient identified, Emergency Drugs available, Suction available, Patient being monitored and Timeout performed Patient Re-evaluated:Patient Re-evaluated prior to inductionOxygen Delivery Method: Circle system utilized Preoxygenation: Pre-oxygenation with 100% oxygen Intubation Type: IV induction Ventilation: Mask ventilation without difficulty Laryngoscope Size: Mac and 3 Grade View: Grade I Tube type: Oral Tube size: 7.5 mm Number of attempts: 1 Airway Equipment and Method: Stylet Placement Confirmation: ETT inserted through vocal cords under direct vision,  positive ETCO2 and breath sounds checked- equal and bilateral Secured at: 21 cm Tube secured with: Tape Dental Injury: Teeth and Oropharynx as per pre-operative assessment  Comments: Easy atraumatic induction and intubation with MAC 3 blade.  Dr. Albertina Parr verified placement of ETT.  Waldron Session, CRNA

## 2013-09-28 NOTE — Brief Op Note (Signed)
09/28/2013  11:57 AM  PATIENT:  Kimberly Torres  33 y.o. female  PRE-OPERATIVE DIAGNOSIS:  HNP  POST-OPERATIVE DIAGNOSIS:  Herniated Nucleous Pulposus  PROCEDURE:  Procedure(s) with comments: LUMBAR LAMINECTOMY/DECOMPRESSION MICRODISCECTOMY 1 LEVEL,LUMBAR  THREE-FOUR RIGHT  (Right) - right  SURGEON:  Surgeon(s) and Role:    * Charlie Pitter, MD - Primary    * Elaina Hoops, MD - Assisting  PHYSICIAN ASSISTANT:   ASSISTANTS:    ANESTHESIA:   general  EBL:  Total I/O In: 1000 [I.V.:1000] Out: 150 [Blood:150]  BLOOD ADMINISTERED:none  DRAINS: none   LOCAL MEDICATIONS USED:  MARCAINE     SPECIMEN:  No Specimen  DISPOSITION OF SPECIMEN:  N/A  COUNTS:  YES  TOURNIQUET:  * No tourniquets in log *  DICTATION: .Dragon Dictation  PLAN OF CARE: Admit for overnight observation  PATIENT DISPOSITION:  PACU - hemodynamically stable.   Delay start of Pharmacological VTE agent (>24hrs) due to surgical blood loss or risk of bleeding: yes

## 2013-09-28 NOTE — Transfer of Care (Signed)
Immediate Anesthesia Transfer of Care Note  Patient: Kimberly Torres  Procedure(s) Performed: Procedure(s) with comments: LUMBAR LAMINECTOMY/DECOMPRESSION MICRODISCECTOMY 1 LEVEL,LUMBAR  THREE-FOUR RIGHT  (Right) - right  Patient Location: PACU  Anesthesia Type:General  Level of Consciousness: awake, alert  and oriented  Airway & Oxygen Therapy: Patient connected to face mask oxygen  Post-op Assessment: Report given to PACU RN  Post vital signs: stable  Complications: No apparent anesthesia complications

## 2013-09-28 NOTE — H&P (Signed)
Kimberly Torres is an 33 y.o. female.   Chief Complaint: Back pain HPI: 33 year old female with a three-month history of severe lumbar pain with intermittent radiation into her lower extremities right greater than left. Symptoms have failed conservative management. Workup demonstrates evidence of a right paracentral disc herniation at L3-4. Tissue presents now for laminotomy and microdiscectomy in hopes of improving her symptoms.  Past Medical History  Diagnosis Date  . Depression   . Anxiety   . Chondromalacia of right knee     right  . Sinus tachycardia     eval by Dr. Stanford Breed in 2007: monior revealed sinus tach. vs EAT; Echo 10/07: EF 70%  . Varicose veins     eval. by vascular surgeon and procedure scheduled to correct  . Insomnia   . Acne     sees derm- on accutane  . Dysrhythmia     hx SVT  . H/O seasonal allergies   . Headache(784.0)     cluster migraines    Past Surgical History  Procedure Laterality Date  . Myringotomy    . Cesarean section  2009  . Knee surgery  04/10/2010  . Diagnostic laparoscopy  2010    scar tissue    Family History  Problem Relation Age of Onset  . Coronary artery disease      grandfather  . Heart attack      grandfather  . Breast cancer      grandmother  . Bipolar disorder      grandmother   Social History:  reports that she has never smoked. She has never used smokeless tobacco. She reports that she drinks alcohol. She reports that she does not use illicit drugs.  Allergies:  Allergies  Allergen Reactions  . Escitalopram Oxalate     REACTION: did not help  . Venlafaxine     REACTION: hypomania    Medications Prior to Admission  Medication Sig Dispense Refill  . amphetamine-dextroamphetamine (ADDERALL XR) 30 MG 24 hr capsule Take 30 mg by mouth daily.      Marland Kitchen amphetamine-dextroamphetamine (ADDERALL) 20 MG tablet Take 20 mg by mouth daily at 12 noon.      . chlorpheniramine (CHLOR-TRIMETON) 4 MG tablet Take 4 mg by mouth  daily as needed for allergies.      . clonazePAM (KLONOPIN) 1 MG tablet Take 2 mg by mouth at bedtime.       . cyclobenzaprine (FLEXERIL) 10 MG tablet Take 10 mg by mouth at bedtime as needed for muscle spasms.      . folic acid (FOLVITE) 1 MG tablet Take 1 mg by mouth daily.      Marland Kitchen HYDROcodone-acetaminophen (NORCO) 10-325 MG per tablet Take 1-2 tablets by mouth every 4 (four) hours as needed for moderate pain.      . ISOtretinoin (ABSORICA) 40 MG capsule Take 40 mg by mouth 2 (two) times daily.      Marland Kitchen lamoTRIgine (LAMICTAL) 150 MG tablet Take 150 mg by mouth daily.        . Norethindrone-Ethinyl Estradiol-Fe Biphas (LO LOESTRIN FE) 1 MG-10 MCG / 10 MCG tablet Take 1 tablet by mouth daily.      . polyethylene glycol (MIRALAX / GLYCOLAX) packet Take 17 g by mouth daily as needed for mild constipation.      . predniSONE (DELTASONE) 10 MG tablet Take 20-40 mg by mouth daily with breakfast. 10mg  dose pack.      Marland Kitchen zolpidem (AMBIEN) 10 MG tablet Take 5 mg  by mouth at bedtime as needed for sleep.       . calcium carbonate (OS-CAL) 600 MG TABS Take 600 mg by mouth daily with breakfast.       . fluticasone (FLONASE) 50 MCG/ACT nasal spray Place 2 sprays into both nostrils daily as needed for allergies.         No results found for this or any previous visit (from the past 48 hour(s)). No results found.  Review of Systems  Constitutional: Negative.   HENT: Negative.   Eyes: Negative.   Respiratory: Negative.   Cardiovascular: Negative.   Gastrointestinal: Negative.   Genitourinary: Negative.   Musculoskeletal: Negative.   Skin: Negative.   Neurological: Negative.   Endo/Heme/Allergies: Negative.   Psychiatric/Behavioral: Negative.     Blood pressure 135/87, pulse 103, temperature 98.2 F (36.8 C), temperature source Oral, resp. rate 20, weight 71.215 kg (157 lb), last menstrual period 07/25/2013, SpO2 98.00%. Physical Exam  Constitutional: She is oriented to person, place, and time. She  appears well-developed and well-nourished. No distress.  HENT:  Head: Normocephalic and atraumatic.  Right Ear: External ear normal.  Left Ear: External ear normal.  Nose: Nose normal.  Mouth/Throat: Oropharynx is clear and moist. No oropharyngeal exudate.  Eyes: Conjunctivae and EOM are normal. Pupils are equal, round, and reactive to light. Right eye exhibits no discharge. Left eye exhibits no discharge.  Neck: Normal range of motion. Neck supple. No tracheal deviation present. No thyromegaly present.  Cardiovascular: Normal rate, regular rhythm, normal heart sounds and intact distal pulses.  Exam reveals no friction rub.   No murmur heard. Respiratory: Breath sounds normal. No respiratory distress. She has no wheezes.  GI: Soft. Bowel sounds are normal. She exhibits no distension. There is no tenderness.  Musculoskeletal: Normal range of motion. She exhibits tenderness (Lumbar). She exhibits no edema.  Neurological: She is alert and oriented to person, place, and time. She has normal reflexes. She displays normal reflexes. No cranial nerve deficit. She exhibits normal muscle tone. Coordination normal.  Skin: Skin is warm and dry. She is not diaphoretic.  Psychiatric: She has a normal mood and affect. Her behavior is normal. Judgment and thought content normal.     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 Plan right L3-4 laminotomy and microdiscectomy. Risks and benefits have been explained. Patient wishes to proceed.  Mallie Mussel A Shelma Eiben 09/28/2013, 10:31 AM

## 2013-09-28 NOTE — Anesthesia Postprocedure Evaluation (Signed)
Anesthesia Post Note  Patient: Kimberly Torres  Procedure(s) Performed: Procedure(s) (LRB): LUMBAR LAMINECTOMY/DECOMPRESSION MICRODISCECTOMY 1 LEVEL,LUMBAR  THREE-FOUR RIGHT  (Right)  Anesthesia type: General  Patient location: PACU  Post pain: Pain level controlled  Post assessment: Patient's Cardiovascular Status Stable  Last Vitals:  Filed Vitals:   09/28/13 1230  BP:   Pulse: 111  Temp:   Resp: 19    Post vital signs: Reviewed and stable  Level of consciousness: alert  Complications: No apparent anesthesia complications

## 2013-09-28 NOTE — Op Note (Signed)
Date of procedure: 09/28/2013  Date of dictation: Same  Service: Neurosurgery  Preoperative diagnosis: Right L3-4 paracentral disc herniation with back pain and radiculopathy  Postoperative diagnosis: Same  Procedure Name: Right L3-L4 laminotomy and microdiscectomy  Surgeon:Maryhelen Lindler A.Brayah Urquilla, M.D.  Asst. Surgeon: Saintclair Halsted  Anesthesia: General  Indication: 33 year old female with severe back and lower extremity pain failing conservative measure workup demonstrates evidence of a paracentral disc herniation at L3-4. Patient presents now for microdiscectomy in hopes of improving her symptoms.  Operative note: After induction anesthesia, patient positioned prone on the Wilson frame and appropriately padded. Lumbar region prepped and draped. Incision made overlying L3-4. Dissection performed the right side. Retractor placed. X-ray taken. Level confirmed. Laminotomy performed using high-speed drill and Kerrison rongeurs. Ligament flavum elevated and resected so fashion. Underlying thecal sac and L4 nerve root identified. Marked for brought field these might this section of the spinal canal. Thecal sac and L4 nerve root gently mobilized and retracted towards midline. Epidural venous plexus was quite limited and cut. Disc space and disc herniation readily apparent. Disc spaces incised with 15 blade a rectangular fashion. Wide disc space cleanout was achieved using pituitary rongeurs operative of pituitary rongeurs and Epstein curettes. All elements the disc herniation were completely resect. All loose were obviously degenerative dispersement and interspace. This point a very thorough discectomy been achieved. There is no evidence of injury to thecal sac and nerve roots. Wound is then irrigated and bike solution. Gelfoam was placed topically for hemostasis which Ascent be good. Microscope and retractor system were removed. Hemostasis of the muscle achieved with Eulas Post was and close in layers with Vicryl sutures.  Steri-Strips and sterile dressing were applied. There were no apparent competitions. Patient tolerated the procedure well and she returns to the recovery room postop.

## 2013-09-29 ENCOUNTER — Encounter (HOSPITAL_COMMUNITY): Payer: Self-pay | Admitting: Neurosurgery

## 2013-09-29 MED ORDER — OXYCODONE-ACETAMINOPHEN 5-325 MG PO TABS: 1.0000 | ORAL_TABLET | ORAL | Status: DC | PRN

## 2013-09-29 MED ORDER — CYCLOBENZAPRINE HCL 10 MG PO TABS: 10.0000 mg | ORAL_TABLET | Freq: Every evening | ORAL | Status: DC | PRN

## 2013-09-29 NOTE — Progress Notes (Signed)
Pt doing well. Pt given D/C instructions with Rx's, verbal understanding of teaching was given. Pt's IV was removed prior to D/C. Pt D/C'd home via wheelchair @ 1125 per MD order. Pt is stable and has no other needs. Holli Humbles, RN

## 2013-09-29 NOTE — Discharge Instructions (Signed)

## 2013-09-29 NOTE — Discharge Summary (Signed)
Physician Discharge Summary  Patient ID: MAUDEAN HOFFMANN MRN: [id number] DOB/AGE: [birth year] 33 y.o.  Admit date: 09/28/2013 Discharge date: 09/29/2013  Admission Diagnoses:  Discharge Diagnoses:  Principal Problem:   HNP (herniated nucleus pulposus), lumbar Active Problems:   Lumbar disc herniation with radiculopathy   Discharged Condition: good  Hospital Course: Patient admitted to the hospital where she underwent an uncomplicated right-sided L3 for laminotomy and microdiscectomy. Postoperative she is doing well. Back and lower extremity symptoms improved. Up ambulating without difficulty. Ready for discharge home.  Consults:   Significant Diagnostic Studies:   Treatments:   Discharge Exam: Blood pressure 99/65, pulse 104, temperature 98.7 F (37.1 C), temperature source Oral, resp. rate 18, weight 71.215 kg (157 lb), last menstrual period 07/25/2013, SpO2 100.00%. Awake and alert. Oriented and appropriate. Motor and sensory function intact. Wound clean and dry. Chest and abdomen benign.  Disposition: 01-Home or Self Care     Medication List         ABSORICA 40 MG capsule  Generic drug:  ISOtretinoin  Take 40 mg by mouth 2 (two) times daily.     amphetamine-dextroamphetamine 30 MG 24 hr capsule  Commonly known as:  ADDERALL XR  Take 30 mg by mouth daily.     amphetamine-dextroamphetamine 20 MG tablet  Commonly known as:  ADDERALL  Take 20 mg by mouth daily at 12 noon.     calcium carbonate 600 MG Tabs tablet  Commonly known as:  OS-CAL  Take 600 mg by mouth daily with breakfast.     chlorpheniramine 4 MG tablet  Commonly known as:  CHLOR-TRIMETON  Take 4 mg by mouth daily as needed for allergies.     clonazePAM 1 MG tablet  Commonly known as:  KLONOPIN  Take 2 mg by mouth at bedtime.     cyclobenzaprine 10 MG tablet  Commonly known as:  FLEXERIL  Take 1 tablet (10 mg total) by mouth at bedtime as needed for muscle spasms.     fluticasone 50  MCG/ACT nasal spray  Commonly known as:  FLONASE  Place 2 sprays into both nostrils daily as needed for allergies.     folic acid 1 MG tablet  Commonly known as:  FOLVITE  Take 1 mg by mouth daily.     HYDROcodone-acetaminophen 10-325 MG per tablet  Commonly known as:  NORCO  Take 1-2 tablets by mouth every 4 (four) hours as needed for moderate pain.     lamoTRIgine 150 MG tablet  Commonly known as:  LAMICTAL  Take 150 mg by mouth daily.     LO LOESTRIN FE 1 MG-10 MCG / 10 MCG tablet  Generic drug:  Norethindrone-Ethinyl Estradiol-Fe Biphas  Take 1 tablet by mouth daily.     oxyCODONE-acetaminophen 5-325 MG per tablet  Commonly known as:  PERCOCET/ROXICET  Take 1-2 tablets by mouth every 4 (four) hours as needed for moderate pain.     polyethylene glycol packet  Commonly known as:  MIRALAX / GLYCOLAX  Take 17 g by mouth daily as needed for mild constipation.     predniSONE 10 MG tablet  Commonly known as:  DELTASONE  Take 20-40 mg by mouth daily with breakfast. 10mg  dose pack.     zolpidem 10 MG tablet  Commonly known as:  AMBIEN  Take 5 mg by mouth at bedtime as needed for sleep.         Signed: Cooper Render Vlada Uriostegui 09/29/2013, 9:56 AM

## 2013-10-09 ENCOUNTER — Other Ambulatory Visit: Payer: Self-pay | Admitting: Neurosurgery

## 2013-10-09 ENCOUNTER — Ambulatory Visit
Admission: RE | Admit: 2013-10-09 | Discharge: 2013-10-09 | Disposition: A | Discharge status: Home / Self Care | Payer: 59 | Source: Ambulatory Visit | Attending: Neurosurgery | Admitting: Neurosurgery

## 2013-10-09 DIAGNOSIS — M25473 Effusion, unspecified ankle: Secondary | ICD-10-CM

## 2013-10-12 ENCOUNTER — Other Ambulatory Visit: Payer: 59

## 2013-11-03 ENCOUNTER — Encounter: Payer: Self-pay | Admitting: Cardiology

## 2013-12-14 ENCOUNTER — Other Ambulatory Visit: Payer: Self-pay | Admitting: Neurosurgery

## 2013-12-14 LAB — HCG, QUANTITATIVE, PREGNANCY

## 2013-12-16 ENCOUNTER — Ambulatory Visit: Payer: Self-pay | Admitting: Neurosurgery

## 2014-02-15 ENCOUNTER — Encounter: Payer: Self-pay | Admitting: Family Medicine

## 2014-04-06 ENCOUNTER — Emergency Department: Payer: Self-pay | Admitting: Emergency Medicine

## 2014-04-06 LAB — CBC
HCT: 37.7 % (ref 35.0–47.0)
HGB: 12.7 g/dL (ref 12.0–16.0)
MCH: 31.8 pg (ref 26.0–34.0)
MCHC: 33.8 g/dL (ref 32.0–36.0)
MCV: 94 fL (ref 80–100)
Platelet: 210 10*3/uL (ref 150–440)
RBC: 4.01 10*6/uL (ref 3.80–5.20)
RDW: 12.1 % (ref 11.5–14.5)
WBC: 5.7 10*3/uL (ref 3.6–11.0)

## 2014-04-06 LAB — BASIC METABOLIC PANEL
Anion Gap: 6 — ABNORMAL LOW (ref 7–16)
BUN: 12 mg/dL (ref 7–18)
Calcium, Total: 8.1 mg/dL — ABNORMAL LOW (ref 8.5–10.1)
Chloride: 107 mmol/L (ref 98–107)
Co2: 28 mmol/L (ref 21–32)
Creatinine: 0.76 mg/dL (ref 0.60–1.30)
EGFR (African American): >60
GLUCOSE: 91 mg/dL (ref 65–99)
Osmolality: 281 (ref 275–301)
POTASSIUM: 4.1 mmol/L (ref 3.5–5.1)
Sodium: 141 mmol/L (ref 136–145)

## 2014-04-06 LAB — HCG, QUANTITATIVE, PREGNANCY

## 2014-04-06 LAB — TROPONIN I

## 2014-04-08 ENCOUNTER — Ambulatory Visit: Payer: 59 | Admitting: Cardiovascular Disease

## 2014-04-08 ENCOUNTER — Encounter: Payer: Self-pay | Admitting: Cardiovascular Disease

## 2014-04-08 ENCOUNTER — Ambulatory Visit (INDEPENDENT_AMBULATORY_CARE_PROVIDER_SITE_OTHER): Payer: 59 | Admitting: Cardiovascular Disease

## 2014-04-08 VITALS — BP 129/93 | HR 110 | Ht 67.0 in | Wt 154.2 lb

## 2014-04-08 DIAGNOSIS — R0602 Shortness of breath: Secondary | ICD-10-CM

## 2014-04-08 DIAGNOSIS — R000 Tachycardia, unspecified: Secondary | ICD-10-CM

## 2014-04-08 DIAGNOSIS — I471 Supraventricular tachycardia: Secondary | ICD-10-CM

## 2014-04-08 MED ORDER — ATENOLOL 25 MG PO TABS: 25.0000 mg | ORAL_TABLET | Freq: Every day | ORAL | Status: DC

## 2014-04-08 NOTE — Patient Instructions (Signed)
Your physician has requested that you have an echocardiogram. Echocardiography is a painless test that uses sound waves to create images of your heart. It provides your doctor with information about the size and shape of your heart and how well your heart's chambers and valves are working. This procedure takes approximately one hour. There are no restrictions for this procedure.  Your physician has recommended you make the following change in your medication:  Start Atenolol 25 mg once daily   Your physician recommends that you schedule a follow-up appointment in:  1 month   Your next appointment will be scheduled in our new office located at :  [facility]  [address], [place], [place] [address]  Increase your sodium intake as discussed

## 2014-04-09 DIAGNOSIS — R000 Tachycardia, unspecified: Secondary | ICD-10-CM | POA: Insufficient documentation

## 2014-04-09 NOTE — Progress Notes (Signed)
Primary care physician: Dr. Glori Bickers  HPI  This is a pleasant 33 year old female who was referred from [facility] emergency room for evaluation of palpitations and tachycardia. She had been seen by Dr. Stanford Breed and Dr. Caryl Comes in 2012 for similar complaints that occurred in the setting of significant psychosocial stress. Echo cardiogram at that time was normal.  She was diagnosed with inappropriate sinus tachycardia and was started on atenolol with subsequent improvement in symptoms. Subsequently, she stopped the medication. She has chronic anxiety and depression. She was started on Adderall 6 months ago. She stopped taking the medication recently to see if it was causing her tachycardia. There was no significant change in symptoms since then. She reports continuous sinus tachycardia which gives her an uncomfortable feeling. She denies any chest discomfort. She does complain of dizziness but no syncope or presyncope. She reports worsening dyspnea. She cut down on caffeine intake with no significant improvement in symptoms. There is no family history of premature coronary artery disease or arrhythmia. She is not a smoker. Recent workup in the ED showed sinus tachycardia. Chest x-ray was normal. Labs were unremarkable. She has no thyroid disease.  Allergies  Allergen Reactions  . Escitalopram Oxalate     REACTION: did not help  . Venlafaxine     REACTION: hypomania     Current Outpatient Prescriptions on File Prior to Visit  Medication Sig Dispense Refill  . calcium carbonate (OS-CAL) 600 MG TABS Take 600 mg by mouth daily with breakfast.     . chlorpheniramine (CHLOR-TRIMETON) 4 MG tablet Take 4 mg by mouth daily as needed for allergies.    . clonazePAM (KLONOPIN) 1 MG tablet Take 1 mg by mouth at bedtime.     . fluticasone (FLONASE) 50 MCG/ACT nasal spray Place 2 sprays into both nostrils daily as needed for allergies.     . folic acid (FOLVITE) 1 MG tablet Take 1 mg by mouth daily.    Marland Kitchen lamoTRIgine  (LAMICTAL) 150 MG tablet Take 150 mg by mouth daily.      . Norethindrone-Ethinyl Estradiol-Fe Biphas (LO LOESTRIN FE) 1 MG-10 MCG / 10 MCG tablet Take 1 tablet by mouth daily.    . polyethylene glycol (MIRALAX / GLYCOLAX) packet Take 17 g by mouth daily as needed for mild constipation.    Marland Kitchen zolpidem (AMBIEN) 10 MG tablet Take 5 mg by mouth at bedtime as needed for sleep.     Marland Kitchen amphetamine-dextroamphetamine (ADDERALL XR) 30 MG 24 hr capsule Take 30 mg by mouth daily.    Marland Kitchen amphetamine-dextroamphetamine (ADDERALL) 20 MG tablet Take 20 mg by mouth daily at 12 noon.     No current facility-administered medications on file prior to visit.     Past Medical History  Diagnosis Date  . Depression   . Anxiety   . Chondromalacia of right knee     right  . Sinus tachycardia     eval by Dr. Stanford Breed in 2007: monior revealed sinus tach. vs EAT; Echo 10/07: EF 70%  . Varicose veins     eval. by vascular surgeon and procedure scheduled to correct  . Insomnia   . Acne     sees derm- on accutane  . Dysrhythmia     hx SVT  . H/O seasonal allergies   . Headache(784.0)     cluster migraines     Past Surgical History  Procedure Laterality Date  . Myringotomy    . Cesarean section  2009  . Knee surgery  04/10/2010  .  Diagnostic laparoscopy  2010    scar tissue  . Lumbar laminectomy/decompression microdiscectomy Right 09/28/2013    Procedure: LUMBAR LAMINECTOMY/DECOMPRESSION MICRODISCECTOMY 1 LEVEL,LUMBAR  THREE-FOUR RIGHT ;  Surgeon: Charlie Pitter, MD;  Location: [place] [facility];  Service: Neurosurgery;  Laterality: Right;  right     Family History  Problem Relation Age of Onset  . Coronary artery disease      grandfather  . Heart attack      grandfather  . Breast cancer      grandmother  . Bipolar disorder      grandmother  . Hypertension Mother      History   Social History  . Marital Status: Married    Spouse Name: N/A    Number of Children: N/A  . Years of Education: N/A    Occupational History  . 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 Smoking status: Never Smoker   . Smokeless tobacco: Never Used  . Alcohol Use: Yes     Comment: occasional to rare  . Drug Use: No  . Sexual Activity: Yes    Birth Control/ Protection: None   Other Topics Concern  . Not on file   Social History Narrative   Family History:   GF CAD with MI in his 50s   MGM breast cancer in her 29s    GM with bipolar      Social History:   Marital Status: Married   Children: 1   Occupation: Software engineer   Her husband is a Engineer, water           ROS A 10 point review of system was performed. It is negative other than that mentioned in the history of present illness.   PHYSICAL EXAM   BP 129/93 mmHg  Pulse 110  Ht 5\' 7"  (1.702 m)  Wt 154 lb 4 oz (69.967 kg)  BMI 24.15 kg/m2 Constitutional: She is oriented to person, place, and time. She appears well-developed and well-nourished. No distress.  HENT: No nasal discharge.  Head: Normocephalic and atraumatic.  Eyes: Pupils are equal and round. No discharge.  Neck: Normal range of motion. Neck supple. No JVD present. No thyromegaly present.  Cardiovascular: Tachycardic, regular rhythm, normal heart sounds. Exam reveals no gallop and no friction rub. No murmur heard.  Pulmonary/Chest: Effort normal and breath sounds normal. No stridor. No respiratory distress. She has no wheezes. She has no rales. She exhibits no tenderness.  Abdominal: Soft. Bowel sounds are normal. She exhibits no distension. There is no tenderness. There is no rebound and no guarding.  Musculoskeletal: Normal range of motion. She exhibits no edema and no tenderness.  Neurological: She is alert and oriented to person, place, and time. Coordination normal.  Skin: Skin is warm and dry. No rash noted. She is not diaphoretic. No erythema. No pallor.  Psychiatric: She has a normal mood and affect. Her behavior is normal. Judgment and  thought content normal.     QMV:HQION tachycardia with no significant ST or T wave changes. Normal PR and QT intervals   ASSESSMENT AND PLAN

## 2014-04-09 NOTE — Assessment & Plan Note (Signed)
The patient likely has inappropriate sinus tachycardia worsened by situational anxiety. She reports worsening dyspnea and thus I requested an echocardiogram. She already cut down on caffeine intake. Stopping Adderall did not improve her symptoms. She responded well in the past to atenolol and thus I prescribed this at 25 mg once daily. I asked her to increase her fluid and salt intake. Follow-up in one month.

## 2014-04-21 ENCOUNTER — Other Ambulatory Visit: Payer: Self-pay

## 2014-04-21 ENCOUNTER — Other Ambulatory Visit (INDEPENDENT_AMBULATORY_CARE_PROVIDER_SITE_OTHER): Payer: 59

## 2014-04-21 DIAGNOSIS — R0602 Shortness of breath: Secondary | ICD-10-CM

## 2014-04-22 ENCOUNTER — Ambulatory Visit: Payer: 59 | Admitting: Cardiovascular Disease

## 2014-05-04 ENCOUNTER — Encounter: Payer: 59 | Admitting: Internal Medicine

## 2014-05-10 ENCOUNTER — Ambulatory Visit (INDEPENDENT_AMBULATORY_CARE_PROVIDER_SITE_OTHER): Payer: 59 | Admitting: Cardiovascular Disease

## 2014-05-10 ENCOUNTER — Encounter: Payer: Self-pay | Admitting: Cardiovascular Disease

## 2014-05-10 VITALS — BP 122/78 | HR 107 | Ht 67.0 in | Wt 158.8 lb

## 2014-05-10 DIAGNOSIS — R000 Tachycardia, unspecified: Secondary | ICD-10-CM

## 2014-05-10 DIAGNOSIS — I471 Supraventricular tachycardia: Secondary | ICD-10-CM

## 2014-05-10 NOTE — Assessment & Plan Note (Signed)
Symptoms improved significantly with small dose of atenolol. Continue same treatment.

## 2014-05-10 NOTE — Patient Instructions (Signed)
Continue same medications.   Your physician wants you to follow-up in: 6 months.  You will receive a reminder letter in the mail two months in advance. If you don't receive a letter, please call our office to schedule the follow-up appointment.  

## 2014-05-10 NOTE — Progress Notes (Signed)
Primary care physician: Dr. Glori Bickers  HPI  This is a pleasant 33 year old female who is here today for a follow-up visit regarding palpitations and tachycardia. She had been seen by Dr. Stanford Breed and Dr. Caryl Comes in 2012 for similar complaints that occurred in the setting of significant psychosocial stress. Echocardiogram at that time was normal.  She was diagnosed with inappropriate sinus tachycardia and was started on atenolol with subsequent improvement in symptoms. Subsequently, she stopped the medication. She has chronic anxiety and depression. She was started on Adderall 6 months ago. She stopped taking the medication recently to see if it was causing her tachycardia. There was no significant change in symptoms since then.  I repeated echocardiogram which was normal. I resumed atenolol 25 mg once daily. She reports significant improvement in symptoms.   Allergies  Allergen Reactions  . Escitalopram Oxalate     REACTION: did not help  . Venlafaxine     REACTION: hypomania     Current Outpatient Prescriptions on File Prior to Visit  Medication Sig Dispense Refill  . amphetamine-dextroamphetamine (ADDERALL XR) 30 MG 24 hr capsule Take 30 mg by mouth daily.    Marland Kitchen amphetamine-dextroamphetamine (ADDERALL) 20 MG tablet Take 20 mg by mouth daily at 12 noon.    Marland Kitchen atenolol (TENORMIN) 25 MG tablet Take 1 tablet (25 mg total) by mouth daily. 180 tablet 3  . calcium carbonate (OS-CAL) 600 MG TABS Take 600 mg by mouth daily with breakfast.     . chlorpheniramine (CHLOR-TRIMETON) 4 MG tablet Take 4 mg by mouth daily as needed for allergies.    . clonazePAM (KLONOPIN) 1 MG tablet Take 1 mg by mouth at bedtime.     . fluticasone (FLONASE) 50 MCG/ACT nasal spray Place 2 sprays into both nostrils daily as needed for allergies.     . folic acid (FOLVITE) 1 MG tablet Take 1 mg by mouth daily.    Marland Kitchen ibuprofen (ADVIL,MOTRIN) 600 MG tablet Take 600 mg by mouth 2 (two) times daily as needed.    . lamoTRIgine  (LAMICTAL) 150 MG tablet Take 150 mg by mouth daily.      . Norethindrone-Ethinyl Estradiol-Fe Biphas (LO LOESTRIN FE) 1 MG-10 MCG / 10 MCG tablet Take 1 tablet by mouth daily.    . polyethylene glycol (MIRALAX / GLYCOLAX) packet Take 17 g by mouth daily as needed for mild constipation.    Marland Kitchen zolpidem (AMBIEN) 10 MG tablet Take 5 mg by mouth at bedtime as needed for sleep.      No current facility-administered medications on file prior to visit.     Past Medical History  Diagnosis Date  . Depression   . Anxiety   . Chondromalacia of right knee     right  . Sinus tachycardia     eval by Dr. Stanford Breed in 2007: monior revealed sinus tach. vs EAT; Echo 10/07: EF 70%  . Varicose veins     eval. by vascular surgeon and procedure scheduled to correct  . Insomnia   . Acne     sees derm- on accutane  . Dysrhythmia     hx SVT  . H/O seasonal allergies   . Headache(784.0)     cluster migraines     Past Surgical History  Procedure Laterality Date  . Myringotomy    . Cesarean section  2009  . Knee surgery  04/10/2010  . Diagnostic laparoscopy  2010    scar tissue  . Lumbar laminectomy/decompression microdiscectomy Right 09/28/2013  Procedure: LUMBAR LAMINECTOMY/DECOMPRESSION MICRODISCECTOMY 1 LEVEL,LUMBAR  THREE-FOUR RIGHT ;  Surgeon: Charlie Pitter, MD;  Location: [place] [facility];  Service: Neurosurgery;  Laterality: Right;  right     Family History  Problem Relation Age of Onset  . Coronary artery disease      grandfather  . Heart attack      grandfather  . Breast cancer      grandmother  . Bipolar disorder      grandmother  . Hypertension Mother      History   Social History  . Marital Status: Married    Spouse Name: N/A    Number of Children: N/A  . Years of Education: N/A   Occupational History  . 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 Smoking status: Never Smoker   . Smokeless tobacco: Never Used  . Alcohol Use: Yes     Comment: occasional to  rare  . Drug Use: No  . Sexual Activity: Yes    Birth Control/ Protection: None   Other Topics Concern  . 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 It is negative other than that mentioned in the history of present illness.   PHYSICAL EXAM   BP 122/78 mmHg  Pulse 107  Ht 5\' 7"  (1.702 m)  Wt 158 lb 12 oz (72.009 kg)  BMI 24.86 kg/m2 Constitutional: She is oriented to person, place, and time. She appears well-developed and well-nourished. No distress.  HENT: No nasal discharge.  Head: Normocephalic and atraumatic.  Eyes: Pupils are equal and round. No discharge.  Neck: Normal range of motion. Neck supple. No JVD present. No thyromegaly present.  Cardiovascular: Tachycardic, regular rhythm, normal heart sounds. Exam reveals no gallop and no friction rub. No murmur heard.  Pulmonary/Chest: Effort normal and breath sounds normal. No stridor. No respiratory distress. She has no wheezes. She has no rales. She exhibits no tenderness.  Abdominal: Soft. Bowel sounds are normal. She exhibits no distension. There is no tenderness. There is no rebound and no guarding.  Musculoskeletal: Normal range of motion. She exhibits no edema and no tenderness.  Neurological: She is alert and oriented to person, place, and time. Coordination normal.  Skin: Skin is warm and dry. No rash noted. She is not diaphoretic. No erythema. No pallor.  Psychiatric: She has a normal mood and affect. Her behavior is normal. Judgment and thought content normal.     EKG: Sinus  Tachycardia  -Short PR syndrome  PRi = 118 Voltage criteria for LVH  (R(aVF) exceeds 1.50 mV).   -Nonspecific ST depression  -Seen with left ventricular  hypertrophy (strain) or digitalis effect.   ABNORMAL    ASSESSMENT AND PLAN

## 2014-05-13 ENCOUNTER — Telehealth: Payer: 59 | Admitting: Physician Assistant

## 2014-05-13 DIAGNOSIS — M545 Low back pain: Secondary | ICD-10-CM

## 2014-05-13 MED ORDER — MELOXICAM 15 MG PO TABS: 15.0000 mg | ORAL_TABLET | Freq: Every day | ORAL | Status: DC

## 2014-05-13 MED ORDER — CYCLOBENZAPRINE HCL 10 MG PO TABS: 10.0000 mg | ORAL_TABLET | Freq: Three times a day (TID) | ORAL | Status: DC | PRN

## 2014-05-13 NOTE — Progress Notes (Signed)
We are sorry that you are not feeling well.  Here is how we plan to help!  Based on what you have shared with me it looks like you mostly have acute back pain.  Acute back pain is defined as musculoskeletal pain that can resolve in 1-3 weeks with conservative treatment.  I have prescribed Mobic 15 mg once daily which is a non-steroid anti-inflammatory (NSAID) as well as Flexeril 10 mg every eight hours as needed which is a muscle relaxer.  Some patients experience stomach irritation or in increased heartburn with anti-inflammatory drugs.  Please keep in mind that muscle relaxer's can cause fatigue and should not be taken while at work or driving.  Back pain is very common.  The pain often gets better over time.  The cause of back pain is usually not dangerous.  Most people can learn to manage their back pain on their own.  Home Care  Stay active.  Start with short walks on flat ground if you can.  Try to walk farther each day.  Do not sit, drive or stand in one place for more than 30 minutes.  Do not stay in bed.  Do not avoid exercise or work.  Activity can help your back heal faster.  Be careful when you bend or lift an object.  Bend at your knees, keep the object close to you, and do not twist.  Sleep on a firm mattress.  Lie on your side, and bend your knees.  If you lie on your back, put a pillow under your knees.  Only take medicines as told by your doctor.  Put ice on the injured area.  Put ice in a plastic bag  Place a towel between your skin and the bag  Leave the ice on for 15-20 minutes, 3-4 times a day for the first 2-3 days.  After that, you can switch between ice and heat packs.  Ask your doctor about back exercises or massage.  Avoid feeling anxious or stressed.  Find good ways to deal with stress, such as exercise.  Get Help Right Way If:  Your pain does not go away with rest or medicine.  Your pain does not go away in 1 week.  You have new problems.  You do  not feel well.  The pain spreads into your legs.  You cannot control when you poop (bowel movement) or pee (urinate)  You feel sick to your stomach (nauseous) or throw up (vomit)  You have belly (abdominal) pain.  You feel like you may pass out (faint).  If you develop a fever.  Make Sure you:  Understand these instructions.  Will watch your condition  Will get help right away if you are not doing well or get worse.  Your e-visit answers were reviewed by a board certified advanced clinical practitioner to complete your personal care plan.  Depending on the condition, your plan could have included both over the counter or prescription medications.  Please review your pharmacy choice.  If there is a problem, you may call our nursing hot line at [phone] and have the prescription routed to another pharmacy.  Your safety is important to [place].  If you have drug allergies check your prescription carefully.    You can use MyChart to ask questions about today's visit, request a non-urgent call back, or ask for a work or school excuse.  You will get an e-mail in the next two days asking about your experience.  I hope  that your e-visit has been valuable and will speed your recovery. Thank you for using e-visits.

## 2014-07-08 ENCOUNTER — Encounter: Payer: Self-pay | Admitting: Family Medicine

## 2014-07-08 ENCOUNTER — Telehealth: Payer: Self-pay | Admitting: Family Medicine

## 2014-07-08 DIAGNOSIS — G44021 Chronic cluster headache, intractable: Secondary | ICD-10-CM

## 2014-07-08 NOTE — Telephone Encounter (Signed)
This pt req appt with Dr Tomi Likens , thanks

## 2014-07-09 NOTE — Telephone Encounter (Signed)
Pt aware [facility] will contact her to schedule appt

## 2014-08-04 ENCOUNTER — Encounter: Payer: Self-pay | Admitting: Family Medicine

## 2014-08-05 ENCOUNTER — Telehealth: Payer: Self-pay | Admitting: Family Medicine

## 2014-08-05 DIAGNOSIS — T50905A Adverse effect of unspecified drugs, medicaments and biological substances, initial encounter: Secondary | ICD-10-CM | POA: Insufficient documentation

## 2014-08-05 DIAGNOSIS — Z0000 Encounter for general adult medical examination without abnormal findings: Secondary | ICD-10-CM

## 2014-08-05 DIAGNOSIS — T50905S Adverse effect of unspecified drugs, medicaments and biological substances, sequela: Secondary | ICD-10-CM

## 2014-08-05 NOTE — Telephone Encounter (Signed)
Future labs for march 2016

## 2014-08-17 ENCOUNTER — Other Ambulatory Visit (INDEPENDENT_AMBULATORY_CARE_PROVIDER_SITE_OTHER): Payer: 59

## 2014-08-17 DIAGNOSIS — Z0000 Encounter for general adult medical examination without abnormal findings: Secondary | ICD-10-CM

## 2014-08-17 DIAGNOSIS — T50905S Adverse effect of unspecified drugs, medicaments and biological substances, sequela: Secondary | ICD-10-CM

## 2014-08-17 DIAGNOSIS — T887XXS Unspecified adverse effect of drug or medicament, sequela: Secondary | ICD-10-CM

## 2014-08-17 LAB — COMPREHENSIVE METABOLIC PANEL
ALBUMIN: 4.2 g/dL (ref 3.5–5.2)
ALT: 12 U/L (ref 0–35)
AST: 16 U/L (ref 0–37)
Alkaline Phosphatase: 49 U/L (ref 39–117)
BUN: 9 mg/dL (ref 6–23)
CALCIUM: 9.5 mg/dL (ref 8.4–10.5)
CO2: 29 meq/L (ref 19–32)
Chloride: 105 mEq/L (ref 96–112)
Creatinine, Ser: 0.85 mg/dL (ref 0.40–1.20)
GFR: 80.74 mL/min (ref >60.00)
GLUCOSE: 102 mg/dL — AB (ref 70–99)
Potassium: 4.6 mEq/L (ref 3.5–5.1)
SODIUM: 139 meq/L (ref 135–145)
TOTAL PROTEIN: 7.2 g/dL (ref 6.0–8.3)
Total Bilirubin: 0.5 mg/dL (ref 0.2–1.2)

## 2014-08-17 LAB — CBC WITH DIFFERENTIAL/PLATELET
BASOS PCT: 0.7 % (ref 0.0–3.0)
Basophils Absolute: 0 10*3/uL (ref 0.0–0.1)
EOS ABS: 0.1 10*3/uL (ref 0.0–0.7)
Eosinophils Relative: 1 % (ref 0.0–5.0)
HCT: 39.6 % (ref 36.0–46.0)
HEMOGLOBIN: 13.7 g/dL (ref 12.0–15.0)
LYMPHS ABS: 2.1 10*3/uL (ref 0.7–4.0)
LYMPHS PCT: 33.7 % (ref 12.0–46.0)
MCHC: 34.6 g/dL (ref 30.0–36.0)
MCV: 89.3 fl (ref 78.0–100.0)
Monocytes Absolute: 0.4 10*3/uL (ref 0.1–1.0)
Monocytes Relative: 5.8 % (ref 3.0–12.0)
NEUTROS ABS: 3.7 10*3/uL (ref 1.4–7.7)
Neutrophils Relative %: 58.8 % (ref 43.0–77.0)
Platelets: 282 10*3/uL (ref 150.0–400.0)
RBC: 4.43 Mil/uL (ref 3.87–5.11)
RDW: 12.7 % (ref 11.5–15.5)
WBC: 6.2 10*3/uL (ref 4.0–10.5)

## 2014-08-17 LAB — LIPID PANEL
Cholesterol: 157 mg/dL (ref 0–200)
HDL: 62.1 mg/dL (ref >39.00)
LDL Cholesterol: 80 mg/dL (ref 0–99)
NonHDL: 94.9
TRIGLYCERIDES: 77 mg/dL (ref 0.0–149.0)
Total CHOL/HDL Ratio: 3
VLDL: 15.4 mg/dL (ref 0.0–40.0)

## 2014-08-17 LAB — HEMOGLOBIN A1C: Hgb A1c MFr Bld: 5.6 % (ref 4.6–6.5)

## 2014-08-17 LAB — TSH: TSH: 3.42 u[IU]/mL (ref 0.35–4.50)

## 2014-08-18 ENCOUNTER — Other Ambulatory Visit: Payer: 59

## 2014-08-25 ENCOUNTER — Ambulatory Visit (INDEPENDENT_AMBULATORY_CARE_PROVIDER_SITE_OTHER): Payer: 59 | Admitting: Family Medicine

## 2014-08-25 ENCOUNTER — Other Ambulatory Visit: Payer: 59

## 2014-08-25 ENCOUNTER — Encounter: Payer: Self-pay | Admitting: Family Medicine

## 2014-08-25 VITALS — BP 120/84 | HR 102 | Temp 98.4°F | Ht 66.5 in | Wt 160.1 lb

## 2014-08-25 DIAGNOSIS — Z0000 Encounter for general adult medical examination without abnormal findings: Secondary | ICD-10-CM

## 2014-08-25 DIAGNOSIS — Z23 Encounter for immunization: Secondary | ICD-10-CM

## 2014-08-25 DIAGNOSIS — T50905S Adverse effect of unspecified drugs, medicaments and biological substances, sequela: Secondary | ICD-10-CM

## 2014-08-25 DIAGNOSIS — T887XXS Unspecified adverse effect of drug or medicament, sequela: Secondary | ICD-10-CM | POA: Diagnosis not present

## 2014-08-25 NOTE — Progress Notes (Signed)
Pre visit review using our clinic review tool, if applicable. No additional management support is needed unless otherwise documented below in the visit note. 

## 2014-08-25 NOTE — Progress Notes (Signed)
Subjective:    Patient ID: Kimberly Torres, female    DOB: [birth year], 34 y.o.   MRN: [id number]  HPI Here for health maintenance exam and to review chronic medical problems    Doing well overall  Working a lot  Nothing new going on   Trying to take care of herself   In another cycle of cluster headaches This one started in Jan  Back on her medicines - and the frequency is decreasing  (typically last about 30 min)  Also using supplemental 02 - that helps a lot   Not able to exercise a lot  Not eating very well / craves carbs with her headaches   Wt is up 2 lb with bmi of 25  She would like to weigh less - plans to work on it when she gets out of this headache cycle   Td 5/06  Wants to get that today   Flu shot in the fall   Pap - gyn visit 12/15 -and no change in pap  Still on loestrin  Periods are controlled - on continuous OC  No lumps on self exam   Back is doing well - had laminectomy in 2015   Does not need any STD screening   Results for orders placed or performed in visit on 08/17/14  CBC with Differential/Platelet  Result Value Ref Range   WBC 6.2 4.0 - 10.5 K/uL   RBC 4.43 3.87 - 5.11 Mil/uL   Hemoglobin 13.7 12.0 - 15.0 g/dL   HCT 39.6 36.0 - 46.0 %   MCV 89.3 78.0 - 100.0 fl   MCHC 34.6 30.0 - 36.0 g/dL   RDW 12.7 11.5 - 15.5 %   Platelets 282.0 150.0 - 400.0 K/uL   Neutrophils Relative % 58.8 43.0 - 77.0 %   Lymphocytes Relative 33.7 12.0 - 46.0 %   Monocytes Relative 5.8 3.0 - 12.0 %   Eosinophils Relative 1.0 0.0 - 5.0 %   Basophils Relative 0.7 0.0 - 3.0 %   Neutro Abs 3.7 1.4 - 7.7 K/uL   Lymphs Abs 2.1 0.7 - 4.0 K/uL   Monocytes Absolute 0.4 0.1 - 1.0 K/uL   Eosinophils Absolute 0.1 0.0 - 0.7 K/uL   Basophils Absolute 0.0 0.0 - 0.1 K/uL  Comprehensive metabolic panel  Result Value Ref Range   Sodium 139 135 - 145 mEq/L   Potassium 4.6 3.5 - 5.1 mEq/L   Chloride 105 96 - 112 mEq/L   CO2 29 19 - 32 mEq/L   Glucose, Bld 102  (H) 70 - 99 mg/dL   BUN 9 6 - 23 mg/dL   Creatinine, Ser 0.85 0.40 - 1.20 mg/dL   Total Bilirubin 0.5 0.2 - 1.2 mg/dL   Alkaline Phosphatase 49 39 - 117 U/L   AST 16 0 - 37 U/L   ALT 12 0 - 35 U/L   Total Protein 7.2 6.0 - 8.3 g/dL   Albumin 4.2 3.5 - 5.2 g/dL   Calcium 9.5 8.4 - 10.5 mg/dL   GFR 80.74 >60.00 mL/min  Hemoglobin A1c  Result Value Ref Range   Hgb A1c MFr Bld 5.6 4.6 - 6.5 %  Lipid panel  Result Value Ref Range   Cholesterol 157 0 - 200 mg/dL   Triglycerides 77.0 0.0 - 149.0 mg/dL   HDL 62.10 >39.00 mg/dL   VLDL 15.4 0.0 - 40.0 mg/dL   LDL Cholesterol 80 0 - 99 mg/dL   Total CHOL/HDL Ratio 3  NonHDL 94.90   TSH  Result Value Ref Range   TSH 3.42 0.35 - 4.50 uIU/mL    Good labs overall - incl cholesterol    Great cholesterol -pt states   Patient Active Problem List   Diagnosis Date Noted  . Adverse drug effect 08/05/2014  . 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 HNP (herniated nucleus pulposus), lumbar 09/28/2013  . 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 Low back pain 08/14/2013  . Neck muscle strain 05/22/2013  . Cluster headache 02/15/2012  . Routine general medical examination at a health care facility 01/28/2012  . Screen for STD (sexually transmitted disease) 01/28/2012  . Sinus tachycardia 09/11/2010  . PALPITATIONS 08/11/2010  . ANXIETY 04/15/2009  . HEMANGIOMA, HEPATIC 03/14/2009  . DEPRESSION 06/25/2007  . ADHD 06/25/2007  . SCOLIOSIS 06/25/2007   Past Medical History  Diagnosis Date  . Depression   . Anxiety   . Chondromalacia of right knee     right  . Sinus tachycardia     eval by Dr. Stanford Breed in 2007: monior revealed sinus tach. vs EAT; Echo 10/07: EF 70%  . Varicose veins     eval. by vascular surgeon and procedure scheduled to correct  . Insomnia   . Acne     sees derm- on accutane  . Dysrhythmia     hx SVT  . H/O seasonal allergies   . Headache(784.0)     cluster migraines   Past Surgical  History  Procedure Laterality Date  . Myringotomy    . Cesarean section  2009  . Knee surgery  04/10/2010  . Diagnostic laparoscopy  2010    scar tissue  . Lumbar laminectomy/decompression microdiscectomy Right 09/28/2013    Procedure: LUMBAR LAMINECTOMY/DECOMPRESSION MICRODISCECTOMY 1 LEVEL,LUMBAR  THREE-FOUR RIGHT ;  Surgeon: Charlie Pitter, MD;  Location: [facility];  Service: Neurosurgery;  Laterality: Right;  right   History  Substance Use Topics  . Smoking status: Never Smoker   . Smokeless tobacco: Never Used  . Alcohol Use: Yes     Comment: occasional to rare   Family History  Problem Relation Age of Onset  . Coronary artery disease      grandfather  . Heart attack      grandfather  . Breast cancer      grandmother  . Bipolar disorder      grandmother  . Hypertension Mother    Allergies  Allergen Reactions  . Escitalopram Oxalate     REACTION: did not help  . Venlafaxine     REACTION: hypomania   Current Outpatient Prescriptions on File Prior to Visit  Medication Sig Dispense Refill  . amphetamine-dextroamphetamine (ADDERALL XR) 30 MG 24 hr capsule Take 30 mg by mouth daily.    . calcium carbonate (OS-CAL) 600 MG TABS Take 600 mg by mouth daily with breakfast.     . chlorpheniramine (CHLOR-TRIMETON) 4 MG tablet Take 4 mg by mouth daily as needed for allergies.    . clonazePAM (KLONOPIN) 1 MG tablet Take 0.5 mg by mouth at bedtime.     . cyclobenzaprine (FLEXERIL) 10 MG tablet Take 1 tablet (10 mg total) by mouth 3 (three) times daily as needed for muscle spasms. 30 tablet 0  . fluticasone (FLONASE) 50 MCG/ACT nasal spray Place 2 sprays into both nostrils daily as needed for allergies.     . folic acid (FOLVITE) 1 MG tablet Take 1 mg by mouth daily.    Marland Kitchen ibuprofen (ADVIL,MOTRIN) 600 MG  tablet Take 600 mg by mouth 2 (two) times daily as needed.    . lamoTRIgine (LAMICTAL) 150 MG tablet Take 150 mg by mouth daily.      . Norethindrone-Ethinyl Estradiol-Fe Biphas (LO  LOESTRIN FE) 1 MG-10 MCG / 10 MCG tablet Take 1 tablet by mouth daily.    . polyethylene glycol (MIRALAX / GLYCOLAX) packet Take 17 g by mouth daily as needed for mild constipation.     No current facility-administered medications on file prior to visit.     Review of Systems Review of Systems  Constitutional: Negative for fever, appetite change, fatigue and unexpected weight change.  Eyes: Negative for pain and visual disturbance.  Respiratory: Negative for cough and shortness of breath.   Cardiovascular: Negative for cp or palpitations    Gastrointestinal: Negative for nausea, diarrhea and constipation.  Genitourinary: Negative for urgency and frequency.  Skin: Negative for pallor or rash   Neurological: Negative for weakness, light-headedness, numbness and pos for  headaches.  Hematological: Negative for adenopathy. Does not bruise/bleed easily.  Psychiatric/Behavioral: Negative for dysphoric mood. The patient is not nervous/anxious.         Objective:   Physical Exam  Constitutional: She appears well-developed and well-nourished. No distress.  HENT:  Head: Normocephalic and atraumatic.  Right Ear: External ear normal.  Left Ear: External ear normal.  Nose: Nose normal.  Mouth/Throat: Oropharynx is clear and moist.  Eyes: Conjunctivae and EOM are normal. Pupils are equal, round, and reactive to light. Right eye exhibits no discharge. Left eye exhibits no discharge. No scleral icterus.  Neck: Normal range of motion. Neck supple. No JVD present. Carotid bruit is not present. No thyromegaly present.  Cardiovascular: Normal rate, regular rhythm, normal heart sounds and intact distal pulses.  Exam reveals no gallop.   Pulmonary/Chest: Effort normal and breath sounds normal. No respiratory distress. She has no wheezes. She has no rales.  Abdominal: Soft. Bowel sounds are normal. She exhibits no distension and no mass. There is no tenderness.  Musculoskeletal: Normal range of motion.  She exhibits no edema or tenderness.  Lymphadenopathy:    She has no cervical adenopathy.  Neurological: She is alert. She has normal reflexes. No cranial nerve deficit. She exhibits normal muscle tone. Coordination normal.  Skin: Skin is warm and dry. No rash noted. No erythema. No pallor.  Stable appearing brown nevi- scattered Few lentigos  Psychiatric: She has a normal mood and affect.          Assessment & Plan:   Problem List Items Addressed This Visit      Other   Adverse drug effect    On seroquel chronically for insomnia and cluster headache  Will continue to watch A1C yearly Lab Results  Component Value Date   HGBA1C 5.6 08/17/2014    Doing well  Disc watching diet for sugar/carbs  Exercise when able       Routine general medical examination at a health care facility - Primary    Reviewed health habits including diet and exercise and skin cancer prevention Reviewed appropriate screening tests for age  Also reviewed health mt list, fam hx and immunization status , as well as social and family history    See HPI Labs reviewed -excellent cholesterol  Is able to improve health habits as headaches get better  Tdap vaccine today

## 2014-08-25 NOTE — Patient Instructions (Signed)
Try to take care of yourself  Try to switch out some carbs for more lean protein and green vegatables  Stay hydrated  Blood sugar is ok   Tdap today

## 2014-08-25 NOTE — Assessment & Plan Note (Signed)
Reviewed health habits including diet and exercise and skin cancer prevention Reviewed appropriate screening tests for age  Also reviewed health mt list, fam hx and immunization status , as well as social and family history    See HPI Labs reviewed -excellent cholesterol  Is able to improve health habits as headaches get better  Tdap vaccine today

## 2014-08-25 NOTE — Assessment & Plan Note (Signed)
On seroquel chronically for insomnia and cluster headache  Will continue to watch A1C yearly Lab Results  Component Value Date   HGBA1C 5.6 08/17/2014    Doing well  Disc watching diet for sugar/carbs  Exercise when able

## 2014-08-30 ENCOUNTER — Ambulatory Visit (INDEPENDENT_AMBULATORY_CARE_PROVIDER_SITE_OTHER): Payer: 59 | Admitting: Neurology

## 2014-08-30 ENCOUNTER — Encounter: Payer: Self-pay | Admitting: Neurology

## 2014-08-30 VITALS — BP 108/66 | HR 118 | Temp 99.1°F | Resp 20 | Ht 67.0 in | Wt 163.2 lb

## 2014-08-30 DIAGNOSIS — G44011 Episodic cluster headache, intractable: Secondary | ICD-10-CM | POA: Diagnosis not present

## 2014-08-30 NOTE — Patient Instructions (Addendum)
1.  Continue verapamil 120mg  four times daily 2.  Continue O2 and sumatriptan as needed 3.  Follow up in 3 months.  Call sooner with questions or concerns 4.  Will want your notes from your previous neurologist (including last EKG).

## 2014-08-30 NOTE — Progress Notes (Signed)
NEUROLOGY CONSULTATION NOTE  Kimberly Torres MRN: [id number] DOB: Mar 09, 1981  Referring provider: Dr. Glori Bickers Primary care provider: Dr. Glori Bickers  Reason for consult:  Cluster headache  HISTORY OF PRESENT ILLNESS: Kimberly Torres is a 34 year old right-handed woman with recent lumbar laminectomy, depression, anxiety, and palpitations who presents for cluster headaches.  Records reviewed.  Onset:  Approximately age 66.  Her previous cluster started in February and lasted 11 months.  This current cluster started at the end of January. Location:  Right retro-orbital region (right ear for first cluster) Quality:  stabbing Intensity:  7/10 Aura:  no Prodrome:  no Associated symptoms:  Right eye and nasal lacrimation, photophobia, sometimes nausea Duration:  30 minutes, first cluster lasted 11 months Frequency:  Initially during the day, then every night, now for the past month, once or twice a month.  Last cluster was 3 years ago.  She discontinued verapamil in 2013.  She was headache-free until end of January, when verapamil was restarted. Triggers/exacerbating factors:  none Relieving factors:  100% O2  Past abortive therapy:  Imitrex 6mg  [place] Past preventative therapy:  none  Current abortive therapy:  100% O2 Current preventative therapy: verapamil 120mg  four times daily, lamotrigine 150mg  (for mood), melatonin 10mg  (for sleep) Other medication:  Adderall, Seroquel 25mg  (for sleep)  Alcohol:  rarely Smoker:  no Diet:  good Exercise:  yes Depression/stress:  stable Sleep hygiene:  poor  PAST MEDICAL HISTORY: Past Medical History  Diagnosis Date  . Depression   . Anxiety   . Chondromalacia of right knee     right  . Sinus tachycardia     eval by Dr. Stanford Breed in 2007: monior revealed sinus tach. vs EAT; Echo 10/07: EF 70%  . Varicose veins     eval. by vascular surgeon and procedure scheduled to correct  . Insomnia   . Acne     sees derm- on accutane  .  Dysrhythmia     hx SVT  . H/O seasonal allergies   . Headache(784.0)     cluster migraines    PAST SURGICAL HISTORY: Past Surgical History  Procedure Laterality Date  . Myringotomy    . Cesarean section  2009  . Knee surgery  04/10/2010  . Diagnostic laparoscopy  2010    scar tissue  . Lumbar laminectomy/decompression microdiscectomy Right 09/28/2013    Procedure: LUMBAR LAMINECTOMY/DECOMPRESSION MICRODISCECTOMY 1 LEVEL,LUMBAR  THREE-FOUR RIGHT ;  Surgeon: Charlie Pitter, MD;  Location: [place] [facility];  Service: Neurosurgery;  Laterality: Right;  right    MEDICATIONS: Current Outpatient Prescriptions on File Prior to Visit  Medication Sig Dispense Refill  . amphetamine-dextroamphetamine (ADDERALL XR) 30 MG 24 hr capsule Take 30 mg by mouth daily.    . calcium carbonate (OS-CAL) 600 MG TABS Take 600 mg by mouth daily with breakfast.     . chlorpheniramine (CHLOR-TRIMETON) 4 MG tablet Take 4 mg by mouth daily as needed for allergies.    . clonazePAM (KLONOPIN) 1 MG tablet Take 0.5 mg by mouth at bedtime.     . cyclobenzaprine (FLEXERIL) 10 MG tablet Take 1 tablet (10 mg total) by mouth 3 (three) times daily as needed for muscle spasms. 30 tablet 0  . fluticasone (FLONASE) 50 MCG/ACT nasal spray Place 2 sprays into both nostrils daily as needed for allergies.     . folic acid (FOLVITE) 1 MG tablet Take 1 mg by mouth daily.    Marland Kitchen ibuprofen (ADVIL,MOTRIN) 600 MG tablet Take 600 mg  by mouth 2 (two) times daily as needed.    . lamoTRIgine (LAMICTAL) 150 MG tablet Take 150 mg by mouth daily.      . Norethindrone-Ethinyl Estradiol-Fe Biphas (LO LOESTRIN FE) 1 MG-10 MCG / 10 MCG tablet Take 1 tablet by mouth daily.    . ondansetron (ZOFRAN) 8 MG tablet May take one tab every 4-6 hours as needed for nausea    . polyethylene glycol (MIRALAX / GLYCOLAX) packet Take 17 g by mouth daily as needed for mild constipation.    . Prenat w/o A-[id number]-DHA (CITRANATAL DHA PO) Take by mouth daily.    .  QUEtiapine (SEROQUEL) 25 MG tablet Take 25 mg by mouth at bedtime.    . SUMAtriptan (IMITREX) 100 MG tablet Take one tablet (100mg ) PO as needed at onset of migraine. May repeat once in 2 hours. Max 2 pills per 24 hours.    . verapamil (CALAN-SR) 120 MG CR tablet Take 120 mg by mouth 4 (four) times daily.     No current facility-administered medications on file prior to visit.    ALLERGIES: Allergies  Allergen Reactions  . Escitalopram Oxalate     REACTION: did not help  . Venlafaxine     REACTION: hypomania    FAMILY HISTORY: Family History  Problem Relation Age of Onset  . Coronary artery disease      grandfather  . Heart attack      grandfather  . Breast cancer      grandmother  . Bipolar disorder      grandmother  . Hypertension Mother   . ADD / ADHD Mother   . ADD / ADHD Sister   . COPD Maternal Grandfather     SOCIAL HISTORY: History   Social History  . Marital Status: Married    Spouse Name: N/A  . Number of Children: N/A  . Years of Education: N/A   Occupational History  . pharmacist [employer]   Social History Main Topics  . Smoking status: Never Smoker   . Smokeless tobacco: Never Used  . Alcohol Use: 0.0 oz/week    0 Standard drinks or equivalent per week     Comment: occasional to rare  . Drug Use: No  . Sexual Activity:    Partners: Male    Birth Control/ Protection: None   Other Topics Concern  . Not on file   Social History Narrative   Family History:   GF CAD with MI in his 67s   MGM breast cancer in her 28s    GM with bipolar      Social History:   Marital Status: Married   Children: 1   Occupation: Software engineer   Her husband is a Engineer, water          REVIEW OF SYSTEMS: Constitutional: No fevers, chills, or sweats, no generalized fatigue, change in appetite Eyes: No visual changes, double vision, eye pain Ear, nose and throat: No hearing loss, ear pain, nasal congestion, sore throat Cardiovascular: No chest  pain, palpitations Respiratory:  No shortness of breath at rest or with exertion, wheezes GastrointestinaI: No nausea, vomiting, diarrhea, abdominal pain, fecal incontinence Genitourinary:  No dysuria, urinary retention or frequency Musculoskeletal:  No neck pain, back pain Integumentary: No rash, pruritus, skin lesions Neurological: as above Psychiatric: No depression, insomnia, anxiety Endocrine: No palpitations, fatigue, diaphoresis, mood swings, change in appetite, change in weight, increased thirst Hematologic/Lymphatic:  No anemia, purpura, petechiae. Allergic/Immunologic: no itchy/runny eyes, nasal congestion, recent allergic  reactions, rashes  PHYSICAL EXAM: Filed Vitals:   08/30/14 1441  BP: 108/66  Pulse: 118  Temp: 99.1 F (37.3 C)  Resp: 20   General: No acute distress Head:  Normocephalic/atraumatic Eyes:  fundi unremarkable, without vessel changes, exudates, hemorrhages or papilledema. Neck: supple, no paraspinal tenderness, full range of motion Back: No paraspinal tenderness Heart: regular rate and rhythm Lungs: Clear to auscultation bilaterally. Vascular: No carotid bruits. Neurological Exam: Mental status: alert and oriented to person, place, and time, recent and remote memory intact, fund of knowledge intact, attention and concentration intact, speech fluent and not dysarthric, language intact. Cranial nerves: CN I: not tested CN II: pupils equal, round and reactive to light, visual fields intact, fundi unremarkable, without vessel changes, exudates, hemorrhages or papilledema. CN III, IV, VI:  full range of motion, no nystagmus, no ptosis CN V: facial sensation intact CN VII: upper and lower face symmetric CN VIII: hearing intact CN IX, X: gag intact, uvula midline CN XI: sternocleidomastoid and trapezius muscles intact CN XII: tongue midline Bulk & Tone: normal, no fasciculations. Motor:  5/5 throughout Sensation:  Temperature and vibration intact Deep  Tendon Reflexes:  2+ throughout, toes downgoing Finger to nose testing:  No dysmetria Heel to shin:  No dysmetria Gait:  Normal station and stride.  Able to turn and walk in tandem. Romberg negative.  IMPRESSION: Episodic cluster headaches  PLAN: 1.  Continue verapamil 120mg  four times daily 2.  Other medications used for other conditions which may be effective for cluster:  Lamotrigine 150mg , melatonin 10mg  3.  100% O2 for abortive therapy.  Imitrex 6mg  [place] or 100mg  PO if needed 4.  Get notes from previous neurologist including last EKG (performed 3 weeks ago) 5.  Follow up in 3 months.  Thank you for allowing me to take part in the care of this patient.  Metta Clines, DO  CC:  Loura Pardon, MD

## 2014-09-01 ENCOUNTER — Encounter: Payer: 59 | Admitting: Family Medicine

## 2014-12-08 ENCOUNTER — Ambulatory Visit: Payer: 59 | Admitting: Neurology

## 2015-01-08 IMAGING — US US EXTREM LOW VENOUS*R*
1 series · 13 of 24 positions shown · non-contrast
Comparison: None.

CLINICAL DATA: Right lower extremity edema. History of varicose
veins with prior laser occlusion therapy.



[Series 1: us extrem low venous*right* · 13 of 35 slices shown]
[im 1/35]
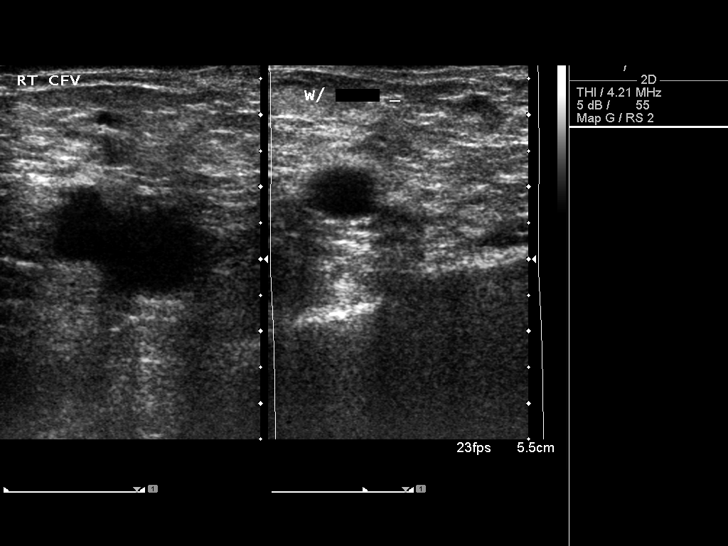
[im 3/35]
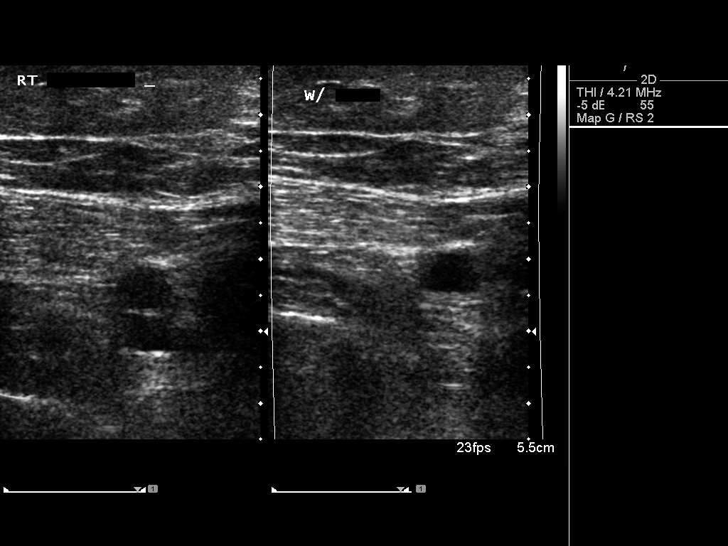
[im 6/35]
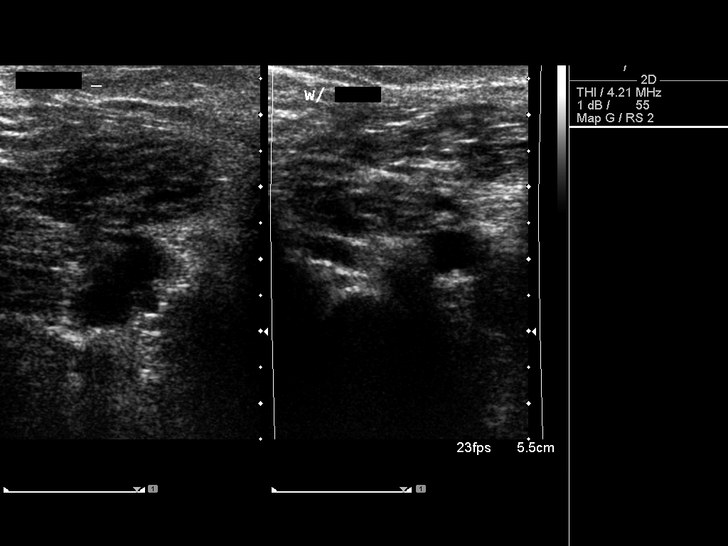
[im 9/35]
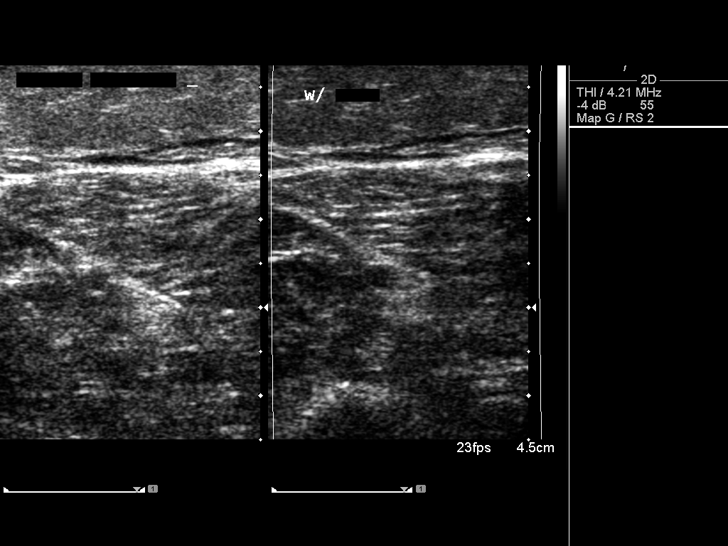
[im 12/35]
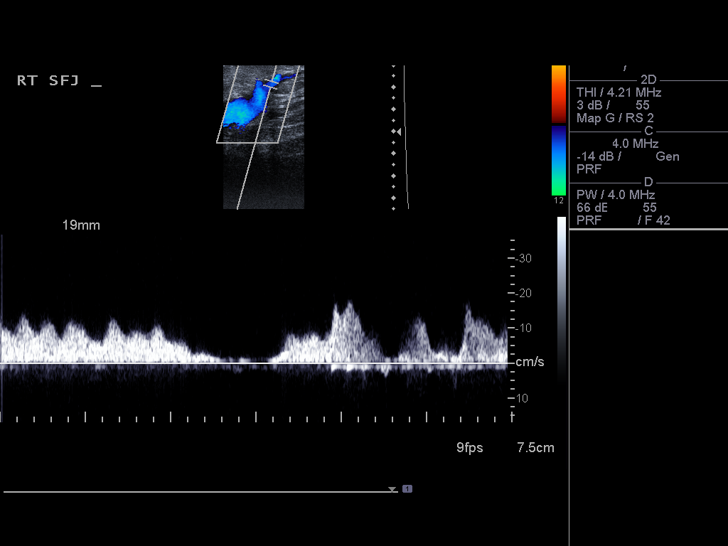
[im 15/35]
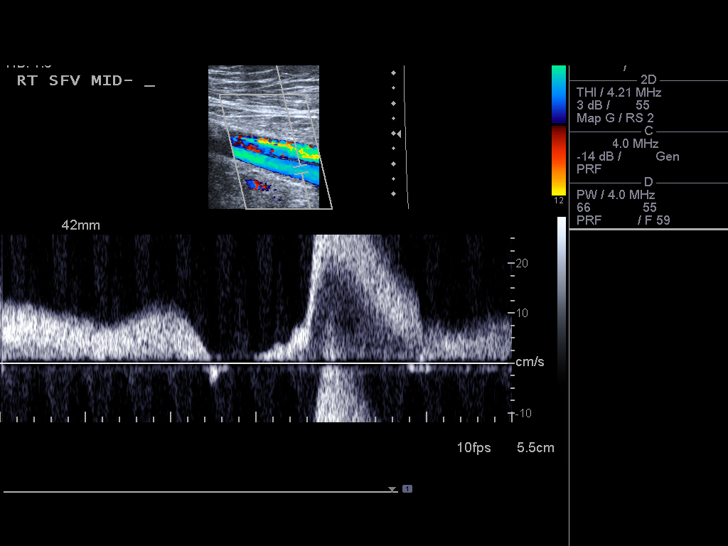
[im 18/35]
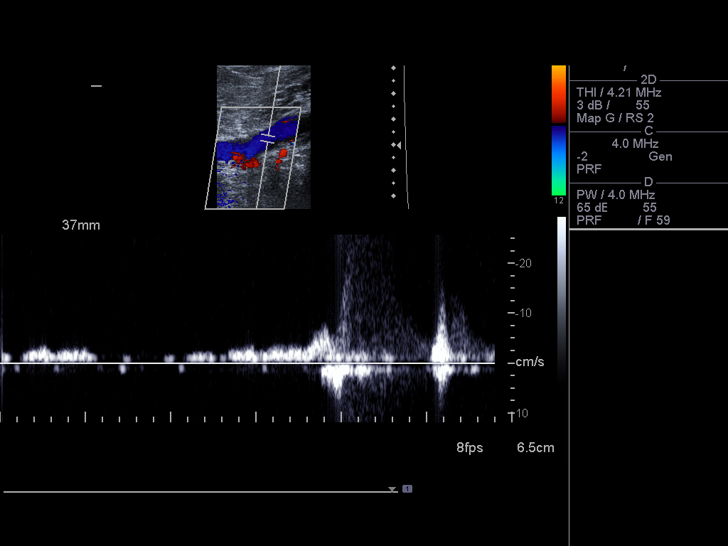
[im 20/35]
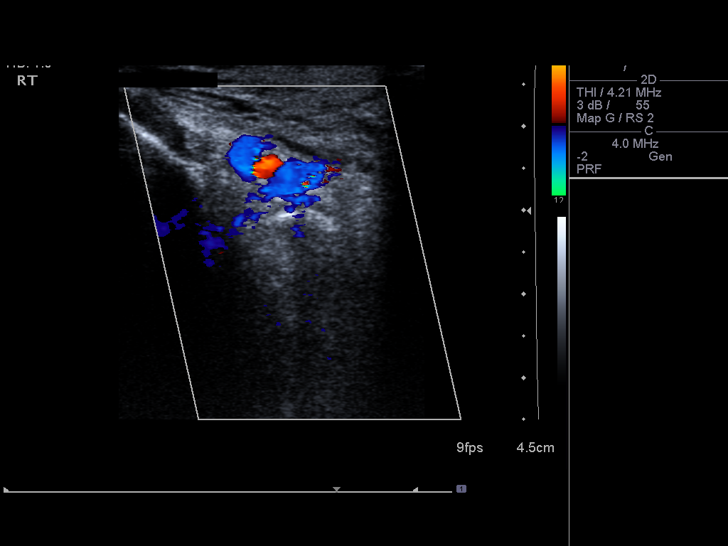
[im 23/35]
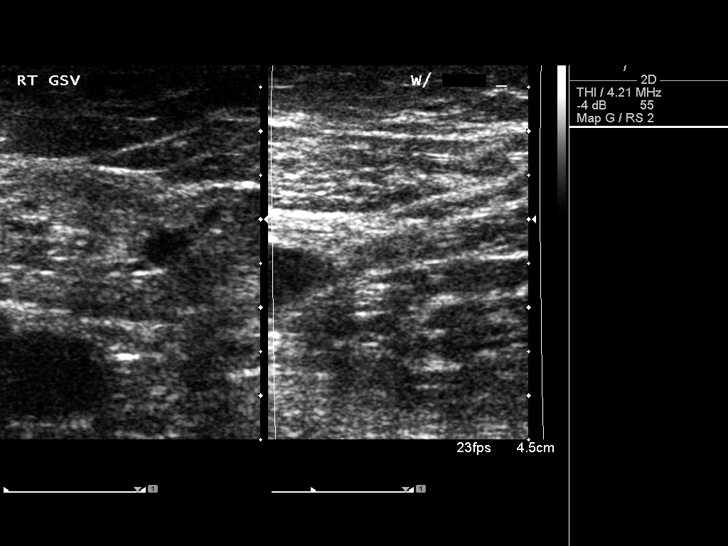
[im 26/35]
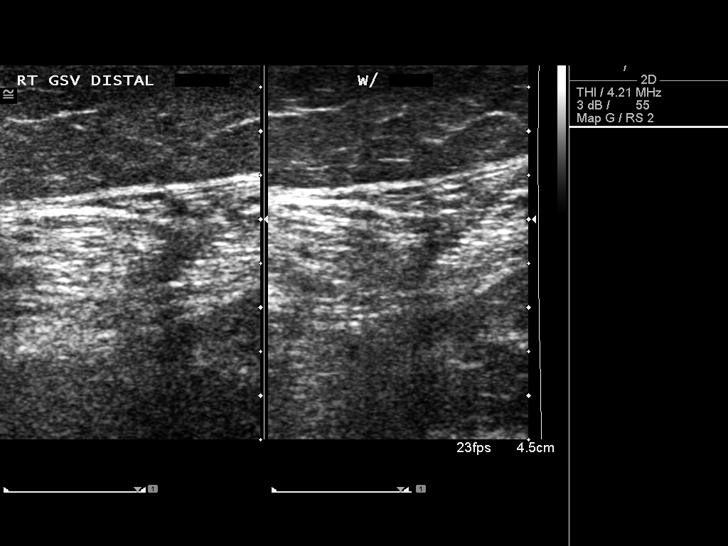
[im 29/35]
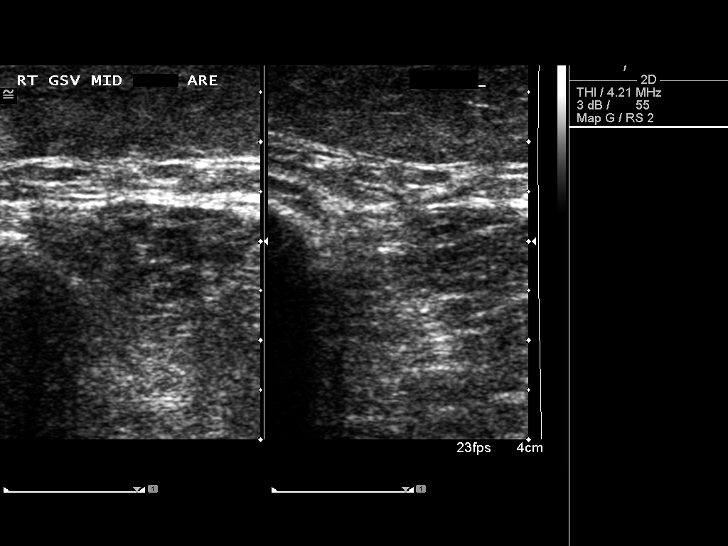
[im 32/35]
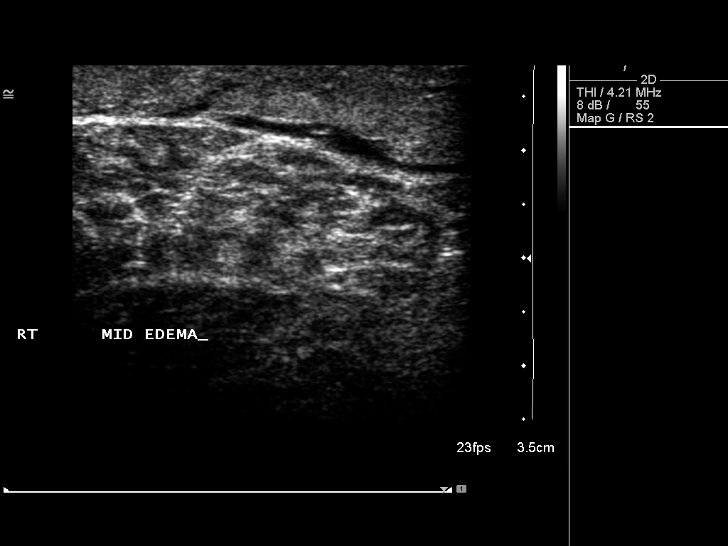
[im 35/35]
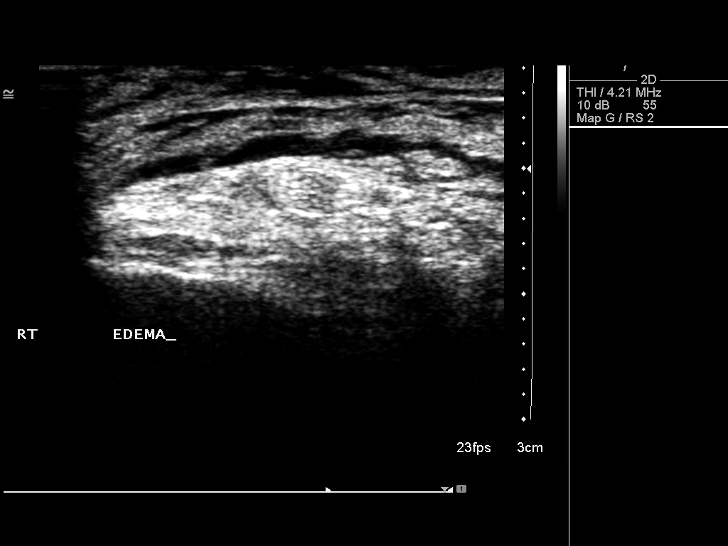

[13 of 24 positions shown; findings below may reference images not displayed]

FINDINGS: Common Femoral Vein: No evidence of thrombus. Normal
compressibility, respiratory phasicity and response to augmentation.

Saphenofemoral Junction: No evidence of thrombus. Normal
compressibility and flow on color Doppler imaging.

Profunda Femoral Vein: No evidence of thrombus. Normal
compressibility and flow on color Doppler imaging.

Femoral Vein: No evidence of thrombus. Normal compressibility,
respiratory phasicity and response to augmentation.

Popliteal Vein: No evidence of thrombus. Normal compressibility,
respiratory phasicity and response to augmentation.

Calf Veins: No evidence of thrombus. Normal compressibility and flow
on color Doppler imaging.

Superficial Great Saphenous Vein: The GSV is largely atretic and
occluded, consistent with prior treatment. Short segments of the GSV
are patent and small in caliber in the calf without evidence of
thrombophlebitis. There are no visualized varicosities.

Venous Reflux:  None.

Other Findings:  None.
IMPRESSION: No evidence of right lower extremity deep venous thrombosis. Atretic
and largely occluded GSV is consistent with prior treatment.

## 2015-02-01 ENCOUNTER — Ambulatory Visit: Payer: 59 | Admitting: Neurology

## 2015-03-10 ENCOUNTER — Telehealth: Payer: Self-pay | Admitting: Cardiovascular Disease

## 2015-03-10 NOTE — Telephone Encounter (Signed)
Patient does not want to schedule a fu at this time.  Patient said she does not need to be seen .  Is not having the issue she was having.  Patient  Will call if needed for care in future.  Removing patient from recall list.

## 2015-04-05 ENCOUNTER — Ambulatory Visit: Payer: 59 | Admitting: Neurology

## 2015-04-06 ENCOUNTER — Encounter: Payer: Self-pay | Admitting: Family Medicine

## 2015-04-08 ENCOUNTER — Encounter: Payer: Self-pay | Admitting: Physician Assistant

## 2015-04-08 ENCOUNTER — Ambulatory Visit: Payer: Self-pay | Admitting: Physician Assistant

## 2015-04-08 VITALS — BP 120/89 | HR 127 | Temp 98.8°F

## 2015-04-08 DIAGNOSIS — J069 Acute upper respiratory infection, unspecified: Secondary | ICD-10-CM

## 2015-04-08 MED ORDER — METHYLPREDNISOLONE 4 MG PO TBPK: ORAL_TABLET | ORAL | Status: DC

## 2015-04-08 MED ORDER — FLUCONAZOLE 150 MG PO TABS: 150.0000 mg | ORAL_TABLET | Freq: Every day | ORAL | Status: DC

## 2015-04-08 MED ORDER — AMOXICILLIN 875 MG PO TABS: 875.0000 mg | ORAL_TABLET | Freq: Two times a day (BID) | ORAL | Status: DC

## 2015-04-08 NOTE — Progress Notes (Signed)
S: C/o runny nose and congestion for 3 days, no fever, chills, cp/sob, v/d; states voice is hoarse, feels a little better today than yesterday Using otc meds:   O: PE: vitals w elevated pulse at 127;  perrl eomi, normocephalic, tms dull, nasal mucosa pink and swollen, throat injected, voice is hoarse, neck supple no lymph, lungs c t a, cv rrr, neuro intact  A:  Acute uri, laryngitis   P: amoxil 875mg  bid x 10d, diflucan, medrol dose pack; drink fluids, continue regular meds , use otc meds of choice, return if not improving in 5 days, return earlier if worsening

## 2015-06-07 DIAGNOSIS — F411 Generalized anxiety disorder: Secondary | ICD-10-CM | POA: Diagnosis not present

## 2015-06-07 DIAGNOSIS — F902 Attention-deficit hyperactivity disorder, combined type: Secondary | ICD-10-CM | POA: Diagnosis not present

## 2015-06-07 MED FILL — hydrOXYzine HCL 50 MG TABS: 50 | 30 days supply | Qty: 60 | Fill #0

## 2015-06-10 MED FILL — BUPROPION SR 150 MG TABLET: 150 | 30 days supply | Qty: 60 | Fill #0

## 2015-06-10 MED FILL — DEXTROAMP-AMPHET ER 15 MG C: 15 | 90 days supply | Qty: 270 | Fill #0

## 2015-06-22 ENCOUNTER — Encounter: Payer: Self-pay | Admitting: Family Medicine

## 2015-06-22 ENCOUNTER — Ambulatory Visit: Payer: Self-pay | Admitting: Physician Assistant

## 2015-06-22 ENCOUNTER — Ambulatory Visit (INDEPENDENT_AMBULATORY_CARE_PROVIDER_SITE_OTHER): Payer: 59 | Admitting: Family Medicine

## 2015-06-22 VITALS — BP 128/86 | HR 113 | Temp 98.7°F | Ht 67.0 in | Wt 188.0 lb

## 2015-06-22 DIAGNOSIS — R05 Cough: Secondary | ICD-10-CM

## 2015-06-22 DIAGNOSIS — J069 Acute upper respiratory infection, unspecified: Secondary | ICD-10-CM

## 2015-06-22 DIAGNOSIS — R059 Cough, unspecified: Secondary | ICD-10-CM | POA: Insufficient documentation

## 2015-06-22 LAB — POCT INFLUENZA A/B
Influenza A, POC: NEGATIVE
Influenza B, POC: NEGATIVE

## 2015-06-22 MED ORDER — PREDNISONE 50 MG PO TABS: ORAL_TABLET | ORAL | Status: DC

## 2015-06-22 MED ORDER — HYDROCOD POLST-CPM POLST ER 10-8 MG/5ML PO SUER: 5.0000 mL | Freq: Two times a day (BID) | ORAL | Status: DC | PRN

## 2015-06-22 NOTE — Assessment & Plan Note (Signed)
New problem. Lung exam unremarkable. Discussed merits of chest x-ray and patient would like to hold off at this time. Treating with Tussionex and prednisone. Follow-up if she fails to improve or worsens.

## 2015-06-22 NOTE — Progress Notes (Signed)
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

## 2015-06-22 NOTE — Progress Notes (Signed)
Subjective:  Patient ID: Kimberly Torres, female    DOB: 02-28-1981  Age: 35 y.o. MRN: [id number]  CC: Cough, fever  HPI:  35 year old female presents to clinic today for an acute visit with complaints of cough and fever.  Patient states that she developed a cough yesterday. She reports associated "lung" burning, back pain and fever (Tmax 101.9). Additionally, she reports severe fatigue. She states her cough is mildly productive. No exacerbating or relieving factors. No reports of shortness of breath. She has had known sick contacts.  Social Hx   Social History   Social History  . Marital Status: Married    Spouse Name: N/A  . Number of Children: N/A  . Years of Education: N/A   Occupational History  . pharmacist [employer]   Social History Main Topics  . Smoking status: Never Smoker   . Smokeless tobacco: Never Used  . Alcohol Use: 0.0 oz/week    0 Standard drinks or equivalent per week     Comment: occasional to rare  . Drug Use: No  . Sexual Activity:    Partners: Male    Birth Control/ Protection: None   Other Topics Concern  . None   Social History Narrative   Family History:   GF CAD with MI in his 65s   MGM breast cancer in her 23s    GM with bipolar      Social History:   Marital Status: Married   Children: 1   Occupation: Software engineer   Her husband is a Engineer, water         Review of Systems  Constitutional: Positive for fever.  Respiratory: Positive for cough.   Musculoskeletal: Positive for back pain.   Objective:  BP 128/86 mmHg  Pulse 113  Temp(Src) 98.7 F (37.1 C) (Oral)  Ht 5\' 7"  (1.702 m)  Wt 188 lb (85.276 kg)  BMI 29.44 kg/m2  SpO2 99%  BP/Weight 06/22/2015 04/08/2015 123XX123  Systolic BP 0000000 [id number] [id number]  Diastolic BP 86 89 66  Wt. (Lbs) 188 - 163.2  BMI 29.44 - 25.55   Physical Exam  Constitutional: She appears well-developed. No distress.  HENT:  Head: Normocephalic and atraumatic.  Mouth/Throat:  Oropharynx is clear and moist. No oropharyngeal exudate.  Left TM with scarring. No erythema or effusion. Right TM with bulging/bullous appearance. No erythema or effusion. Of note, old tympanostomy tube was noted in the right ear canal and was removed today via ear curette.  Cardiovascular: Regular rhythm.  Tachycardia present.   Pulmonary/Chest: Effort normal and breath sounds normal. No respiratory distress. She has no wheezes. She has no rales.  Neurological: She is alert.  Psychiatric: She has a normal mood and affect.  Vitals reviewed.  Lab Results  Component Value Date   WBC 6.2 08/17/2014   HGB 13.7 08/17/2014   HCT 39.6 08/17/2014   PLT 282.0 08/17/2014   GLUCOSE 102* 08/17/2014   CHOL 157 08/17/2014   TRIG 77.0 08/17/2014   HDL 62.10 08/17/2014   LDLCALC 80 08/17/2014   ALT 12 08/17/2014   AST 16 08/17/2014   NA 139 08/17/2014   K 4.6 08/17/2014   CL 105 08/17/2014   CREATININE 0.85 08/17/2014   BUN 9 08/17/2014   CO2 29 08/17/2014   TSH 3.42 08/17/2014   HGBA1C 5.6 08/17/2014    Assessment & Plan:   Problem List Items Addressed This Visit    Cough - Primary    New problem. Lung exam  unremarkable. Discussed merits of chest x-ray and patient would like to hold off at this time. Treating with Tussionex and prednisone. Follow-up if she fails to improve or worsens.         Meds ordered this encounter  Medications  . chlorpheniramine-HYDROcodone (TUSSIONEX PENNKINETIC ER) 10-8 MG/5ML SUER    Sig: Take 5 mLs by mouth every 12 (twelve) hours as needed.    Dispense:  115 mL    Refill:  0  . predniSONE (DELTASONE) 50 MG tablet    Sig: 1 tablet daily x 5 days.    Dispense:  5 tablet    Refill:  0    Follow-up: PRN  [place]

## 2015-06-22 NOTE — Progress Notes (Signed)
Pre visit review using our clinic review tool, if applicable. No additional management support is needed unless otherwise documented below in the visit note. 

## 2015-06-22 NOTE — Patient Instructions (Addendum)
Let me or Dr. Glori Bickers know if you continue to have fever or you fail to improve.  Take the prednisone as prescribed.  Use the tussionex as directed for cough.  Don't combine it with the chlor-trimeton.  Take care  Dr. Lacinda Axon

## 2015-06-30 MED FILL — clonazePAM 0.5 MG TABS: 0.5 | 30 days supply | Qty: 30 | Fill #2

## 2015-07-01 ENCOUNTER — Encounter: Payer: Self-pay | Admitting: Physician Assistant

## 2015-07-01 ENCOUNTER — Ambulatory Visit: Payer: Self-pay | Admitting: Physician Assistant

## 2015-07-01 VITALS — BP 140/80 | HR 100 | Temp 98.5°F

## 2015-07-01 DIAGNOSIS — R062 Wheezing: Secondary | ICD-10-CM

## 2015-07-01 DIAGNOSIS — J069 Acute upper respiratory infection, unspecified: Secondary | ICD-10-CM

## 2015-07-01 MED ORDER — AZITHROMYCIN 250 MG PO TABS: ORAL_TABLET | ORAL | Status: DC

## 2015-07-01 MED ORDER — ALBUTEROL SULFATE HFA 108 (90 BASE) MCG/ACT IN AERS: 2.0000 | INHALATION_SPRAY | Freq: Four times a day (QID) | RESPIRATORY_TRACT | Status: DC | PRN

## 2015-07-01 MED ORDER — METHYLPREDNISOLONE 4 MG PO TBPK: ORAL_TABLET | ORAL | Status: AC

## 2015-07-01 MED ORDER — IPRATROPIUM-ALBUTEROL 0.5-2.5 (3) MG/3ML IN SOLN: 3.0000 mL | Freq: Once | RESPIRATORY_TRACT | Status: DC

## 2015-07-01 NOTE — Progress Notes (Signed)
S: C/o cough and congestion with wheezing and chest pain, chest is sore from coughing, more on left side, had  fever, chills last week; sx for 1.5 weeks, had neg flu test; cough is dry and hacking; keeping pt awake at night;  denies cardiac type chest pain or sob, v/d, abd pain Remainder ros neg  O: vitals wnl, nad, tms clear, throat injected, neck supple no lymph, lungs with wheezing, cv rrr, neuro intact, svn duoneb given lungs c t a , no wheezing  A:  Acute bronchitis   P:  rx medication: zpack, medrol dose pack, albuterol inhaler ;  use otc meds, tylenol or motrin as needed for fever/chills, return if not better in 3 -5 days, return earlier if worsening

## 2015-07-04 ENCOUNTER — Ambulatory Visit
Admission: RE | Admit: 2015-07-04 | Discharge: 2015-07-04 | Disposition: A | Discharge status: Home / Self Care | Payer: 59 | Source: Ambulatory Visit | Attending: Physician Assistant | Admitting: Physician Assistant

## 2015-07-04 DIAGNOSIS — R05 Cough: Secondary | ICD-10-CM | POA: Diagnosis not present

## 2015-07-04 DIAGNOSIS — J069 Acute upper respiratory infection, unspecified: Secondary | ICD-10-CM | POA: Diagnosis not present

## 2015-07-04 NOTE — Addendum Note (Signed)
Addended by: Versie Starks on: 07/04/2015 01:11 PM   Modules accepted: Orders

## 2015-07-14 ENCOUNTER — Ambulatory Visit: Payer: 59 | Admitting: Neurology

## 2015-08-01 MED FILL — clonazePAM 0.5 MG TABS: 0.5 | 30 days supply | Qty: 30 | Fill #0

## 2015-08-01 MED FILL — BUPROPION HCL SR 150 MG TAB: 150 | 30 days supply | Qty: 60 | Fill #1

## 2015-08-21 ENCOUNTER — Telehealth: Payer: Self-pay | Admitting: Family Medicine

## 2015-08-21 DIAGNOSIS — Z0000 Encounter for general adult medical examination without abnormal findings: Secondary | ICD-10-CM

## 2015-08-21 NOTE — Telephone Encounter (Signed)
-----   Message from Marchia Bond sent at 08/15/2015  1:23 PM EDT ----- Regarding: Cpx labs Mon 3/20, need orders. Thanks! :-) Please order  future cpx labs for pt's upcoming lab appt. Thanks Aniceto Boss

## 2015-08-22 ENCOUNTER — Other Ambulatory Visit: Payer: 59

## 2015-08-29 ENCOUNTER — Encounter: Payer: 59 | Admitting: Family Medicine

## 2015-08-30 DIAGNOSIS — F902 Attention-deficit hyperactivity disorder, combined type: Secondary | ICD-10-CM | POA: Diagnosis not present

## 2015-08-30 DIAGNOSIS — F411 Generalized anxiety disorder: Secondary | ICD-10-CM | POA: Diagnosis not present

## 2015-09-05 MED FILL — hydrOXYzine HCL 50 MG TABS: 50 | 30 days supply | Qty: 60 | Fill #1

## 2015-09-05 MED FILL — clonazePAM 0.5 MG TABS: 0.5 | 30 days supply | Qty: 30 | Fill #0

## 2015-09-08 MED FILL — DEXTROAMP-AMPHET ER 15 MG C: 15 | 90 days supply | Qty: 270 | Fill #0

## 2015-10-07 MED FILL — hydrOXYzine HCL 50 MG TABS: 50 | 30 days supply | Qty: 60 | Fill #2

## 2015-10-07 MED FILL — clonazePAM 0.5 MG TABS: 0.5 | 30 days supply | Qty: 30 | Fill #1

## 2015-10-25 DIAGNOSIS — L700 Acne vulgaris: Secondary | ICD-10-CM | POA: Diagnosis not present

## 2015-11-01 MED FILL — hydrOXYzine HCL 50 MG TABS: 50 | 30 days supply | Qty: 60 | Fill #3

## 2015-11-04 MED FILL — clonazePAM 0.5 MG TABS: 0.5 | 30 days supply | Qty: 30 | Fill #2

## 2015-11-14 ENCOUNTER — Ambulatory Visit: Payer: 59 | Admitting: Neurology

## 2015-12-01 MED FILL — hydrOXYzine HCL 50 MG TABS: 50 | 30 days supply | Qty: 60 | Fill #0

## 2015-12-01 MED FILL — clonazePAM 0.5 MG TABS: 0.5 | 30 days supply | Qty: 30 | Fill #0

## 2016-01-02 MED FILL — clonazePAM 0.5 MG TABS: 0.5 | 30 days supply | Qty: 30 | Fill #3

## 2016-01-02 MED FILL — hydrOXYzine HCL 50 MG TABS: 50 | 30 days supply | Qty: 60 | Fill #1

## 2016-01-26 MED FILL — hydrOXYzine HCL 50 MG TABS: 50 | 30 days supply | Qty: 60 | Fill #2

## 2016-02-14 DIAGNOSIS — F3181 Bipolar II disorder: Secondary | ICD-10-CM | POA: Diagnosis not present

## 2016-02-14 DIAGNOSIS — F411 Generalized anxiety disorder: Secondary | ICD-10-CM | POA: Diagnosis not present

## 2016-02-14 MED FILL — clonazePAM 0.5 MG TABS: 0.5 | 30 days supply | Qty: 15 | Fill #0

## 2016-02-14 MED FILL — DEXTROAMP-AMPHET ER 30 MG C: 30 | 90 days supply | Qty: 90 | Fill #0

## 2016-02-24 MED FILL — hydrOXYzine HCL 50 MG TABS: 50 | 30 days supply | Qty: 60 | Fill #3

## 2016-03-21 MED FILL — hydrOXYzine HCL 50 MG TABS: 50 | 30 days supply | Qty: 60 | Fill #0

## 2016-03-26 DIAGNOSIS — N920 Excessive and frequent menstruation with regular cycle: Secondary | ICD-10-CM | POA: Diagnosis not present

## 2016-03-26 DIAGNOSIS — R109 Unspecified abdominal pain: Secondary | ICD-10-CM | POA: Diagnosis not present

## 2016-03-26 DIAGNOSIS — Z3200 Encounter for pregnancy test, result unknown: Secondary | ICD-10-CM | POA: Diagnosis not present

## 2016-04-05 DIAGNOSIS — R1032 Left lower quadrant pain: Secondary | ICD-10-CM | POA: Diagnosis not present

## 2016-04-05 DIAGNOSIS — N920 Excessive and frequent menstruation with regular cycle: Secondary | ICD-10-CM | POA: Diagnosis not present

## 2016-04-13 MED FILL — hydrOXYzine HCL 50 MG TABS: 50 | 30 days supply | Qty: 60 | Fill #1 | Status: TO

## 2016-04-16 DIAGNOSIS — F3181 Bipolar II disorder: Secondary | ICD-10-CM | POA: Diagnosis not present

## 2016-04-16 DIAGNOSIS — F411 Generalized anxiety disorder: Secondary | ICD-10-CM | POA: Diagnosis not present

## 2016-04-16 DIAGNOSIS — F902 Attention-deficit hyperactivity disorder, combined type: Secondary | ICD-10-CM | POA: Diagnosis not present

## 2016-04-16 MED FILL — clonazePAM 0.5 MG TABS: 0.5 | 90 days supply | Qty: 45 | Fill #0

## 2016-04-18 DIAGNOSIS — Z1151 Encounter for screening for human papillomavirus (HPV): Secondary | ICD-10-CM | POA: Diagnosis not present

## 2016-04-18 DIAGNOSIS — R87810 Cervical high risk human papillomavirus (HPV) DNA test positive: Secondary | ICD-10-CM | POA: Diagnosis not present

## 2016-04-18 DIAGNOSIS — Z01419 Encounter for gynecological examination (general) (routine) without abnormal findings: Secondary | ICD-10-CM | POA: Diagnosis not present

## 2016-04-18 DIAGNOSIS — R875 Abnormal microbiological findings in specimens from female genital organs: Secondary | ICD-10-CM | POA: Diagnosis not present

## 2016-04-18 DIAGNOSIS — Z6834 Body mass index (BMI) 34.0-34.9, adult: Secondary | ICD-10-CM | POA: Diagnosis not present

## 2016-04-18 MED FILL — FOLIC ACID 1 MG TABLET: 1 | 90 days supply | Qty: 90 | Fill #0 | Status: TO

## 2016-05-03 DIAGNOSIS — H5203 Hypermetropia, bilateral: Secondary | ICD-10-CM | POA: Diagnosis not present

## 2016-05-17 MED FILL — DEXTROAMP-AMPHET ER 30 MG C: 30 | 90 days supply | Qty: 90 | Fill #0

## 2016-07-16 DIAGNOSIS — F902 Attention-deficit hyperactivity disorder, combined type: Secondary | ICD-10-CM | POA: Diagnosis not present

## 2016-07-16 DIAGNOSIS — F39 Unspecified mood [affective] disorder: Secondary | ICD-10-CM | POA: Diagnosis not present

## 2016-07-16 DIAGNOSIS — F411 Generalized anxiety disorder: Secondary | ICD-10-CM | POA: Diagnosis not present

## 2016-08-08 ENCOUNTER — Other Ambulatory Visit: Payer: 59

## 2016-08-15 ENCOUNTER — Encounter: Payer: Self-pay | Admitting: Family Medicine

## 2016-08-23 MED FILL — AMPHETAMINE SALTS 15 MG TAB: 15 | 90 days supply | Qty: 180 | Fill #0

## 2016-09-04 ENCOUNTER — Telehealth: Payer: Self-pay | Admitting: Family Medicine

## 2016-09-04 DIAGNOSIS — Z0000 Encounter for general adult medical examination without abnormal findings: Secondary | ICD-10-CM

## 2016-09-04 DIAGNOSIS — T50905S Adverse effect of unspecified drugs, medicaments and biological substances, sequela: Secondary | ICD-10-CM

## 2016-09-04 NOTE — Telephone Encounter (Signed)
-----   Message from Ellamae Sia sent at 09/04/2016  2:54 PM EDT ----- Regarding: Lab orders for Wednesday, 4.18.18 Patient is scheduled for CPX labs, please order future labs, Thanks , Karna Christmas

## 2016-09-19 ENCOUNTER — Other Ambulatory Visit: Payer: 59

## 2016-09-28 ENCOUNTER — Encounter: Payer: 59 | Admitting: Family Medicine

## 2016-10-03 ENCOUNTER — Ambulatory Visit: Payer: Self-pay | Admitting: Physician Assistant

## 2016-10-03 ENCOUNTER — Encounter: Payer: Self-pay | Admitting: Physician Assistant

## 2016-10-03 VITALS — BP 120/86 | HR 98 | Temp 98.9°F

## 2016-10-03 DIAGNOSIS — H1031 Unspecified acute conjunctivitis, right eye: Secondary | ICD-10-CM

## 2016-10-03 MED ORDER — TOBRAMYCIN 0.3 % OP SOLN: 2.0000 [drp] | OPHTHALMIC | 1 refills | Status: DC

## 2016-10-03 NOTE — Progress Notes (Signed)
S:  C/o right eye being irritated, red and itchy, sx started last pm, coworker had pink eye last week; denies, cough, congestion, fever, chills, v/d; remainder ros neg  O: vitals wnl, nad, perrl eomi, left eye with injected conjunctiva, no drainage or matting noted at this time,  neck supple no lymph, lungs c t a, cv rrr  A:  Acute conjunctivitis  P: tobramycin opth gtts, return if not better in 3 - 5d, if worsening return earlier or see eye doctor, no work until eye is no longer red or draining

## 2016-10-08 DIAGNOSIS — F902 Attention-deficit hyperactivity disorder, combined type: Secondary | ICD-10-CM | POA: Diagnosis not present

## 2016-10-08 DIAGNOSIS — F411 Generalized anxiety disorder: Secondary | ICD-10-CM | POA: Diagnosis not present

## 2016-11-23 MED FILL — AMPHETAMINE SALTS 15 MG TAB: 15 | 90 days supply | Qty: 180 | Fill #0

## 2016-12-07 ENCOUNTER — Ambulatory Visit: Payer: Self-pay | Admitting: Family

## 2016-12-07 VITALS — BP 130/90 | HR 103 | Temp 98.3°F

## 2016-12-07 DIAGNOSIS — M5010 Cervical disc disorder with radiculopathy, unspecified cervical region: Secondary | ICD-10-CM

## 2016-12-07 MED ORDER — METHOCARBAMOL 750 MG PO TABS: 750.0000 mg | ORAL_TABLET | Freq: Three times a day (TID) | ORAL | 0 refills | Status: DC | PRN

## 2016-12-07 MED ORDER — PREDNISONE 20 MG PO TABS: ORAL_TABLET | ORAL | 0 refills | Status: DC
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
## 2017-01-07 DIAGNOSIS — F902 Attention-deficit hyperactivity disorder, combined type: Secondary | ICD-10-CM | POA: Diagnosis not present

## 2017-01-07 DIAGNOSIS — F411 Generalized anxiety disorder: Secondary | ICD-10-CM | POA: Diagnosis not present

## 2017-01-07 DIAGNOSIS — F39 Unspecified mood [affective] disorder: Secondary | ICD-10-CM | POA: Diagnosis not present

## 2017-01-24 ENCOUNTER — Ambulatory Visit: Payer: Self-pay | Admitting: Physician Assistant

## 2017-01-24 ENCOUNTER — Encounter: Payer: Self-pay | Admitting: Physician Assistant

## 2017-01-24 VITALS — BP 130/90 | HR 111 | Temp 98.3°F

## 2017-01-24 DIAGNOSIS — J0100 Acute maxillary sinusitis, unspecified: Secondary | ICD-10-CM

## 2017-01-24 MED ORDER — AZITHROMYCIN 250 MG PO TABS: ORAL_TABLET | ORAL | 0 refills | Status: DC

## 2017-01-24 NOTE — Progress Notes (Signed)
S: C/o runny nose and congestion for 1-2 days, + fever last night, denies chills, cp/sob, v/d; mucus is green and thick (only this am), cough is sporadic, c/o of facial and dental pain.   Using otc meds:   O: PE: vitals wnl, nad, perrl eomi, normocephalic, tms dull, nasal mucosa red and swollen, throat injected, neck supple no lymph, lungs c t a, cv rrr, neuro intact  A:  Acute sinusitis   P: drink fluids, continue regular meds , use otc meds of choice, return if not improving in 5 days, return earlier if worsening , zpack '

## 2017-01-25 ENCOUNTER — Ambulatory Visit: Payer: Self-pay | Admitting: Physician Assistant

## 2017-02-28 MED FILL — AMPHETAMINE SALTS 15 MG TAB: 15 | 90 days supply | Qty: 180 | Fill #0

## 2017-03-20 ENCOUNTER — Encounter: Payer: Self-pay | Admitting: Family Medicine

## 2017-04-22 DIAGNOSIS — F39 Unspecified mood [affective] disorder: Secondary | ICD-10-CM | POA: Diagnosis not present

## 2017-04-22 DIAGNOSIS — F902 Attention-deficit hyperactivity disorder, combined type: Secondary | ICD-10-CM | POA: Diagnosis not present

## 2017-04-22 DIAGNOSIS — F411 Generalized anxiety disorder: Secondary | ICD-10-CM | POA: Diagnosis not present

## 2017-05-03 ENCOUNTER — Other Ambulatory Visit (INDEPENDENT_AMBULATORY_CARE_PROVIDER_SITE_OTHER): Payer: 59

## 2017-05-03 DIAGNOSIS — T50905S Adverse effect of unspecified drugs, medicaments and biological substances, sequela: Secondary | ICD-10-CM

## 2017-05-03 DIAGNOSIS — Z0000 Encounter for general adult medical examination without abnormal findings: Secondary | ICD-10-CM | POA: Diagnosis not present

## 2017-05-03 LAB — CBC WITH DIFFERENTIAL/PLATELET
Basophils Absolute: 0 10*3/uL (ref 0.0–0.1)
Basophils Relative: 0.7 % (ref 0.0–3.0)
EOS PCT: 2 % (ref 0.0–5.0)
Eosinophils Absolute: 0.1 10*3/uL (ref 0.0–0.7)
HCT: 38.7 % (ref 36.0–46.0)
Hemoglobin: 12.7 g/dL (ref 12.0–15.0)
Lymphocytes Relative: 32.9 % (ref 12.0–46.0)
Lymphs Abs: 1.9 10*3/uL (ref 0.7–4.0)
MCHC: 32.9 g/dL (ref 30.0–36.0)
MCV: 91 fl (ref 78.0–100.0)
MONOS PCT: 7.6 % (ref 3.0–12.0)
Monocytes Absolute: 0.4 10*3/uL (ref 0.1–1.0)
Neutro Abs: 3.3 10*3/uL (ref 1.4–7.7)
Neutrophils Relative %: 56.8 % (ref 43.0–77.0)
Platelets: 266 10*3/uL (ref 150.0–400.0)
RBC: 4.25 Mil/uL (ref 3.87–5.11)
RDW: 12.5 % (ref 11.5–15.5)
WBC: 5.7 10*3/uL (ref 4.0–10.5)

## 2017-05-03 LAB — LIPID PANEL
CHOL/HDL RATIO: 3
CHOLESTEROL: 157 mg/dL (ref 0–200)
HDL: 57.6 mg/dL (ref >39.00)
LDL CALC: 86 mg/dL (ref 0–99)
NonHDL: 99.89
Triglycerides: 67 mg/dL (ref 0.0–149.0)
VLDL: 13.4 mg/dL (ref 0.0–40.0)

## 2017-05-03 LAB — COMPREHENSIVE METABOLIC PANEL
ALBUMIN: 3.7 g/dL (ref 3.5–5.2)
ALK PHOS: 49 U/L (ref 39–117)
ALT: 15 U/L (ref 0–35)
AST: 14 U/L (ref 0–37)
BUN: 8 mg/dL (ref 6–23)
CO2: 26 mEq/L (ref 19–32)
Calcium: 8.9 mg/dL (ref 8.4–10.5)
Chloride: 105 mEq/L (ref 96–112)
Creatinine, Ser: 0.81 mg/dL (ref 0.40–1.20)
GFR: 84.09 mL/min (ref >60.00)
GLUCOSE: 93 mg/dL (ref 70–99)
POTASSIUM: 4.1 meq/L (ref 3.5–5.1)
Sodium: 137 mEq/L (ref 135–145)
TOTAL PROTEIN: 6.7 g/dL (ref 6.0–8.3)
Total Bilirubin: 0.5 mg/dL (ref 0.2–1.2)

## 2017-05-03 LAB — HEMOGLOBIN A1C: HEMOGLOBIN A1C: 5.4 % (ref 4.6–6.5)

## 2017-05-03 LAB — TSH: TSH: 2.84 u[IU]/mL (ref 0.35–4.50)

## 2017-05-10 ENCOUNTER — Encounter: Payer: Self-pay | Admitting: Family Medicine

## 2017-05-10 ENCOUNTER — Ambulatory Visit (INDEPENDENT_AMBULATORY_CARE_PROVIDER_SITE_OTHER): Payer: 59 | Admitting: Family Medicine

## 2017-05-10 VITALS — BP 128/76 | HR 87 | Temp 98.1°F | Ht 67.0 in | Wt 202.5 lb

## 2017-05-10 DIAGNOSIS — E669 Obesity, unspecified: Secondary | ICD-10-CM | POA: Diagnosis not present

## 2017-05-10 DIAGNOSIS — Z0000 Encounter for general adult medical examination without abnormal findings: Secondary | ICD-10-CM

## 2017-05-10 DIAGNOSIS — F909 Attention-deficit hyperactivity disorder, unspecified type: Secondary | ICD-10-CM

## 2017-05-10 DIAGNOSIS — G44011 Episodic cluster headache, intractable: Secondary | ICD-10-CM

## 2017-05-10 NOTE — Patient Instructions (Addendum)
Try to get most of your carbohydrates from produce (with the exception of white potatoes)  Eat less bread/pasta/rice/snack foods/cereals/sweets and other items from the middle of the grocery store (processed carbs)   Think about joining weight watchers    We will send for your last pap

## 2017-05-10 NOTE — Progress Notes (Signed)
Subjective:    Patient ID: Kimberly Torres, female    DOB: 1981-04-27, 36 y.o.   MRN: [id number]  HPI Here for health maintenance exam and to review chronic medical problems    Trying to take care of herself  Hard as a mother   Wt Readings from Last 3 Encounters:  05/10/17 202 lb 8 oz (91.9 kg)  06/22/15 188 lb (85.3 kg)  08/30/14 163 lb 3.2 oz (74 kg)  exercise - walks 2 miles per night with dogs (weather dep) or the elliptical Diet- ok overall  Was taking seroquel for sleep in the past - caused so much weight gain that she stopped  31.72 kg/m   She is sleeping ok now  Takes hydroxyzine 100 mg for sleep and 10 mg of melantonin  This works well  Sees Dr Hagen-neurology  Still has some bad nights  Headaches- much better/no cycle in several years    Last gyn visit was last dec (due soon) - and had nl pap Remote hx of hpv -gets paps yearly  No OC continuous  No breakthrough bleeding    Tetanus shot 3/16  Flu shot 9/18   Cholesterol Lab Results  Component Value Date   CHOL 157 05/03/2017   CHOL 157 08/17/2014   CHOL 162 05/13/2013   Lab Results  Component Value Date   HDL 57.60 05/03/2017   HDL 62.10 08/17/2014   HDL 64.30 05/13/2013   Lab Results  Component Value Date   LDLCALC 86 05/03/2017   LDLCALC 80 08/17/2014   LDLCALC 83 05/13/2013   Lab Results  Component Value Date   TRIG 67.0 05/03/2017   TRIG 77.0 08/17/2014   TRIG 75.0 05/13/2013   Lab Results  Component Value Date   CHOLHDL 3 05/03/2017   CHOLHDL 3 08/17/2014   CHOLHDL 3 05/13/2013   No results found for: LDLDIRECT Very good profile overall- with high HDL   Other labs: Results for orders placed or performed in visit on 05/03/17  TSH  Result Value Ref Range   TSH 2.84 0.35 - 4.50 uIU/mL  Lipid panel  Result Value Ref Range   Cholesterol 157 0 - 200 mg/dL   Triglycerides 67.0 0.0 - 149.0 mg/dL   HDL 57.60 >39.00 mg/dL   VLDL 13.4 0.0 - 40.0 mg/dL   LDL Cholesterol  86 0 - 99 mg/dL   Total CHOL/HDL Ratio 3    NonHDL 99.89   Hemoglobin A1c  Result Value Ref Range   Hgb A1c MFr Bld 5.4 4.6 - 6.5 %  Comprehensive metabolic panel  Result Value Ref Range   Sodium 137 135 - 145 mEq/L   Potassium 4.1 3.5 - 5.1 mEq/L   Chloride 105 96 - 112 mEq/L   CO2 26 19 - 32 mEq/L   Glucose, Bld 93 70 - 99 mg/dL   BUN 8 6 - 23 mg/dL   Creatinine, Ser 0.81 0.40 - 1.20 mg/dL   Total Bilirubin 0.5 0.2 - 1.2 mg/dL   Alkaline Phosphatase 49 39 - 117 U/L   AST 14 0 - 37 U/L   ALT 15 0 - 35 U/L   Total Protein 6.7 6.0 - 8.3 g/dL   Albumin 3.7 3.5 - 5.2 g/dL   Calcium 8.9 8.4 - 10.5 mg/dL   GFR 84.09 >60.00 mL/min  CBC with Differential/Platelet  Result Value Ref Range   WBC 5.7 4.0 - 10.5 K/uL   RBC 4.25 3.87 - 5.11 Mil/uL   Hemoglobin 12.7 12.0 -  15.0 g/dL   HCT 38.7 36.0 - 46.0 %   MCV 91.0 78.0 - 100.0 fl   MCHC 32.9 30.0 - 36.0 g/dL   RDW 12.5 11.5 - 15.5 %   Platelets 266.0 150.0 - 400.0 K/uL   Neutrophils Relative % 56.8 43.0 - 77.0 %   Lymphocytes Relative 32.9 12.0 - 46.0 %   Monocytes Relative 7.6 3.0 - 12.0 %   Eosinophils Relative 2.0 0.0 - 5.0 %   Basophils Relative 0.7 0.0 - 3.0 %   Neutro Abs 3.3 1.4 - 7.7 K/uL   Lymphs Abs 1.9 0.7 - 4.0 K/uL   Monocytes Absolute 0.4 0.1 - 1.0 K/uL   Eosinophils Absolute 0.1 0.0 - 0.7 K/uL   Basophils Absolute 0.0 0.0 - 0.1 K/uL     Patient Active Problem List   Diagnosis Date Noted  . Cough 06/22/2015  . Adverse drug effect 08/05/2014  . 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 HNP (herniated nucleus pulposus), lumbar 09/28/2013  . 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 Low back pain 08/14/2013  . Neck muscle strain 05/22/2013  . Cluster headache 02/15/2012  . Routine general medical examination at a health care facility 01/28/2012  . Screen for STD (sexually transmitted disease) 01/28/2012  . Sinus tachycardia 09/11/2010  . PALPITATIONS 08/11/2010  . ANXIETY 04/15/2009  .  HEMANGIOMA, HEPATIC 03/14/2009  . DEPRESSION 06/25/2007  . Attention deficit hyperactivity disorder (ADHD) 06/25/2007  . SCOLIOSIS 06/25/2007   Past Medical History:  Diagnosis Date  . Acne    sees derm- on accutane  . Anxiety   . Chondromalacia of right knee    right  . Depression   . Dysrhythmia    hx SVT  . H/O seasonal allergies   . Headache(784.0)    cluster migraines  . Insomnia   . Sinus tachycardia    eval by Dr. Stanford Breed in 2007: monior revealed sinus tach. vs EAT; Echo 10/07: EF 70%  . Varicose veins    eval. by vascular surgeon and procedure scheduled to correct   Past Surgical History:  Procedure Laterality Date  . CESAREAN SECTION  2009  . DIAGNOSTIC LAPAROSCOPY  2010   scar tissue  . KNEE SURGERY  04/10/2010  . LUMBAR LAMINECTOMY/DECOMPRESSION MICRODISCECTOMY Right 09/28/2013   Procedure: LUMBAR LAMINECTOMY/DECOMPRESSION MICRODISCECTOMY 1 LEVEL,LUMBAR  THREE-FOUR RIGHT ;  Surgeon: Charlie Pitter, MD;  Location: [facility];  Service: Neurosurgery;  Laterality: Right;  right  . MYRINGOTOMY     Social History   Tobacco Use  . Smoking status: Never Smoker  . Smokeless tobacco: Never Used  Substance Use Topics  . Alcohol use: Yes    Alcohol/week: 0.0 oz    Comment: occasional to rare  . Drug use: No   Family History  Problem Relation Age of Onset  . Hypertension Mother   . ADD / ADHD Mother   . Coronary artery disease Unknown        grandfather  . Heart attack Unknown        grandfather  . Breast cancer Unknown        grandmother  . Bipolar disorder Unknown        grandmother  . ADD / ADHD Sister   . COPD Maternal Grandfather    No Active Allergies Current Outpatient Medications on File Prior to Visit  Medication Sig Dispense Refill  . amphetamine-dextroamphetamine (ADDERALL) 15 MG tablet Take 1 tablet by mouth 2 (two) times daily.    . fluticasone (  FLONASE) 50 MCG/ACT nasal spray Place 2 sprays into both nostrils daily as needed for allergies.      . folic acid (FOLVITE) 1 MG tablet Take 1 mg by mouth daily.    . hydrOXYzine (ATARAX/VISTARIL) 50 MG tablet Take 50-100 mg by mouth at bedtime as needed.    . Melatonin 3 MG CAPS Take 2 capsules by mouth at bedtime.    . Norethindrone-Ethinyl Estradiol-Fe Biphas (LO LOESTRIN FE) 1 MG-10 MCG / 10 MCG tablet Take 1 tablet by mouth daily.    . Omega-3 Fatty Acids (FISH OIL) 1000 MG CAPS Take 2 capsules by mouth daily.     Current Facility-Administered Medications on File Prior to Visit  Medication Dose Route Frequency Provider Last Rate Last Dose  . ipratropium-albuterol (DUONEB) 0.5-2.5 (3) MG/3ML nebulizer solution 3 mL  3 mL Nebulization Once Versie Starks, PA        Review of Systems  Constitutional: Positive for fatigue. Negative for activity change, appetite change, fever and unexpected weight change.  HENT: Negative for congestion, ear pain, rhinorrhea, sinus pressure and sore throat.   Eyes: Negative for pain, redness and visual disturbance.  Respiratory: Negative for cough, shortness of breath and wheezing.   Cardiovascular: Negative for chest pain and palpitations.  Gastrointestinal: Negative for abdominal pain, blood in stool, constipation and diarrhea.  Endocrine: Negative for polydipsia and polyuria.  Genitourinary: Negative for dysuria, frequency and urgency.  Musculoskeletal: Negative for arthralgias, back pain and myalgias.  Skin: Negative for pallor and rash.  Allergic/Immunologic: Negative for environmental allergies.  Neurological: Positive for headaches. Negative for dizziness and syncope.       Headaches are improved   Hematological: Negative for adenopathy. Does not bruise/bleed easily.  Psychiatric/Behavioral: Negative for decreased concentration and dysphoric mood. The patient is not nervous/anxious.        Objective:   Physical Exam  Constitutional: She appears well-developed and well-nourished. No distress.  HENT:  Head: Normocephalic and atraumatic.    Right Ear: External ear normal.  Left Ear: External ear normal.  Nose: Nose normal.  Mouth/Throat: Oropharynx is clear and moist.  Eyes: Conjunctivae and EOM are normal. Pupils are equal, round, and reactive to light. Right eye exhibits no discharge. Left eye exhibits no discharge. No scleral icterus.  Neck: Normal range of motion. Neck supple. No JVD present. Carotid bruit is not present. No thyromegaly present.  Cardiovascular: Normal rate, regular rhythm, normal heart sounds and intact distal pulses. Exam reveals no gallop.  Pulmonary/Chest: Effort normal and breath sounds normal. No respiratory distress. She has no wheezes. She has no rales.  Abdominal: Soft. Bowel sounds are normal. She exhibits no distension and no mass. There is no tenderness.  Musculoskeletal: She exhibits no edema or tenderness.  Lymphadenopathy:    She has no cervical adenopathy.  Neurological: She is alert. She has normal reflexes. No cranial nerve deficit. She exhibits normal muscle tone. Coordination normal.  Skin: Skin is warm and dry. No rash noted. No erythema. No pallor.  Psychiatric: She has a normal mood and affect.          Assessment & Plan:   Problem List Items Addressed This Visit      Nervous and Auditory   Cluster headache    Improved Now off seroquel         Other   Attention deficit hyperactivity disorder (ADHD)    Stable with adderall bid       Obesity (BMI 30-39.9)    This  was previously worsened with seroquel-now off of it  Working on exercise  Discussed how this problem influences overall health and the risks it imposes  Reviewed plan for weight loss with lower calorie diet (via better food choices and also portion control or program like weight watchers) and exercise building up to or more than 30 minutes 5 days per week including some aerobic activity    Disc low glycemic diet and/ or wt watchers program       Relevant Medications   amphetamine-dextroamphetamine  (ADDERALL) 15 MG tablet   Routine general medical examination at a health care facility - Primary    Reviewed health habits including diet and exercise and skin cancer prevention Reviewed appropriate screening tests for age  Also reviewed health mt list, fam hx and immunization status , as well as social and family history   See HPI Abs reviewed Enc wt loss with healthy diet and exercise (low glycemic may help) Overall good health habits  Will send to gyn for last pap report

## 2017-05-12 DIAGNOSIS — E669 Obesity, unspecified: Secondary | ICD-10-CM | POA: Insufficient documentation

## 2017-05-12 NOTE — Assessment & Plan Note (Signed)
This was previously worsened with seroquel-now off of it  Working on exercise  Discussed how this problem influences overall health and the risks it imposes  Reviewed plan for weight loss with lower calorie diet (via better food choices and also portion control or program like weight watchers) and exercise building up to or more than 30 minutes 5 days per week including some aerobic activity    Disc low glycemic diet and/ or wt watchers program

## 2017-05-12 NOTE — Assessment & Plan Note (Addendum)
Reviewed health habits including diet and exercise and skin cancer prevention Reviewed appropriate screening tests for age  Also reviewed health mt list, fam hx and immunization status , as well as social and family history   See HPI Abs reviewed Enc wt loss with healthy diet and exercise (low glycemic may help) Overall good health habits  Will send to gyn for last pap report

## 2017-05-12 NOTE — Assessment & Plan Note (Signed)
Improved Now off seroquel

## 2017-05-12 NOTE — Assessment & Plan Note (Signed)
Stable with adderall bid

## 2017-05-29 DIAGNOSIS — N6320 Unspecified lump in the left breast, unspecified quadrant: Secondary | ICD-10-CM | POA: Diagnosis not present

## 2017-05-30 MED FILL — DEXTROAMP-AMPHETAMIN 15 MG: 15 | 90 days supply | Qty: 180 | Fill #0

## 2017-06-13 DIAGNOSIS — Z803 Family history of malignant neoplasm of breast: Secondary | ICD-10-CM | POA: Diagnosis not present

## 2017-06-13 DIAGNOSIS — R922 Inconclusive mammogram: Secondary | ICD-10-CM | POA: Diagnosis not present

## 2017-06-13 DIAGNOSIS — N6322 Unspecified lump in the left breast, upper inner quadrant: Secondary | ICD-10-CM | POA: Diagnosis not present

## 2017-06-16 ENCOUNTER — Encounter: Payer: Self-pay | Admitting: Family Medicine

## 2017-06-16 ENCOUNTER — Ambulatory Visit: Payer: Self-pay | Admitting: Family Medicine

## 2017-06-16 VITALS — BP 122/86 | HR 117 | Temp 98.8°F | Resp 17

## 2017-06-16 DIAGNOSIS — J020 Streptococcal pharyngitis: Secondary | ICD-10-CM

## 2017-06-16 LAB — POCT RAPID STREP A (OFFICE): Rapid Strep A Screen: NEGATIVE

## 2017-06-16 MED ORDER — AMOXICILLIN-POT CLAVULANATE 875-125 MG PO TABS: 1.0000 | ORAL_TABLET | Freq: Two times a day (BID) | ORAL | 0 refills | Status: DC

## 2017-06-16 MED ORDER — FLUCONAZOLE 150 MG PO TABS: 150.0000 mg | ORAL_TABLET | Freq: Once | ORAL | 0 refills | Status: AC

## 2017-06-16 MED FILL — FLUCONAZOLE 150 MG TABLET: 150 | 1 days supply | Qty: 1 | Fill #0

## 2017-06-16 MED FILL — AMOX TR-K CLV 875-125 MG TA: 875-125 | 20 days supply | Qty: 20 | Fill #0

## 2017-06-16 NOTE — Progress Notes (Signed)
Subjective:  Kimberly Torres is a 37 y.o. female who presents for evaluation of possible strep throat..  Symptoms include achiness and sore throat.  Onset of symptoms was 1 day ago and has worsened over time since that time.  She has remained afebrile Treatment to date:  ibuprofen.  Recent exposure to son who was diagnosed and treated for streptococcal infection.  The following portions of the patient's history were reviewed and updated as appropriate:  allergies, current medications and past medical history.  Constitutional: positive for fatigue Ears, nose, mouth, throat, and face: positive for sore throat Respiratory: negative Cardiovascular: negative Objective:  BP 122/86 (BP Location: Right Arm, Patient Position: Sitting, Cuff Size: Normal)   Pulse (!) 117   Temp 98.8 F (37.1 C) (Oral)   Resp 17   SpO2 98%  General appearance: alert, cooperative, appears stated age and mild distress Throat: abnormal findings: moderate oropharyngeal erythema Lungs: clear to auscultation bilaterally Heart: regular rate and rhythm, S1, S2 normal, no murmur, click, rub or gallop Skin: Skin color, texture, turgor normal. No rashes or lesions Lymph nodes: Cervical, supraclavicular, and axillary nodes normal.   Assessment:  Acute Pharyngitis likely caused by streptococcal infection.    Plan:  Start Augmentin 875-125 mg, 1 tablet, twice daily x 10 days. I have prescribed Diflucan tablet once if vaginal irritation occurs.   The patient was given clear instructions to go to ER or return to medical center if symptoms do not improve, worsen or new problems develop. The patient verbalized understanding.  Meds ordered this encounter  Medications  . amoxicillin-clavulanate (AUGMENTIN) 875-125 MG tablet    Sig: Take 1 tablet by mouth 2 (two) times daily.    Dispense:  20 tablet    Refill:  0  . fluconazole (DIFLUCAN) 150 MG tablet    Sig: Take 1 tablet (150 mg total) by mouth once for 1 dose.   Dispense:  1 tablet    Refill:  0   Carroll Sage. Kenton Kingfisher, MSN, FNP-C [address] [place]  [place], [place] [address] [phone]

## 2017-06-16 NOTE — Patient Instructions (Addendum)
Start Augmentin 1 tablet twice daily with food to avoid stomach upset. Complete all medication. If vaginal irritation occurs, take diflucan 150 mg once. If symptoms worsen, you develop a fever >101.5, or no improvement with completion of antibiotics, return for care or follow-up with PCP.     Strep Throat Strep throat is an infection of the throat. It is caused by germs. Strep throat spreads from person to person because of coughing, sneezing, or close contact. Follow these instructions at home: Medicines  Take over-the-counter and prescription medicines only as told by your doctor.  Take your antibiotic medicine as told by your doctor. Do not stop taking the medicine even if you feel better.  Have family members who also have a sore throat or fever go to a doctor. Eating and drinking  Do not share food, drinking cups, or personal items.  Try eating soft foods until your sore throat feels better.  Drink enough fluid to keep your pee (urine) clear or pale yellow. General instructions  Rinse your mouth (gargle) with a salt-water mixture 3-4 times per day or as needed. To make a salt-water mixture, stir -1 tsp of salt into 1 cup of warm water.  Make sure that all people in your house wash their hands well.  Rest.  Stay home from school or work until you have been taking antibiotics for 24 hours.  Keep all follow-up visits as told by your doctor. This is important. Contact a doctor if:  Your neck keeps getting bigger.  You get a rash, cough, or earache.  You cough up thick liquid that is green, yellow-brown, or bloody.  You have pain that does not get better with medicine.  Your problems get worse instead of getting better.  You have a fever. Get help right away if:  You throw up (vomit).  You get a very bad headache.  You neck hurts or it feels stiff.  You have chest pain or you are short of breath.  You have drooling, very bad throat pain, or changes in your  voice.  Your neck is swollen or the skin gets red and tender.  Your mouth is dry or you are peeing less than normal.  You keep feeling more tired or it is hard to wake up.  Your joints are red or they hurt. This information is not intended to replace advice given to you by your health care provider. Make sure you discuss any questions you have with your health care provider. Document Released: 11/07/2007 Document Revised: 01/18/2016 Document Reviewed: 09/13/2014 Elsevier Interactive Patient Education  [employer].

## 2017-06-18 ENCOUNTER — Telehealth: Payer: Self-pay | Admitting: Emergency Medicine

## 2017-06-18 NOTE — Telephone Encounter (Signed)
Called patient to follow up with her to see how see is feeling since her visit, patient states she is feeling much better and thanked me for the call

## 2017-07-16 DIAGNOSIS — F39 Unspecified mood [affective] disorder: Secondary | ICD-10-CM | POA: Diagnosis not present

## 2017-07-16 DIAGNOSIS — F411 Generalized anxiety disorder: Secondary | ICD-10-CM | POA: Diagnosis not present

## 2017-07-16 DIAGNOSIS — F902 Attention-deficit hyperactivity disorder, combined type: Secondary | ICD-10-CM | POA: Diagnosis not present

## 2017-10-15 DIAGNOSIS — F902 Attention-deficit hyperactivity disorder, combined type: Secondary | ICD-10-CM | POA: Diagnosis not present

## 2017-10-15 DIAGNOSIS — F411 Generalized anxiety disorder: Secondary | ICD-10-CM | POA: Diagnosis not present

## 2017-10-15 DIAGNOSIS — F39 Unspecified mood [affective] disorder: Secondary | ICD-10-CM | POA: Diagnosis not present

## 2018-01-14 DIAGNOSIS — F39 Unspecified mood [affective] disorder: Secondary | ICD-10-CM | POA: Diagnosis not present

## 2018-01-14 DIAGNOSIS — F902 Attention-deficit hyperactivity disorder, combined type: Secondary | ICD-10-CM | POA: Diagnosis not present

## 2018-01-14 DIAGNOSIS — F411 Generalized anxiety disorder: Secondary | ICD-10-CM | POA: Diagnosis not present

## 2018-02-07 ENCOUNTER — Encounter: Payer: Self-pay | Admitting: Family Medicine

## 2018-02-07 ENCOUNTER — Ambulatory Visit: Payer: Self-pay | Admitting: Family Medicine

## 2018-02-07 VITALS — BP 136/88 | HR 78 | Temp 98.5°F | Wt 180.0 lb

## 2018-02-07 DIAGNOSIS — B029 Zoster without complications: Secondary | ICD-10-CM

## 2018-02-07 MED ORDER — BETAMETHASONE DIPROPIONATE 0.05 % EX CREA: TOPICAL_CREAM | Freq: Two times a day (BID) | CUTANEOUS | 0 refills | Status: DC

## 2018-02-07 MED ORDER — VALACYCLOVIR HCL 1 G PO TABS: 1000.0000 mg | ORAL_TABLET | Freq: Three times a day (TID) | ORAL | 0 refills | Status: AC

## 2018-02-07 MED ORDER — PREDNISONE 20 MG PO TABS: 40.0000 mg | ORAL_TABLET | Freq: Every day | ORAL | 0 refills | Status: AC

## 2018-02-07 NOTE — Patient Instructions (Signed)

## 2018-02-07 NOTE — Progress Notes (Signed)
Patient ID: Kimberly Torres, female    DOB: Feb 15, 1981, 37 y.o.   MRN: [id number]  PCP: Abner Greenspan, MD  Chief Complaint  Patient presents with  . Herpes Zoster    Subjective:  HPI Kimberly Torres is a 37 y.o. female presents for evaluation of rash which she suspects is shingles. Onset: 3 days ago Recent exposure as a co-worker recently developed shingles.  Initially started below her bra line on right side of her back and has increased in size and lesion has progressed to the right flank and abdomen She complains of itching and burring on right lateral neck although no lesions are present. She has attempted relief with benadryl which reduced itching and burning. Denies any prior occurrences of shingles. Social History   Socioeconomic History  . Marital status: Married    Spouse name: Not on file  . Number of children: Not on file  . Years of education: Not on file  . Highest education level: Not on file  Occupational History  . Occupation: Marine scientist: [employer]  . Financial resource strain: Not on file  . Food insecurity:    Worry: Not on file    Inability: Not on file  . Transportation needs:    Medical: Not on file    Non-medical: Not on file  Tobacco Use  . Smoking status: Never Smoker  . Smokeless tobacco: Never Used  Substance and Sexual Activity  . Alcohol use: Yes    Alcohol/week: 0.0 standard drinks    Comment: occasional to rare  . Drug use: No  . Sexual activity: Yes    Partners: Male    Birth control/protection: None  Lifestyle  . Physical activity:    Days per week: Not on file    Minutes per session: Not on file  . Stress: Not on file  Relationships  . Social connections:    Talks on phone: Not on file    Gets together: Not on file    Attends religious service: Not on file    Active member of club or organization: Not on file    Attends meetings of clubs or organizations: Not on file   Relationship status: Not on file  . Intimate partner violence:    Fear of current or ex partner: Not on file    Emotionally abused: Not on file    Physically abused: Not on file    Forced sexual activity: Not on file  Other Topics Concern  . Not on file  Social History Narrative   Family History:   GF CAD with MI in his 63s   MGM breast cancer in her 57s    GM with bipolar      Social History:   Marital Status: Married   Children: 1   Occupation: Software engineer   Her husband is a Engineer, water          Family History  Problem Relation Age of Onset  . Hypertension Mother   . ADD / ADHD Mother   . Coronary artery disease Unknown        grandfather  . Heart attack Unknown        grandfather  . Breast cancer Unknown        grandmother  . Bipolar disorder Unknown        grandmother  . ADD / ADHD Sister   . COPD Maternal Grandfather      Review of Systems  Patient Active Problem List   Diagnosis Date Noted  . Obesity (BMI 30-39.9) 05/12/2017  . Adverse drug effect 08/05/2014  . 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 HNP (herniated nucleus pulposus), lumbar 09/28/2013  . 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 Neck muscle strain 05/22/2013  . Cluster headache 02/15/2012  . Routine general medical examination at a health care facility 01/28/2012  . Sinus tachycardia 09/11/2010  . PALPITATIONS 08/11/2010  . ANXIETY 04/15/2009  . HEMANGIOMA, HEPATIC 03/14/2009  . DEPRESSION 06/25/2007  . Attention deficit hyperactivity disorder (ADHD) 06/25/2007  . SCOLIOSIS 06/25/2007    No Known Allergies  Prior to Admission medications   Medication Sig Start Date End Date Taking? Authorizing Provider  fluticasone (FLONASE) 50 MCG/ACT nasal spray Place 2 sprays into both nostrils daily as needed for allergies.    Yes [provider]  folic acid (FOLVITE) 1 MG tablet Take 1 mg by mouth daily.   Yes [provider]  hydrOXYzine  (ATARAX/VISTARIL) 50 MG tablet Take 50-100 mg by mouth at bedtime as needed.   Yes [provider]  Norethindrone-Ethinyl Estradiol-Fe Biphas (LO LOESTRIN FE) 1 MG-10 MCG / 10 MCG tablet Take 1 tablet by mouth daily.   Yes [provider]  Omega-3 Fatty Acids (FISH OIL) 1000 MG CAPS Take 2 capsules by mouth daily. 02/02/17  Yes [provider]  amoxicillin-clavulanate (AUGMENTIN) 875-125 MG tablet Take 1 tablet by mouth 2 (two) times daily. Patient not taking: Reported on 02/07/2018 06/16/17   Scot Jun, FNP  amphetamine-dextroamphetamine (ADDERALL) 15 MG tablet Take 1 tablet by mouth 2 (two) times daily. 06/04/16   [provider]  chlorpheniramine (CHLOR-TRIMETON) 4 MG tablet Take by mouth.    [provider]  ibuprofen (ADVIL,MOTRIN) 600 MG tablet Take by mouth.    [provider]  Melatonin 3 MG CAPS Take 2 capsules by mouth at bedtime.    [provider]    Past Medical, Surgical Family and Social History reviewed and updated.    Objective:   Today's Vitals   02/07/18 1048  BP: 136/88  Pulse: 78  Temp: 98.5 F (36.9 C)  SpO2: 99%  Weight: 180 lb (81.6 kg)    Wt Readings from Last 3 Encounters:  02/07/18 180 lb (81.6 kg)  05/10/17 202 lb 8 oz (91.9 kg)  06/22/15 188 lb (85.3 kg)     General appearance: alert, well developed, well nourished, cooperative and in no distress Head: Normocephalic, without obvious abnormality, atraumatic Respiratory: Respirations even and unlabored, normal respiratory rate Extremities: No gross deformities Physical Exam  Skin:      Assessment & Plan:  1. Herpes zoster without complication. Recommended no return for work, until 02/10/2018. Work note provided.  Standard treatment recommended. Red flags discussed.  Meds ordered this encounter  Medications  . valACYclovir (VALTREX) 1000 MG tablet    Sig: Take 1 tablet (1,000 mg total) by mouth 3 (three) times daily for 7 days.     Dispense:  20 tablet    Refill:  0  . predniSONE (DELTASONE) 20 MG tablet    Sig: Take 2 tablets (40 mg total) by mouth daily with breakfast for 5 days.    Dispense:  10 tablet    Refill:  0  . betamethasone dipropionate (DIPROLENE) 0.05 % cream    Sig: Apply topically 2 (two) times daily.    Dispense:  90 g    Refill:  0     If symptoms worsen or  do not improve, return for follow-up, follow-up with PCP, or at the emergency department if severity of symptoms warrant a higher level of care.    Carroll Sage. Kenton Kingfisher, MSN, FNP-C [facility]  [place], [place] [address] [phone]

## 2018-02-10 ENCOUNTER — Telehealth: Payer: Self-pay | Admitting: Emergency Medicine

## 2018-02-10 NOTE — Telephone Encounter (Signed)
Spoke with patient whom informed me that she is doing better. 

## 2018-03-10 DIAGNOSIS — H5203 Hypermetropia, bilateral: Secondary | ICD-10-CM | POA: Diagnosis not present

## 2018-03-11 DIAGNOSIS — D2261 Melanocytic nevi of right upper limb, including shoulder: Secondary | ICD-10-CM | POA: Diagnosis not present

## 2018-03-11 DIAGNOSIS — D225 Melanocytic nevi of trunk: Secondary | ICD-10-CM | POA: Diagnosis not present

## 2018-03-11 DIAGNOSIS — L700 Acne vulgaris: Secondary | ICD-10-CM | POA: Diagnosis not present

## 2018-03-11 DIAGNOSIS — D2272 Melanocytic nevi of left lower limb, including hip: Secondary | ICD-10-CM | POA: Diagnosis not present

## 2018-03-11 DIAGNOSIS — D2262 Melanocytic nevi of left upper limb, including shoulder: Secondary | ICD-10-CM | POA: Diagnosis not present

## 2018-03-11 DIAGNOSIS — D2271 Melanocytic nevi of right lower limb, including hip: Secondary | ICD-10-CM | POA: Diagnosis not present

## 2018-04-07 DIAGNOSIS — F411 Generalized anxiety disorder: Secondary | ICD-10-CM | POA: Diagnosis not present

## 2018-04-07 DIAGNOSIS — F902 Attention-deficit hyperactivity disorder, combined type: Secondary | ICD-10-CM | POA: Diagnosis not present

## 2018-04-07 DIAGNOSIS — F39 Unspecified mood [affective] disorder: Secondary | ICD-10-CM | POA: Diagnosis not present

## 2018-04-30 ENCOUNTER — Telehealth: Payer: Self-pay | Admitting: *Deleted

## 2018-04-30 MED ORDER — OSELTAMIVIR PHOSPHATE 75 MG PO CAPS: 75.0000 mg | ORAL_CAPSULE | Freq: Every day | ORAL | 0 refills | Status: DC

## 2018-04-30 NOTE — Telephone Encounter (Signed)
Pt notified Rx sent and since she works for [employer] she has already received her flu shot so chart updated

## 2018-04-30 NOTE — Telephone Encounter (Signed)
Spoke to pt states her husband was recently Dx with the flu, and she is requesting a prophylactic dose of Tamiflu be sent to [place]. pls advise

## 2018-04-30 NOTE — Telephone Encounter (Signed)
I sent it  Please make sure she had her flu shot already and put in her record  If not she needs one asap

## 2018-07-13 ENCOUNTER — Telehealth: Payer: Self-pay | Admitting: Family Medicine

## 2018-07-13 DIAGNOSIS — Z0000 Encounter for general adult medical examination without abnormal findings: Secondary | ICD-10-CM

## 2018-07-13 NOTE — Telephone Encounter (Signed)
-----   Message from Ellamae Sia sent at 07/08/2018  4:06 PM EST ----- Regarding: Lab orders for Friday, 2.14.20 Patient is scheduled for CPX labs, please order future labs, Thanks , Karna Christmas

## 2018-07-18 ENCOUNTER — Other Ambulatory Visit (INDEPENDENT_AMBULATORY_CARE_PROVIDER_SITE_OTHER): Payer: 59

## 2018-07-18 DIAGNOSIS — Z0000 Encounter for general adult medical examination without abnormal findings: Secondary | ICD-10-CM | POA: Diagnosis not present

## 2018-07-18 LAB — CBC WITH DIFFERENTIAL/PLATELET
BASOS PCT: 0.6 % (ref 0.0–3.0)
Basophils Absolute: 0 10*3/uL (ref 0.0–0.1)
Eosinophils Absolute: 0.3 10*3/uL (ref 0.0–0.7)
Eosinophils Relative: 3.9 % (ref 0.0–5.0)
HCT: 38.3 % (ref 36.0–46.0)
HEMOGLOBIN: 13.1 g/dL (ref 12.0–15.0)
Lymphocytes Relative: 30.2 % (ref 12.0–46.0)
Lymphs Abs: 2.1 10*3/uL (ref 0.7–4.0)
MCHC: 34.2 g/dL (ref 30.0–36.0)
MCV: 87 fl (ref 78.0–100.0)
Monocytes Absolute: 0.5 10*3/uL (ref 0.1–1.0)
Monocytes Relative: 7.2 % (ref 3.0–12.0)
Neutro Abs: 4.1 10*3/uL (ref 1.4–7.7)
Neutrophils Relative %: 58.1 % (ref 43.0–77.0)
Platelets: 273 10*3/uL (ref 150.0–400.0)
RBC: 4.4 Mil/uL (ref 3.87–5.11)
RDW: 12.6 % (ref 11.5–15.5)
WBC: 7 10*3/uL (ref 4.0–10.5)

## 2018-07-18 LAB — LIPID PANEL
Cholesterol: 143 mg/dL (ref 0–200)
HDL: 54.8 mg/dL (ref >39.00)
LDL CALC: 72 mg/dL (ref 0–99)
NonHDL: 88.61
Total CHOL/HDL Ratio: 3
Triglycerides: 83 mg/dL (ref 0.0–149.0)
VLDL: 16.6 mg/dL (ref 0.0–40.0)

## 2018-07-18 LAB — COMPREHENSIVE METABOLIC PANEL
ALT: 17 U/L (ref 0–35)
AST: 15 U/L (ref 0–37)
Albumin: 4 g/dL (ref 3.5–5.2)
Alkaline Phosphatase: 51 U/L (ref 39–117)
BUN: 8 mg/dL (ref 6–23)
CALCIUM: 9 mg/dL (ref 8.4–10.5)
CHLORIDE: 106 meq/L (ref 96–112)
CO2: 25 mEq/L (ref 19–32)
Creatinine, Ser: 0.88 mg/dL (ref 0.40–1.20)
GFR: 71.45 mL/min (ref >60.00)
Glucose, Bld: 95 mg/dL (ref 70–99)
Potassium: 4.3 mEq/L (ref 3.5–5.1)
Sodium: 138 mEq/L (ref 135–145)
Total Bilirubin: 0.5 mg/dL (ref 0.2–1.2)
Total Protein: 6.6 g/dL (ref 6.0–8.3)

## 2018-07-18 LAB — TSH: TSH: 3.88 u[IU]/mL (ref 0.35–4.50)

## 2018-07-22 ENCOUNTER — Ambulatory Visit (INDEPENDENT_AMBULATORY_CARE_PROVIDER_SITE_OTHER): Payer: 59 | Admitting: Family Medicine

## 2018-07-22 ENCOUNTER — Encounter: Payer: Self-pay | Admitting: Family Medicine

## 2018-07-22 VITALS — BP 118/76 | HR 83 | Temp 98.9°F | Ht 67.25 in | Wt 214.4 lb

## 2018-07-22 DIAGNOSIS — E669 Obesity, unspecified: Secondary | ICD-10-CM

## 2018-07-22 DIAGNOSIS — Z0000 Encounter for general adult medical examination without abnormal findings: Secondary | ICD-10-CM | POA: Diagnosis not present

## 2018-07-22 NOTE — Assessment & Plan Note (Signed)
Discussed how this problem influences overall health and the risks it imposes  Reviewed plan for weight loss with lower calorie diet (via better food choices and also portion control or program like weight watchers) and exercise building up to or more than 30 minutes 5 days per week including some aerobic activity   Enc making time for self care  She is making a plan for exercise  Disc diet lower in processed food

## 2018-07-22 NOTE — Patient Instructions (Addendum)
For weight loss Try to get most of your carbohydrates from produce (with the exception of white potatoes)  Eat less bread/pasta/rice/snack foods/cereals/sweets and other items from the middle of the grocery store (processed carbs)   Think about cutting back on caffeine for breast pain   Keep drinking lots of water   Follow up with gyn as planned

## 2018-07-22 NOTE — Assessment & Plan Note (Signed)
Reviewed health habits including diet and exercise and skin cancer prevention Reviewed appropriate screening tests for age  Also reviewed health mt list, fam hx and immunization status , as well as social and family history   See HPI Labs reviewed  Disc strategy for wt loss  Sent for last pap report / utd gyn care -appt upcoming soon  Adv she cut caffeine intake for c/o breast soreness

## 2018-07-22 NOTE — Progress Notes (Signed)
Subjective:    Patient ID: Kimberly Torres, female    DOB: [birth year], 38 y.o.   MRN: [id number]  HPI Here for health maintenance exam and to review chronic medical problems    Working  Feeling good overall   Wt Readings from Last 3 Encounters:  07/22/18 214 lb 6 oz (97.2 kg)  02/07/18 180 lb (81.6 kg)  05/10/17 202 lb 8 oz (91.9 kg)  not really taking care of herself (work and being a mom)  Hard to fit in exercise  Diet is fair /could be better  33.33 kg/m   Pap /exam  [facility]- was 1/19   (remote hx of abn pap and HPV)- had colposcopy and last 2 have been normal  Has appt next week  Takes OC lo estrin fe-periods are regular every 3 cycles - not heavy or painful  Self breast exam - she found a lump 12/19 and had a mammogram  It was normal  Still has occ breast pain in [place] - will disc with Dr Valentino Saxon   She drinks a fair amt of caffeine  Drinks a lot of water   No plans for pregnancy   Tetanus shot 3/16  HIV screen neg 8/13  Flu shot 10/19   Had a rash after eating sushi recently  Took benadryl and pepcid    Cholesterol  Lab Results  Component Value Date   CHOL 143 07/18/2018   CHOL 157 05/03/2017   CHOL 157 08/17/2014   Lab Results  Component Value Date   HDL 54.80 07/18/2018   HDL 57.60 05/03/2017   HDL 62.10 08/17/2014   Lab Results  Component Value Date   LDLCALC 72 07/18/2018   [place] 86 05/03/2017   [place] 80 08/17/2014   Lab Results  Component Value Date   TRIG 83.0 07/18/2018   TRIG 67.0 05/03/2017   TRIG 77.0 08/17/2014   Lab Results  Component Value Date   CHOLHDL 3 07/18/2018   CHOLHDL 3 05/03/2017   CHOLHDL 3 08/17/2014   No results found for: LDLDIRECT Eating fair  No red meat  No pork  Some chick filet fast food (does not always eat the bad stuff)   Other labs  Results for orders placed or performed in visit on 07/18/18  TSH  Result Value Ref Range   TSH 3.88 0.35 - 4.50 uIU/mL  Lipid panel  Result  Value Ref Range   Cholesterol 143 0 - 200 mg/dL   Triglycerides 83.0 0.0 - 149.0 mg/dL   HDL 54.80 >39.00 mg/dL   VLDL 16.6 0.0 - 40.0 mg/dL   LDL Cholesterol 72 0 - 99 mg/dL   Total CHOL/HDL Ratio 3    NonHDL 88.61   Comprehensive metabolic panel  Result Value Ref Range   Sodium 138 135 - 145 mEq/L   Potassium 4.3 3.5 - 5.1 mEq/L   Chloride 106 96 - 112 mEq/L   CO2 25 19 - 32 mEq/L   Glucose, Bld 95 70 - 99 mg/dL   BUN 8 6 - 23 mg/dL   Creatinine, Ser 0.88 0.40 - 1.20 mg/dL   Total Bilirubin 0.5 0.2 - 1.2 mg/dL   Alkaline Phosphatase 51 39 - 117 U/L   AST 15 0 - 37 U/L   ALT 17 0 - 35 U/L   Total Protein 6.6 6.0 - 8.3 g/dL   Albumin 4.0 3.5 - 5.2 g/dL   Calcium 9.0 8.4 - 10.5 mg/dL   GFR 71.45 >60.00 mL/min  CBC  with Differential/Platelet  Result Value Ref Range   WBC 7.0 4.0 - 10.5 K/uL   RBC 4.40 3.87 - 5.11 Mil/uL   Hemoglobin 13.1 12.0 - 15.0 g/dL   HCT 38.3 36.0 - 46.0 %   MCV 87.0 78.0 - 100.0 fl   MCHC 34.2 30.0 - 36.0 g/dL   RDW 12.6 11.5 - 15.5 %   Platelets 273.0 150.0 - 400.0 K/uL   Neutrophils Relative % 58.1 43.0 - 77.0 %   Lymphocytes Relative 30.2 12.0 - 46.0 %   Monocytes Relative 7.2 3.0 - 12.0 %   Eosinophils Relative 3.9 0.0 - 5.0 %   Basophils Relative 0.6 0.0 - 3.0 %   Neutro Abs 4.1 1.4 - 7.7 K/uL   Lymphs Abs 2.1 0.7 - 4.0 K/uL   Monocytes Absolute 0.5 0.1 - 1.0 K/uL   Eosinophils Absolute 0.3 0.0 - 0.7 K/uL   Basophils Absolute 0.0 0.0 - 0.1 K/uL    Headaches are very well controlled   Goes to derm for skin screen   Patient Active Problem List   Diagnosis Date Noted  . Obesity (BMI 30-39.9) 05/12/2017  . Adverse drug effect 08/05/2014  . 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 HNP (herniated nucleus pulposus), lumbar 09/28/2013  . 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 Neck muscle strain 05/22/2013  . Cluster headache 02/15/2012  . Routine general medical examination at a health care facility 01/28/2012  .  Sinus tachycardia 09/11/2010  . PALPITATIONS 08/11/2010  . ANXIETY 04/15/2009  . HEMANGIOMA, HEPATIC 03/14/2009  . DEPRESSION 06/25/2007  . Attention deficit hyperactivity disorder (ADHD) 06/25/2007  . SCOLIOSIS 06/25/2007   Past Medical History:  Diagnosis Date  . Acne    sees derm- on accutane  . Anxiety   . Chondromalacia of right knee    right  . Depression   . Dysrhythmia    hx SVT  . H/O seasonal allergies   . Headache(784.0)    cluster migraines  . Insomnia   . Sinus tachycardia    eval by Dr. Stanford Breed in 2007: monior revealed sinus tach. vs EAT; Echo 10/07: EF 70%  . Varicose veins    eval. by vascular surgeon and procedure scheduled to correct   Past Surgical History:  Procedure Laterality Date  . CESAREAN SECTION  2009  . DIAGNOSTIC LAPAROSCOPY  2010   scar tissue  . KNEE SURGERY  04/10/2010  . LUMBAR LAMINECTOMY/DECOMPRESSION MICRODISCECTOMY Right 09/28/2013   Procedure: LUMBAR LAMINECTOMY/DECOMPRESSION MICRODISCECTOMY 1 LEVEL,LUMBAR  THREE-FOUR RIGHT ;  Surgeon: Charlie Pitter, MD;  Location: [facility];  Service: Neurosurgery;  Laterality: Right;  right  . MYRINGOTOMY     Social History   Tobacco Use  . Smoking status: Never Smoker  . Smokeless tobacco: Never Used  Substance Use Topics  . Alcohol use: Yes    Alcohol/week: 0.0 standard drinks    Comment: occasional to rare  . Drug use: No   Family History  Problem Relation Age of Onset  . Hypertension Mother   . ADD / ADHD Mother   . Coronary artery disease Unknown        grandfather  . Heart attack Unknown        grandfather  . Breast cancer Unknown        grandmother  . Bipolar disorder Unknown        grandmother  . ADD / ADHD Sister   . COPD Maternal Grandfather    Allergies  Allergen Reactions  .  Shellfish Allergy     Questionable - did have rash once    Current Outpatient Medications on File Prior to Visit  Medication Sig Dispense Refill  . chlorpheniramine (CHLOR-TRIMETON) 4 MG  tablet Take 4 mg by mouth daily as needed.     . fluticasone (FLONASE) 50 MCG/ACT nasal spray Place 2 sprays into both nostrils daily as needed for allergies.     . folic acid (FOLVITE) 1 MG tablet Take 1 mg by mouth daily.    . hydrOXYzine (ATARAX/VISTARIL) 50 MG tablet Take 50-100 mg by mouth at bedtime as needed.    Marland Kitchen ibuprofen (ADVIL,MOTRIN) 600 MG tablet Take by mouth.    . Melatonin 3 MG CAPS Take 2 capsules by mouth at bedtime.    . Norethindrone-Ethinyl Estradiol-Fe Biphas (LO LOESTRIN FE) 1 MG-10 MCG / 10 MCG tablet Take 1 tablet by mouth daily.    . Omega-3 Fatty Acids (FISH OIL) 1000 MG CAPS Take 2 capsules by mouth daily.     Current Facility-Administered Medications on File Prior to Visit  Medication Dose Route Frequency Provider Last Rate Last Dose  . ipratropium-albuterol (DUONEB) 0.5-2.5 (3) MG/3ML nebulizer solution 3 mL  3 mL Nebulization Once Versie Starks, PA-C        Review of Systems  Constitutional: Negative for activity change, appetite change, fatigue, fever and unexpected weight change.  HENT: Negative for congestion, ear pain, rhinorrhea, sinus pressure and sore throat.   Eyes: Negative for pain, redness and visual disturbance.  Respiratory: Negative for cough, shortness of breath and wheezing.   Cardiovascular: Negative for chest pain and palpitations.  Gastrointestinal: Negative for abdominal pain, blood in stool, constipation and diarrhea.  Endocrine: Negative for polydipsia and polyuria.  Genitourinary: Negative for dysuria, frequency and urgency.  Musculoskeletal: Negative for arthralgias, back pain and myalgias.  Skin: Negative for pallor and rash.  Allergic/Immunologic: Negative for environmental allergies.  Neurological: Negative for dizziness, syncope and headaches.  Hematological: Negative for adenopathy. Does not bruise/bleed easily.  Psychiatric/Behavioral: Negative for decreased concentration and dysphoric mood. The patient is not nervous/anxious.         Objective:   Physical Exam Constitutional:      General: She is not in acute distress.    Appearance: Normal appearance. She is well-developed. She is obese.  HENT:     Head: Normocephalic and atraumatic.     Right Ear: Tympanic membrane, ear canal and external ear normal.     Left Ear: Tympanic membrane, ear canal and external ear normal.     Nose: Nose normal.     Mouth/Throat:     Mouth: Mucous membranes are moist.     Pharynx: Oropharynx is clear.  Eyes:     General: No scleral icterus.       Right eye: No discharge.        Left eye: No discharge.     Conjunctiva/sclera: Conjunctivae normal.     Pupils: Pupils are equal, round, and reactive to light.  Neck:     Musculoskeletal: Normal range of motion and neck supple.     Thyroid: No thyromegaly.     Vascular: No carotid bruit or JVD.  Cardiovascular:     Rate and Rhythm: Normal rate and regular rhythm.     Pulses: Normal pulses.     Heart sounds: Normal heart sounds. No gallop.   Pulmonary:     Effort: Pulmonary effort is normal. No respiratory distress.     Breath sounds: Normal breath sounds.  No wheezing or rales.  Chest:     Chest wall: No tenderness.  Abdominal:     General: Bowel sounds are normal. There is no distension.     Palpations: Abdomen is soft. There is no mass.     Tenderness: There is no abdominal tenderness.  Genitourinary:    Comments: Breast and pelvic exam def to gyn provider Musculoskeletal:        General: No tenderness.  Lymphadenopathy:     Cervical: No cervical adenopathy.  Skin:    General: Skin is warm and dry.     Coloration: Skin is not jaundiced or pale.     Findings: No erythema or rash.     Comments: Stable brown nevi on back and trunk   Neurological:     General: No focal deficit present.     Mental Status: She is alert.     Cranial Nerves: No cranial nerve deficit.     Motor: No abnormal muscle tone.     Coordination: Coordination normal.     Gait: Gait normal.      Deep Tendon Reflexes: Reflexes are normal and symmetric. Reflexes normal.  Psychiatric:        Mood and Affect: Mood normal.           Assessment & Plan:   Problem List Items Addressed This Visit      Other   Routine general medical examination at a health care facility - Primary    Reviewed health habits including diet and exercise and skin cancer prevention Reviewed appropriate screening tests for age  Also reviewed health mt list, fam hx and immunization status , as well as social and family history   See HPI Labs reviewed  Disc strategy for wt loss  Sent for last pap report / utd gyn care -appt upcoming soon  Adv she cut caffeine intake for c/o breast soreness       Obesity (BMI 30-39.9)    Discussed how this problem influences overall health and the risks it imposes  Reviewed plan for weight loss with lower calorie diet (via better food choices and also portion control or program like weight watchers) and exercise building up to or more than 30 minutes 5 days per week including some aerobic activity   Enc making time for self care  She is making a plan for exercise  Disc diet lower in processed food

## 2018-07-23 ENCOUNTER — Ambulatory Visit (INDEPENDENT_AMBULATORY_CARE_PROVIDER_SITE_OTHER): Payer: Self-pay | Admitting: Physician Assistant

## 2018-07-23 VITALS — BP 138/86 | HR 98 | Temp 98.3°F | Resp 20 | Wt 192.0 lb

## 2018-07-23 DIAGNOSIS — J111 Influenza due to unidentified influenza virus with other respiratory manifestations: Secondary | ICD-10-CM

## 2018-07-23 MED ORDER — BENZONATATE 100 MG PO CAPS: 100.0000 mg | ORAL_CAPSULE | Freq: Two times a day (BID) | ORAL | 0 refills | Status: DC | PRN

## 2018-07-23 MED ORDER — OSELTAMIVIR PHOSPHATE 75 MG PO CAPS: 75.0000 mg | ORAL_CAPSULE | Freq: Two times a day (BID) | ORAL | 0 refills | Status: AC

## 2018-07-23 NOTE — Patient Instructions (Addendum)
Thank you for choosing [facility] for your health care needs.  You have been diagnosed with influenza (the Flu).  Take medications as prescribed: Meds ordered this encounter  Medications  . oseltamivir (TAMIFLU) 75 MG capsule    Sig: Take 1 capsule (75 mg total) by mouth 2 (two) times daily for 5 days.    Dispense:  10 capsule    Refill:  0    Order Specific Question:   Supervising Provider    Answer:   MILLER, BRIAN [[id number]]  . benzonatate (TESSALON) 100 MG capsule    Sig: Take 1 capsule (100 mg total) by mouth 2 (two) times daily as needed for cough.    Dispense:  20 capsule    Refill:  0    Order Specific Question:   Supervising Provider    Answer:   Sabra Heck, BRIAN [[id number]]   Increase fluids. Rest. May use Flonase nasal spray.  Follow-up with family physician or urgent care in 4-5 days if symptoms not improving, sooner with any worsening symptoms.  Influenza, Adult Influenza is also called "the flu." It is an infection in the lungs, nose, and throat (respiratory tract). It is caused by a virus. The flu causes symptoms that are similar to symptoms of a cold. It also causes a high fever and body aches. The flu spreads easily from person to person (is contagious). Getting a flu shot (influenza vaccination) every year is the best way to prevent the flu. What are the causes? This condition is caused by the influenza virus. You can get the virus by:  Breathing in droplets that are in the air from the cough or sneeze of a person who has the virus.  Touching something that has the virus on it (is contaminated) and then touching your mouth, nose, or eyes. What increases the risk? Certain things may make you more likely to get the flu. These include:  Not washing your hands often.  Having close contact with many people during cold and flu season.  Touching your mouth, eyes, or nose without first washing your hands.  Not getting a flu shot every year. You may have a higher risk for the  flu, along with serious problems such as a lung infection (pneumonia), if you:  Are older than 65.  Are pregnant.  Have a weakened disease-fighting system (immune system) because of a disease or taking certain medicines.  Have a long-term (chronic) illness, such as: ? Heart, kidney, or lung disease. ? Diabetes. ? Asthma.  Have a liver disorder.  Are very overweight (morbidly obese).  Have anemia. This is a condition that affects your red blood cells. What are the signs or symptoms? Symptoms usually begin suddenly and last 4-14 days. They may include:  Fever and chills.  Headaches, body aches, or muscle aches.  Sore throat.  Cough.  Runny or stuffy (congested) nose.  Chest discomfort.  Not wanting to eat as much as normal (poor appetite).  Weakness or feeling tired (fatigue).  Dizziness.  Feeling sick to your stomach (nauseous) or throwing up (vomiting). How is this treated? If the flu is found early, you can be treated with medicine that can help reduce how bad the illness is and how long it lasts (antiviral medicine). This may be given by mouth (orally) or through an IV tube. Taking care of yourself at home can help your symptoms get better. Your doctor may suggest:  Taking over-the-counter medicines.  Drinking plenty of fluids. The flu often goes away on its  own. If you have very bad symptoms or other problems, you may be treated in a hospital. Follow these instructions at home:     Activity  Rest as needed. Get plenty of sleep.  Stay home from work or school as told by your doctor. ? Do not leave home until you do not have a fever for 24 hours without taking medicine. ? Leave home only to visit your doctor. Eating and drinking  Take an ORS (oral rehydration solution). This is a drink that is sold at pharmacies and stores.  Drink enough fluid to keep your pee (urine) pale yellow.  Drink clear fluids in small amounts as you are able. Clear fluids  include: ? Water. ? Ice chips. ? Fruit juice that has water added (diluted fruit juice). ? Low-calorie sports drinks.  Eat bland, easy-to-digest foods in small amounts as you are able. These foods include: ? Bananas. ? Applesauce. ? Rice. ? Lean meats. ? Toast. ? Crackers.  Do not eat or drink: ? Fluids that have a lot of sugar or caffeine. ? Alcohol. ? Spicy or fatty foods. General instructions  Take over-the-counter and prescription medicines only as told by your doctor.  Use a cool mist humidifier to add moisture to the air in your home. This can make it easier for you to breathe.  Cover your mouth and nose when you cough or sneeze.  Wash your hands with soap and water often, especially after you cough or sneeze. If you cannot use soap and water, use alcohol-based hand sanitizer.  Keep all follow-up visits as told by your doctor. This is important. How is this prevented?   Get a flu shot every year. You may get the flu shot in late summer, fall, or winter. Ask your doctor when you should get your flu shot.  Avoid contact with people who are sick during fall and winter (cold and flu season). Contact a doctor if:  You get new symptoms.  You have: ? Chest pain. ? Watery poop (diarrhea). ? A fever.  Your cough gets worse.  You start to have more mucus.  You feel sick to your stomach.  You throw up. Get help right away if you:  Have shortness of breath.  Have trouble breathing.  Have skin or nails that turn a bluish color.  Have very bad pain or stiffness in your neck.  Get a sudden headache.  Get sudden pain in your face or ear.  Cannot eat or drink without throwing up. Summary  Influenza ("the flu") is an infection in the lungs, nose, and throat. It is caused by a virus.  Take over-the-counter and prescription medicines only as told by your doctor.  Getting a flu shot every year is the best way to avoid getting the flu. This information is not  intended to replace advice given to you by your health care provider. Make sure you discuss any questions you have with your health care provider. Document Released: 02/28/2008 Document Revised: 11/06/2017 Document Reviewed: 11/06/2017 Elsevier Interactive Patient Education  2019 [employer].

## 2018-07-23 NOTE — Progress Notes (Signed)
Patient ID: Kimberly Torres DOB: 30-Aug-1980 AGE: 38 y.o. MRN: [id number]   PCP: Abner Greenspan, MD   Chief Complaint:  Chief Complaint  Patient presents with  . Save- sore throat, fever, chills,body aches     Subjective:    HPI:  Kimberly Torres is a 38 y.o. female presents for evaluation  Chief Complaint  Patient presents with  . Save- sore throat, fever, chills,body aches   38 year old female presents to [facility] with two day history of flu like symptoms. Began with post-nasal drip and sore throat. Few hours later developed diffuse headaches. Yesterday evening developed fever. Tmax 101F. Has been taking OTC Tylenol, ibuprofen (last dose 1 hour ago), Sudafed, and Delsym with minimal relief. Associated nasal congestion, headache, and cough. Cough coarse, junky and productive. Denies dizziness/lightheadedness, ear pain, sinus pain, chest pain, SOB, wheezing, nausea/vomiting, abdominal pain, diarrhea, rash.  Patient did receive this season's influenza vaccination. Patient works in health care, multiple sick exposures. No known specific flu contact.  Patient with no asthma, COPD, or emphysema diagnosis. Has had bronchitis previously; prescribed inhaler, given nebulizer treatment. No smoking history. Mild seasonal allergies; has Flonase nasal spray at home.    A limited review of symptoms was performed, pertinent positives and negatives as mentioned in HPI.  The following portions of the patient's history were reviewed and updated as appropriate: allergies, current medications and past medical history.  Patient Active Problem List   Diagnosis Date Noted  . Obesity (BMI 30-39.9) 05/12/2017  . 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 HNP (herniated nucleus pulposus), lumbar 09/28/2013  . 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 Cluster headache 02/15/2012  . Routine general medical examination at a health care facility 01/28/2012   . Sinus tachycardia 09/11/2010  . HEMANGIOMA, HEPATIC 03/14/2009  . Attention deficit hyperactivity disorder (ADHD) 06/25/2007  . SCOLIOSIS 06/25/2007    Allergies  Allergen Reactions  . Shellfish Allergy     Questionable - did have rash once     Current Outpatient Medications on File Prior to Visit  Medication Sig Dispense Refill  . fluticasone (FLONASE) 50 MCG/ACT nasal spray Place 2 sprays into both nostrils daily as needed for allergies.     . folic acid (FOLVITE) 1 MG tablet Take 1 mg by mouth daily.    . hydrOXYzine (ATARAX/VISTARIL) 50 MG tablet Take 50-100 mg by mouth at bedtime as needed.    Marland Kitchen ibuprofen (ADVIL,MOTRIN) 600 MG tablet Take by mouth.    . Melatonin 3 MG CAPS Take 2 capsules by mouth at bedtime.    . Norethindrone-Ethinyl Estradiol-Fe Biphas (LO LOESTRIN FE) 1 MG-10 MCG / 10 MCG tablet Take 1 tablet by mouth daily.    . Omega-3 Fatty Acids (FISH OIL) 1000 MG CAPS Take 2 capsules by mouth daily.    Marland Kitchen spironolactone (ALDACTONE) 50 MG tablet     . tretinoin (RETIN-A) 0.025 % cream      No current facility-administered medications on file prior to visit.        Objective:   Vitals:   07/23/18 0907  BP: 138/86  Pulse: 98  Resp: 20  Temp: 98.3 F (36.8 C)  SpO2: 99%     Wt Readings from Last 3 Encounters:  07/23/18 192 lb (87.1 kg)  07/22/18 214 lb 6 oz (97.2 kg)  02/07/18 180 lb (81.6 kg)    Physical Exam:   General Appearance:  Patient sitting comfortably on examination table. Conversational. Kermit Balo self-historian. In no acute  distress. Afebrile.   Head:  Normocephalic, without obvious abnormality, atraumatic  Eyes:  PERRL, conjunctiva/corneas clear, EOM's intact  Ears:  Bilateral ear canals WNL. No erythema or edema. No discharge/drainage. Bilateral TMs WNL. No erythema, injection, or serous effusion. Scar tissue, left more prominent than right, from previous tympanostomy procedure.  Nose: Nares normal, septum midline. Nasal mucosa with bilateral  edema and thick clear/pale yellow discharge. Nasally sounding voice. No sinus tenderness with percussion/palpation.  Throat: Lips, mucosa, and tongue normal; teeth and gums normal. Throat reveals no erythema. Tonsils with no enlargement or exudate.  Neck: Supple, symmetrical, trachea midline, no adenopathy  Lungs:   Clear to auscultation bilaterally, respirations unlabored. Good aeration. No rales, rhonchi, crackles, or wheezing. No cough elicited with deep inspiration or forced expiration. 99% pulse ox.  Heart:  Regular rate and rhythm, S1 and S2 normal, no murmur, rub, or gallop  Extremities: Extremities normal, atraumatic, no cyanosis or edema  Pulses: 2+ and symmetric  Skin: Skin color, texture, turgor normal, no rashes or lesions  Lymph nodes: Cervical, supraclavicular, and axillary nodes normal  Neurologic: Normal    Assessment & Plan:    Exam findings, diagnosis etiology and medication use and indications reviewed with patient. Follow-Up and discharge instructions provided. No emergent/urgent issues found on exam.  Patient education was provided.   Patient verbalized understanding of information provided and agrees with plan of care (POC), all questions answered. The patient is advised to call or return to clinic if condition does not see an improvement in symptoms, or to seek the care of the closest emergency department if condition worsens with the below plan.    1. Influenza - oseltamivir (TAMIFLU) 75 MG capsule; Take 1 capsule (75 mg total) by mouth 2 (two) times daily for 5 days.  Dispense: 10 capsule; Refill: 0 - benzonatate (TESSALON) 100 MG capsule; Take 1 capsule (100 mg total) by mouth 2 (two) times daily as needed for cough.  Dispense: 20 capsule; Refill: 0  Patient with 2 day history (~24 hour history) of flu-like symptoms. Began with PND and sore throat. Quickly developed fever, chills, sweats, bodyaches, nasal congestion, and cough. 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 Patient generally healthy. Today VSS, afebrile, in no acute distress, clear lung sounds. Believe patient can be empirically diagnosed with influenza (offered rapid flu test, patient agreed with provider suggestion, declined test). Uncomplicated. Within 48 hour symptom onset window, prescribed antiviral, Tamiflu (at patient request, due to cost, compared to [place]). Advised rest, increase fluids, OTC medication for symptom relief. Discussed contagiousness; provided work excuse note. Discussed possible flu complications; advised patient f/u with PCP or urgent care in 4-5 days if symptoms not improving, sooner with any worsening symptoms. Patient agreed with plan.   Darlin Priestly, MHS, PA-C Montey Hora, MHS, PA-C Advanced Practice Provider [facility]  [address], [facility], [place], [place] [address] (p):  [phone] [email or web address]

## 2018-07-25 ENCOUNTER — Telehealth: Payer: Self-pay | Admitting: Emergency Medicine

## 2018-07-25 NOTE — Telephone Encounter (Signed)
Left message following up on visit with [facility] 

## 2018-07-29 DIAGNOSIS — Z6832 Body mass index (BMI) 32.0-32.9, adult: Secondary | ICD-10-CM | POA: Diagnosis not present

## 2018-07-29 DIAGNOSIS — Z113 Encounter for screening for infections with a predominantly sexual mode of transmission: Secondary | ICD-10-CM | POA: Diagnosis not present

## 2018-07-29 DIAGNOSIS — Z114 Encounter for screening for human immunodeficiency virus [HIV]: Secondary | ICD-10-CM | POA: Diagnosis not present

## 2018-07-29 DIAGNOSIS — Z1159 Encounter for screening for other viral diseases: Secondary | ICD-10-CM | POA: Diagnosis not present

## 2018-07-29 DIAGNOSIS — Z01419 Encounter for gynecological examination (general) (routine) without abnormal findings: Secondary | ICD-10-CM | POA: Diagnosis not present

## 2018-07-29 DIAGNOSIS — Z118 Encounter for screening for other infectious and parasitic diseases: Secondary | ICD-10-CM | POA: Diagnosis not present

## 2018-08-21 DIAGNOSIS — E669 Obesity, unspecified: Secondary | ICD-10-CM | POA: Diagnosis not present

## 2018-08-21 DIAGNOSIS — R194 Change in bowel habit: Secondary | ICD-10-CM | POA: Diagnosis not present

## 2018-08-21 DIAGNOSIS — R152 Fecal urgency: Secondary | ICD-10-CM | POA: Diagnosis not present

## 2018-08-21 DIAGNOSIS — R140 Abdominal distension (gaseous): Secondary | ICD-10-CM | POA: Diagnosis not present

## 2018-08-21 DIAGNOSIS — Z1211 Encounter for screening for malignant neoplasm of colon: Secondary | ICD-10-CM | POA: Diagnosis not present

## 2018-08-25 DIAGNOSIS — R3915 Urgency of urination: Secondary | ICD-10-CM | POA: Diagnosis not present

## 2018-08-25 DIAGNOSIS — R350 Frequency of micturition: Secondary | ICD-10-CM | POA: Diagnosis not present

## 2018-09-18 ENCOUNTER — Encounter: Payer: Self-pay | Admitting: Family Medicine

## 2018-09-18 ENCOUNTER — Ambulatory Visit (INDEPENDENT_AMBULATORY_CARE_PROVIDER_SITE_OTHER): Payer: 59 | Admitting: Family Medicine

## 2018-09-18 DIAGNOSIS — R000 Tachycardia, unspecified: Secondary | ICD-10-CM | POA: Diagnosis not present

## 2018-09-18 DIAGNOSIS — R030 Elevated blood-pressure reading, without diagnosis of hypertension: Secondary | ICD-10-CM | POA: Diagnosis not present

## 2018-09-18 MED ORDER — METOPROLOL TARTRATE 25 MG PO TABS: 25.0000 mg | ORAL_TABLET | Freq: Two times a day (BID) | ORAL | 11 refills | Status: DC

## 2018-09-18 NOTE — Assessment & Plan Note (Signed)
Possible early essential hypertension with family hx  Also on OC- (not new)  bp has been elevated and she notices increase in headaches as well  Stressors may add to this  Good health habits overall as well  Will try low dose /short acting metoprolol (also in setting of sinus tachycardia) Disc possible side effects and will watch for dizziness or other issues  Plan f/u in 2 mo (she can check bp at the hospital)  Meds ordered this encounter  Medications  . metoprolol tartrate (LOPRESSOR) 25 MG tablet    Sig: Take 1 tablet (25 mg total) by mouth 2 (two) times daily.    Dispense:  60 tablet    Refill:  11

## 2018-09-18 NOTE — Progress Notes (Signed)
Virtual Visit via Video Note  I connected with Kimberly Torres on 09/18/18 at 11:00 AM EDT by a video enabled telemedicine application and verified that I am speaking with the correct person using two identifiers.  the patient is at work today  I am in my office  I discussed the limitations of evaluation and management by telemedicine and the availability of in person appointments. The patient expressed understanding and agreed to proceed.  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 Why   Here BP Readings from Last 3 Encounters:  07/23/18 138/86  07/22/18 118/76  02/07/18 136/88   Pulse Readings from Last 3 Encounters:  07/23/18 98  07/22/18 83  02/07/18 78    Last labs Lab Results  Component Value Date   CREATININE 0.88 07/18/2018   BUN 8 07/18/2018   NA 138 07/18/2018   K 4.3 07/18/2018   CL 106 07/18/2018   CO2 25 07/18/2018   Lab Results  Component Value Date   CHOL 143 07/18/2018   HDL 54.80 07/18/2018   LDLCALC 72 07/18/2018   TRIG 83.0 07/18/2018   CHOLHDL 3 07/18/2018     Review of Systems  Constitutional: Negative for chills, diaphoresis, fever, malaise/fatigue and weight loss.  Eyes: Negative for blurred vision.  Respiratory: Negative for cough and wheezing.   Cardiovascular: Negative for chest pain, palpitations, leg swelling and PND.  Gastrointestinal:  Negative for abdominal pain and nausea.  Musculoskeletal: Positive for neck pain.  Skin: Negative for rash.  Neurological: Positive for headaches. Negative for dizziness, sensory change, focal weakness and weakness.  Psychiatric/Behavioral: The patient is nervous/anxious.        Patient Active Problem List   Diagnosis Date Noted  . Elevated blood pressure reading 09/18/2018  . Obesity (BMI 30-39.9) 05/12/2017  . 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 HNP (herniated nucleus pulposus), lumbar 09/28/2013  . 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 Cluster headache 02/15/2012  . Routine general medical examination at a health care facility 01/28/2012  . Sinus tachycardia 09/11/2010  . HEMANGIOMA, HEPATIC 03/14/2009  . Attention deficit hyperactivity disorder (ADHD) 06/25/2007  . SCOLIOSIS 06/25/2007   Past Medical History:  Diagnosis Date  . Acne    sees derm- on accutane  . Anxiety   . Chondromalacia of right knee    right  . Depression   . Dysrhythmia    hx SVT  . H/O seasonal allergies   . Headache(784.0)    cluster migraines  . Insomnia   . Sinus tachycardia    eval by Dr. Stanford Breed in 2007: monior revealed sinus tach. vs EAT; Echo 10/07: EF 70%  . Varicose veins    eval. by vascular surgeon and procedure scheduled to correct   Past Surgical History:  Procedure Laterality Date  . CESAREAN SECTION  2009  . DIAGNOSTIC LAPAROSCOPY  2010   scar tissue  . KNEE SURGERY  04/10/2010  . LUMBAR LAMINECTOMY/DECOMPRESSION MICRODISCECTOMY Right 09/28/2013   Procedure: LUMBAR LAMINECTOMY/DECOMPRESSION MICRODISCECTOMY 1 LEVEL,LUMBAR  THREE-FOUR RIGHT ;  Surgeon: Charlie Pitter, MD;  Location: [facility];  Service: Neurosurgery;  Laterality: Right;  right  . MYRINGOTOMY     Social History   Tobacco Use  . Smoking status: Never Smoker  . Smokeless tobacco: Never Used  Substance Use Topics  . Alcohol use: Yes    Alcohol/week: 0.0 standard drinks    Comment:  occasional to rare  . Drug use: No   Family History  Problem Relation Age of Onset  . Hypertension Mother   . ADD / ADHD Mother   . Coronary artery disease Other        grandfather  . Heart attack Other        grandfather  . Breast cancer Other        grandmother  . Bipolar disorder Other        grandmother  . ADD / ADHD Sister   . COPD Maternal Grandfather    Allergies  Allergen Reactions  . Shellfish Allergy     Questionable - did have rash once    Current Outpatient Medications on File Prior to Visit  Medication Sig Dispense Refill  . folic acid (FOLVITE) 1 MG tablet Take 1 mg by mouth daily.    . hydrOXYzine (ATARAX/VISTARIL) 50 MG tablet Take 50-100 mg by mouth at bedtime as needed.    Marland Kitchen ibuprofen (ADVIL,MOTRIN) 600 MG tablet Take by mouth.    . Melatonin 3 MG CAPS Take 2 capsules by mouth daily as needed.     . Norethindrone-Ethinyl Estradiol-Fe Biphas (LO LOESTRIN FE) 1 MG-10 MCG / 10 MCG tablet Take 1 tablet by mouth daily.    . Omega-3 Fatty Acids (FISH OIL) 1000 MG CAPS Take 2 capsules by mouth daily.    . Probiotic Product (CULTURELLE PROBIOTICS PO) Take 1 tablet by mouth daily.    Marland Kitchen tretinoin (RETIN-A) 0.025 % cream Apply topically 3 (three) times a week.     . fluticasone (FLONASE) 50 MCG/ACT nasal spray Place 2 sprays into both nostrils daily as needed for allergies.      No current facility-administered medications on file prior to visit.     Observations/Objective: Patient appears well Talkative and pleasant  Slightly anxious but not depressed  Voices moderate L sided headache today  No facial asymmetry or slurred speech  Assessment and Plan: Problem List Items Addressed This Visit      Other   Sinus tachycardia    Now with some inc bp  Will try metoprolol 25 tartrate bid  Alert if side eff She can monitor bp and pulse at work/hospital F/u planned for 2 wk      Elevated blood pressure reading - Primary    Possible early essential hypertension  with family hx  Also on OC- (not new)  bp has been elevated and she notices increase in headaches as well  Stressors may add to this  Good health habits overall as well  Will try low dose /short acting metoprolol (also in setting of sinus tachycardia) Disc possible side effects and will watch for dizziness or other issues  Plan f/u in 2 mo (she can check bp at the hospital)  Meds ordered this encounter  Medications  . metoprolol tartrate (LOPRESSOR) 25 MG tablet    Sig: Take 1  tablet (25 mg total) by mouth 2 (two) times daily.    Dispense:  60 tablet    Refill:  11             Follow Up Instructions: Start metoprolol 25 mg twice daily  If side effects or low bp-please alert me  Watch sodium in diet  Exercise  We will schedule f/u in 2 wk    I discussed the assessment and treatment plan with the patient. The patient was provided an opportunity to ask questions and all were answered. The patient agreed with the plan and demonstrated an understanding of the instructions.   The patient was advised to call back or seek an in-person evaluation if the symptoms worsen or if the condition fails to improve as anticipated.    Loura Pardon, MD

## 2018-09-18 NOTE — Assessment & Plan Note (Signed)
Now with some inc bp  Will try metoprolol 25 tartrate bid  Alert if side eff She can monitor bp and pulse at work/hospital F/u planned for 2 wk

## 2018-10-06 ENCOUNTER — Encounter: Payer: Self-pay | Admitting: Family Medicine

## 2018-10-06 ENCOUNTER — Ambulatory Visit (INDEPENDENT_AMBULATORY_CARE_PROVIDER_SITE_OTHER): Payer: 59 | Admitting: Family Medicine

## 2018-10-06 DIAGNOSIS — R030 Elevated blood-pressure reading, without diagnosis of hypertension: Secondary | ICD-10-CM

## 2018-10-06 DIAGNOSIS — R000 Tachycardia, unspecified: Secondary | ICD-10-CM

## 2018-10-06 NOTE — Patient Instructions (Signed)
I'm glad you are doing better  Keep the metoprolol on hand- you can take it as needed for headache or elevated blood pressure/pulse with caution Continue good self care   Let us know if you need anything

## 2018-10-06 NOTE — Progress Notes (Signed)
Virtual Visit via Video Note  I connected with Kimberly Torres on 10/06/18 at  2:00 PM EDT by a video enabled telemedicine application and verified that I am speaking with the correct person using two identifiers.  Location: Patient: home Provider: office    I discussed the limitations of evaluation and management by telemedicine and the availability of in person appointments. The patient expressed understanding and agreed to proceed.  History of Present Illness: Pt presents for f/u of elevated bp and pulse   Last seen 4/16  Started on metoprolol tartrate 25 mg bid   Her bp went back to normal on its own (thinks the headache pain caused the increased bp and not the other way around)  It became low on metoprolol -so held it (99/78)  Highest diastolic 82  Pulse is fine in the 80s   112/78 2  Hours ago Pulse was 88  No change in weight    Review of Systems  Constitutional: Negative for chills, fever and malaise/fatigue.  HENT: Negative for congestion.   Eyes: Negative for blurred vision.  Respiratory: Negative for cough and shortness of breath.   Cardiovascular: Negative for chest pain, palpitations and leg swelling.  Gastrointestinal: Negative for heartburn and nausea.  Musculoskeletal: Negative for myalgias and neck pain.  Skin: Negative for rash.  Neurological: Negative for dizziness, tingling and headaches.       Headache is resolved  Psychiatric/Behavioral:       Stressors-working in healthcare      Patient Active Problem List   Diagnosis Date Noted  . Elevated blood pressure reading 09/18/2018  . Obesity (BMI 30-39.9) 05/12/2017  . 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 HNP (herniated nucleus pulposus), lumbar 09/28/2013  . 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 Cluster headache 02/15/2012  . Routine general medical examination at a health care facility 01/28/2012  . Sinus tachycardia 09/11/2010  . HEMANGIOMA, HEPATIC 03/14/2009   . Attention deficit hyperactivity disorder (ADHD) 06/25/2007  . SCOLIOSIS 06/25/2007   Past Medical History:  Diagnosis Date  . Acne    sees derm- on accutane  . Anxiety   . Chondromalacia of right knee    right  . Depression   . Dysrhythmia    hx SVT  . H/O seasonal allergies   . Headache(784.0)    cluster migraines  . Insomnia   . Sinus tachycardia    eval by Dr. Stanford Breed in 2007: monior revealed sinus tach. vs EAT; Echo 10/07: EF 70%  . Varicose veins    eval. by vascular surgeon and procedure scheduled to correct   Past Surgical History:  Procedure Laterality Date  . CESAREAN SECTION  2009  . DIAGNOSTIC LAPAROSCOPY  2010   scar tissue  . KNEE SURGERY  04/10/2010  . LUMBAR LAMINECTOMY/DECOMPRESSION MICRODISCECTOMY Right 09/28/2013   Procedure: LUMBAR LAMINECTOMY/DECOMPRESSION MICRODISCECTOMY 1 LEVEL,LUMBAR  THREE-FOUR RIGHT ;  Surgeon: Charlie Pitter, MD;  Location: [facility];  Service: Neurosurgery;  Laterality: Right;  right  . MYRINGOTOMY     Social History   Tobacco Use  . Smoking status: Never Smoker  . Smokeless tobacco: Never Used  Substance Use Topics  . Alcohol use: Yes    Alcohol/week: 0.0 standard drinks    Comment: occasional to rare  . Drug use: No   Family History  Problem Relation Age of Onset  . Hypertension Mother   . ADD / ADHD Mother   . Coronary artery disease Other  grandfather  . Heart attack Other        grandfather  . Breast cancer Other        grandmother  . Bipolar disorder Other        grandmother  . ADD / ADHD Sister   . COPD Maternal Grandfather    Allergies  Allergen Reactions  . Shellfish Allergy     Questionable - did have rash once    Current Outpatient Medications on File Prior to Visit  Medication Sig Dispense Refill  . fluticasone (FLONASE) 50 MCG/ACT nasal spray Place 2 sprays into both nostrils daily as needed for allergies.     . folic acid (FOLVITE) 1 MG tablet Take 1 mg by mouth daily.    .  hydrOXYzine (ATARAX/VISTARIL) 50 MG tablet Take 50-100 mg by mouth at bedtime as needed.    Marland Kitchen ibuprofen (ADVIL,MOTRIN) 600 MG tablet Take by mouth.    . Melatonin 3 MG CAPS Take 2 capsules by mouth daily as needed.     . metoprolol tartrate (LOPRESSOR) 25 MG tablet Take 1 tablet (25 mg total) by mouth 2 (two) times daily. (Patient taking differently: Take 25 mg by mouth 2 (two) times daily. As needed for elevated blood pressure, headache or high pulse rate) 60 tablet 11  . Norethindrone-Ethinyl Estradiol-Fe Biphas (LO LOESTRIN FE) 1 MG-10 MCG / 10 MCG tablet Take 1 tablet by mouth daily.    . Omega-3 Fatty Acids (FISH OIL) 1000 MG CAPS Take 2 capsules by mouth daily.    . Probiotic Product (CULTURELLE PROBIOTICS PO) Take 1 tablet by mouth daily.    Marland Kitchen tretinoin (RETIN-A) 0.025 % cream Apply topically 3 (three) times a week.      No current facility-administered medications on file prior to visit.     Observations/Objective: Pt appears well /like her normal self  Cheerful/talkative and mentally sharp No facial swelling or asymmetry Not hoarse  No sob or cough during interview Nl skin tone-no rash or flushing   Assessment and Plan: Problem List Items Addressed This Visit      Other   Sinus tachycardia    This comes and goes Improved with metoprolol if needed  Pain and stress worsen it      Elevated blood pressure reading    In retrospect due to pain of a headache  Metoprolol worked well- but pt developed hypotension with daily use  Will plan to take metoprolol as needed for bp/pulse high or headache  Encouraged healthy habits and to reach out if any problems             Follow Up Instructions: I'm glad you are doing better  Keep the metoprolol on hand- you can take it as needed for headache or elevated blood pressure/pulse with caution Continue good self care   Let us know if you need anything    I discussed the assessment and treatment plan with the patient. The patient  was provided an opportunity to ask questions and all were answered. The patient agreed with the plan and demonstrated an understanding of the instructions.   The patient was advised to call back or seek an in-person evaluation if the symptoms worsen or if the condition fails to improve as anticipated.    Loura Pardon, MD

## 2018-10-06 NOTE — Assessment & Plan Note (Signed)
In retrospect due to pain of a headache  Metoprolol worked well- but pt developed hypotension with daily use  Will plan to take metoprolol as needed for bp/pulse high or headache  Encouraged healthy habits and to reach out if any problems

## 2018-10-06 NOTE — Assessment & Plan Note (Signed)
This comes and goes Improved with metoprolol if needed  Pain and stress worsen it

## 2018-10-07 DIAGNOSIS — R3915 Urgency of urination: Secondary | ICD-10-CM | POA: Diagnosis not present

## 2018-10-07 MED FILL — XIFAXAN 550 MG TABLET: 550 | 14 days supply | Qty: 42 | Fill #0

## 2019-01-21 DIAGNOSIS — R194 Change in bowel habit: Secondary | ICD-10-CM | POA: Diagnosis not present

## 2019-01-21 DIAGNOSIS — Z1211 Encounter for screening for malignant neoplasm of colon: Secondary | ICD-10-CM | POA: Diagnosis not present

## 2019-03-10 DIAGNOSIS — L700 Acne vulgaris: Secondary | ICD-10-CM | POA: Diagnosis not present

## 2019-03-10 DIAGNOSIS — D225 Melanocytic nevi of trunk: Secondary | ICD-10-CM | POA: Diagnosis not present

## 2019-03-10 DIAGNOSIS — D2271 Melanocytic nevi of right lower limb, including hip: Secondary | ICD-10-CM | POA: Diagnosis not present

## 2019-03-10 DIAGNOSIS — D2261 Melanocytic nevi of right upper limb, including shoulder: Secondary | ICD-10-CM | POA: Diagnosis not present

## 2019-03-10 DIAGNOSIS — D2262 Melanocytic nevi of left upper limb, including shoulder: Secondary | ICD-10-CM | POA: Diagnosis not present

## 2019-03-10 DIAGNOSIS — D2272 Melanocytic nevi of left lower limb, including hip: Secondary | ICD-10-CM | POA: Diagnosis not present

## 2019-03-19 DIAGNOSIS — M79641 Pain in right hand: Secondary | ICD-10-CM | POA: Diagnosis not present

## 2019-03-19 DIAGNOSIS — M65341 Trigger finger, right ring finger: Secondary | ICD-10-CM | POA: Diagnosis not present

## 2019-03-24 ENCOUNTER — Other Ambulatory Visit: Payer: Self-pay | Admitting: *Deleted

## 2019-04-06 ENCOUNTER — Other Ambulatory Visit: Payer: Self-pay | Admitting: Family Medicine

## 2019-04-06 DIAGNOSIS — Z1231 Encounter for screening mammogram for malignant neoplasm of breast: Secondary | ICD-10-CM

## 2019-04-15 DIAGNOSIS — H5203 Hypermetropia, bilateral: Secondary | ICD-10-CM | POA: Diagnosis not present

## 2019-04-16 DIAGNOSIS — M65341 Trigger finger, right ring finger: Secondary | ICD-10-CM | POA: Diagnosis not present

## 2019-05-20 ENCOUNTER — Encounter: Payer: Self-pay | Admitting: Family Medicine

## 2019-05-20 DIAGNOSIS — M65341 Trigger finger, right ring finger: Secondary | ICD-10-CM | POA: Diagnosis not present

## 2019-05-20 DIAGNOSIS — Z4789 Encounter for other orthopedic aftercare: Secondary | ICD-10-CM | POA: Diagnosis not present

## 2019-05-20 MED ORDER — HYDROXYZINE HCL 50 MG PO TABS: 50.0000 mg | ORAL_TABLET | Freq: Every evening | ORAL | 1 refills | Status: DC | PRN

## 2019-05-20 MED FILL — HYDROCODON-APAP 5-325: 5-325 | 5 days supply | Qty: 40 | Fill #0

## 2019-06-01 ENCOUNTER — Emergency Department
Admission: EM | Admit: 2019-06-01 | Discharge: 2019-06-01 | Disposition: A | Discharge status: Home / Self Care | Payer: PRIVATE HEALTH INSURANCE | Attending: Student in an Organized Health Care Education/Training Program | Admitting: Student in an Organized Health Care Education/Training Program

## 2019-06-01 ENCOUNTER — Other Ambulatory Visit: Payer: Self-pay

## 2019-06-01 ENCOUNTER — Encounter: Payer: Self-pay | Admitting: Emergency Medicine

## 2019-06-01 DIAGNOSIS — T7840XA Allergy, unspecified, initial encounter: Secondary | ICD-10-CM | POA: Insufficient documentation

## 2019-06-01 DIAGNOSIS — R002 Palpitations: Secondary | ICD-10-CM | POA: Insufficient documentation

## 2019-06-01 DIAGNOSIS — Z79899 Other long term (current) drug therapy: Secondary | ICD-10-CM | POA: Diagnosis not present

## 2019-06-01 MED ORDER — EPINEPHRINE 0.3 MG/0.3ML IJ SOAJ
0.3000 mg | Freq: Once | INTRAMUSCULAR | Status: AC
  Administered 2019-06-01: 0.3 mg via INTRAMUSCULAR
  Filled 2019-06-01: qty 0.3

## 2019-06-01 MED ORDER — PREDNISONE 20 MG PO TABS
60.0000 mg | ORAL_TABLET | Freq: Once | ORAL | Status: AC
  Administered 2019-06-01: 60 mg via ORAL
  Filled 2019-06-01: qty 3

## 2019-06-01 MED ORDER — EPINEPHRINE 0.3 MG/0.3ML IJ SOAJ: 0.3000 mg | INTRAMUSCULAR | 1 refills | Status: DC | PRN

## 2019-06-01 MED ORDER — PREDNISONE 20 MG PO TABS: 40.0000 mg | ORAL_TABLET | Freq: Every day | ORAL | 0 refills | Status: AC

## 2019-06-01 MED ORDER — DIPHENHYDRAMINE HCL 25 MG PO CAPS
25.0000 mg | ORAL_CAPSULE | Freq: Once | ORAL | Status: AC
  Administered 2019-06-01: 25 mg via ORAL
  Filled 2019-06-01: qty 1

## 2019-06-01 NOTE — ED Triage Notes (Signed)
Pt here with c/o possible reaction post Covid vaccine this am, states she waited 20 minutes after shot and was cleared to leave, then while driving, began feeling shob and light headed, tingling and shaky. Pt encouraged to slow her breathing in triage, hyperventilating upon initial assessment. Denies allergic reactions to any previous vaccines.

## 2019-06-01 NOTE — ED Provider Notes (Signed)
[facility] Emergency Department Provider Note    First MD Initiated Contact with Patient 06/01/19 1554     (approximate)  I have reviewed the triage vital signs and the nursing notes.   HISTORY  Chief Complaint Allergic Reaction    HPI Kimberly Torres is a 38 y.o. female   with the below listed past medical history presents to the ER for evaluation of palpitations scratchiness in her throat shortness of breath sweatiness that occurred roughly 30 minutes after receiving her first Covid vaccine injection today.  Does not feel like is becoming rapidly worse but has not gotten much better.  Denies any nausea or vomiting.  No history of allergic reaction or anaphylaxis previously.   Past Medical History:  Diagnosis Date  . Acne    sees derm- on accutane  . Anxiety   . Chondromalacia of right knee    right  . Depression   . Dysrhythmia    hx SVT  . H/O seasonal allergies   . Headache(784.0)    cluster migraines  . Insomnia   . Sinus tachycardia    eval by Dr. Stanford Breed in 2007: monior revealed sinus tach. vs EAT; Echo 10/07: EF 70%  . Varicose veins    eval. by vascular surgeon and procedure scheduled to correct   Family History  Problem Relation Age of Onset  . Hypertension Mother   . ADD / ADHD Mother   . Coronary artery disease Other        grandfather  . Heart attack Other        grandfather  . Breast cancer Other        grandmother  . Bipolar disorder Other        grandmother  . ADD / ADHD Sister   . COPD Maternal Grandfather    Past Surgical History:  Procedure Laterality Date  . CESAREAN SECTION  2009  . DIAGNOSTIC LAPAROSCOPY  2010   scar tissue  . KNEE SURGERY  04/10/2010  . LUMBAR LAMINECTOMY/DECOMPRESSION MICRODISCECTOMY Right 09/28/2013   Procedure: LUMBAR LAMINECTOMY/DECOMPRESSION MICRODISCECTOMY 1 LEVEL,LUMBAR  THREE-FOUR RIGHT ;  Surgeon: Charlie Pitter, MD;  Location: [facility];  Service: Neurosurgery;   Laterality: Right;  right  . MYRINGOTOMY     Patient Active Problem List   Diagnosis Date Noted  . Elevated blood pressure reading 09/18/2018  . Obesity (BMI 30-39.9) 05/12/2017  . 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 HNP (herniated nucleus pulposus), lumbar 09/28/2013  . 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 Cluster headache 02/15/2012  . Routine general medical examination at a health care facility 01/28/2012  . Sinus tachycardia 09/11/2010  . HEMANGIOMA, HEPATIC 03/14/2009  . Attention deficit hyperactivity disorder (ADHD) 06/25/2007  . SCOLIOSIS 06/25/2007      Prior to Admission medications   Medication Sig Start Date End Date Taking? Authorizing Provider  EPINEPHrine 0.3 mg/0.3 mL IJ SOAJ injection Inject 0.3 mLs (0.3 mg total) into the muscle as needed for anaphylaxis. 06/01/19   Merlyn Lot, MD  fluticasone [facility]) 50 MCG/ACT nasal spray Place 2 sprays into both nostrils daily as needed for allergies.     [provider]  folic acid (FOLVITE) 1 MG tablet Take 1 mg by mouth daily.    [provider]  hydrOXYzine (ATARAX/VISTARIL) 50 MG tablet Take 1-2 tablets (50-100 mg total) by mouth at bedtime as needed. 05/20/19   Tower, Wynelle Fanny, MD  ibuprofen (ADVIL,MOTRIN) 600 MG tablet Take by mouth.  [provider]  Melatonin 3 MG CAPS Take 2 capsules by mouth daily as needed.     [provider]  metoprolol tartrate (LOPRESSOR) 25 MG tablet Take 1 tablet (25 mg total) by mouth 2 (two) times daily. Patient taking differently: Take 25 mg by mouth 2 (two) times daily. As needed for elevated blood pressure, headache or high pulse rate 09/18/18   Tower, Wynelle Fanny, MD  Norethindrone-Ethinyl Estradiol-Fe Biphas (LO LOESTRIN FE) 1 MG-10 MCG / 10 MCG tablet Take 1 tablet by mouth daily.    [provider]  Omega-3 Fatty Acids (FISH OIL) 1000 MG CAPS Take 2 capsules by mouth daily. 02/02/17   [provider]    predniSONE (DELTASONE) 20 MG tablet Take 2 tablets (40 mg total) by mouth daily for 5 days. 06/01/19 06/06/19  Merlyn Lot, MD  Probiotic Product (CULTURELLE PROBIOTICS PO) Take 1 tablet by mouth daily.    [provider]  tretinoin (RETIN-A) 0.025 % cream Apply topically 3 (three) times a week.  03/11/18   [provider]    Allergies Shellfish allergy    Social History Social History   Tobacco Use  . Smoking status: Never Smoker  . Smokeless tobacco: Never Used  Substance Use Topics  . Alcohol use: Yes    Alcohol/week: 0.0 standard drinks    Comment: occasional to rare  . Drug use: No    Review of Systems Patient denies headaches, rhinorrhea, blurry vision, numbness, shortness of breath, chest pain, edema, cough, abdominal pain, nausea, vomiting, diarrhea, dysuria, fevers, rashes or hallucinations unless otherwise stated above in HPI. ____________________________________________   PHYSICAL EXAM:  VITAL SIGNS: Vitals:   06/01/19 1800 06/01/19 1815  BP: 132/75   Pulse: (!) 115 (!) 116  Resp: 18 17  Temp:    SpO2: 98% 97%    Constitutional: Alert and oriented.  Eyes: Conjunctivae are normal.  Head: Atraumatic. Nose: No congestion/rhinnorhea. Mouth/Throat: Mucous membranes are moist.  Trace uvular edema and left peritonsillar edema, no exudates, trismus Neck: No stridor. Painless ROM.  Cardiovascular: mildly tachycardic rate, regular rhythm. Grossly normal heart sounds.  Good peripheral circulation. Respiratory: Normal respiratory effort.  No retractions. Lungs CTAB. Gastrointestinal: Soft and nontender. No distention. No abdominal bruits. No CVA tenderness. Genitourinary:  Musculoskeletal: No lower extremity tenderness nor edema.  No joint effusions. Neurologic:  Normal speech and language. No gross focal neurologic deficits are appreciated. No facial droop Skin:  Skin is warm, dry and intact. No rash noted. Psychiatric: Mood and affect are  normal. Speech and behavior are normal.  ____________________________________________   LABS (all labs ordered are listed, but only abnormal results are displayed)  No results found for this or any previous visit (from the past 24 hour(s)). ____________________________________________  EKG My review and personal interpretation at Time: 11:40   Indication: palpitations  Rate: 125  Rhythm: sinus Axis: normal Other: normal intervals, no stemi ____________________________________________  RADIOLOGY  I personally reviewed all radiographic images ordered to evaluate for the above acute complaints and reviewed radiology reports and findings.  These findings were personally discussed with the patient.  Please see medical record for radiology report.  ____________________________________________   PROCEDURES  Procedure(s) performed:  Procedures    Critical Care performed: no ____________________________________________   INITIAL IMPRESSION / ASSESSMENT AND PLAN / ED COURSE  Pertinent labs & imaging results that were available during my care of the patient were reviewed by me and considered in my medical decision making (see chart for details).  DDX: anaphylaxis, anaphylactoid, pharyngitis, uri, panic attack  Kimberly Torres is a 38 y.o. who presents to the ED with symptoms as described above.  Symptoms seem to be fairly mild but she does have a little bit of edema on her uvula.  Does not appear infectious given the recent history certainly concerning for anaphylactic or allergic reaction.  Timing is a bit odd for vaccine related allergic reaction but given her constellation of findings will treat for allergic reaction and observe in the ER.  Clinical Course as of May 31 1945  Mon Jun 01, 2019  1651 Patient reassessed.  Edema resolved.  Since she had very mild symptoms at this time.  Will observe.  She is tolerating p.o. at this time.   [PR]  1810 Patient rechecked.   Feels well.  Still mildly tachycardic but well perfused and exceedingly well appearing clinically.  No hypoxia.  Feels a little bit jittery having received injection but no signs of persistent allergic reaction at this time I do believe she is appropriate for outpatient follow-up.  Discussed signs and symptoms for which she should return to the ER.   [PR]    Clinical Course User Index [PR] Merlyn Lot, MD    The patient was evaluated in Emergency Department today for the symptoms described in the history of present illness. He/she was evaluated in the context of the global COVID-19 pandemic, which necessitated consideration that the patient might be at risk for infection with the SARS-CoV-2 virus that causes COVID-19. Institutional protocols and algorithms that pertain to the evaluation of patients at risk for COVID-19 are in a state of rapid change based on information released by regulatory bodies including the CDC and federal and state organizations. These policies and algorithms were followed during the patient's care in the ED.  As part of my medical decision making, I reviewed the following data within the [place] notes reviewed and incorporated, Labs reviewed, notes from prior ED visits and [place] Controlled Substance Database   ____________________________________________   FINAL CLINICAL IMPRESSION(S) / ED DIAGNOSES  Final diagnoses:  Allergic reaction, initial encounter      NEW MEDICATIONS STARTED DURING THIS VISIT:  Discharge Medication List as of 06/01/2019  6:11 PM    START taking these medications   Details  EPINEPHrine 0.3 mg/0.3 mL IJ SOAJ injection Inject 0.3 mLs (0.3 mg total) into the muscle as needed for anaphylaxis., Starting Mon 06/01/2019, Normal    predniSONE (DELTASONE) 20 MG tablet Take 2 tablets (40 mg total) by mouth daily for 5 days., Starting Mon 06/01/2019, Until Sat 06/06/2019, Normal         Note:  This document was  prepared using Dragon voice recognition software and may include unintentional dictation errors.    Merlyn Lot, MD 06/01/19 1946

## 2019-06-01 NOTE — ED Notes (Signed)
Pt on phone with family/friend. Given water and snack. Encouraged to attempt to eat/drink.

## 2019-06-01 NOTE — ED Notes (Signed)
Pt denies SOB. States throat feels sore. Denies CP or nausea. States remains a little dizzy.

## 2019-06-03 DIAGNOSIS — M25641 Stiffness of right hand, not elsewhere classified: Secondary | ICD-10-CM | POA: Diagnosis not present

## 2019-06-10 DIAGNOSIS — M25641 Stiffness of right hand, not elsewhere classified: Secondary | ICD-10-CM | POA: Diagnosis not present

## 2019-06-11 ENCOUNTER — Ambulatory Visit (INDEPENDENT_AMBULATORY_CARE_PROVIDER_SITE_OTHER): Payer: 59 | Admitting: Family Medicine

## 2019-06-11 ENCOUNTER — Encounter: Payer: Self-pay | Admitting: Family Medicine

## 2019-06-11 ENCOUNTER — Other Ambulatory Visit: Payer: Self-pay

## 2019-06-11 VITALS — BP 126/72 | HR 97 | Temp 97.6°F | Ht 68.0 in | Wt 231.4 lb

## 2019-06-11 DIAGNOSIS — M7989 Other specified soft tissue disorders: Secondary | ICD-10-CM

## 2019-06-11 DIAGNOSIS — T50Z95S Adverse effect of other vaccines and biological substances, sequela: Secondary | ICD-10-CM

## 2019-06-11 DIAGNOSIS — T50Z95A Adverse effect of other vaccines and biological substances, initial encounter: Secondary | ICD-10-CM | POA: Insufficient documentation

## 2019-06-11 LAB — CBC WITH DIFFERENTIAL/PLATELET
Basophils Absolute: 0.1 10*3/uL (ref 0.0–0.1)
Basophils Relative: 0.7 % (ref 0.0–3.0)
Eosinophils Absolute: 0.1 10*3/uL (ref 0.0–0.7)
Eosinophils Relative: 1.3 % (ref 0.0–5.0)
HCT: 38.1 % (ref 36.0–46.0)
Hemoglobin: 12.6 g/dL (ref 12.0–15.0)
Lymphocytes Relative: 24.1 % (ref 12.0–46.0)
Lymphs Abs: 2.2 10*3/uL (ref 0.7–4.0)
MCHC: 33.1 g/dL (ref 30.0–36.0)
MCV: 87.6 fl (ref 78.0–100.0)
Monocytes Absolute: 0.5 10*3/uL (ref 0.1–1.0)
Monocytes Relative: 5.8 % (ref 3.0–12.0)
Neutro Abs: 6.1 10*3/uL (ref 1.4–7.7)
Neutrophils Relative %: 68.1 % (ref 43.0–77.0)
Platelets: 278 10*3/uL (ref 150.0–400.0)
RBC: 4.35 Mil/uL (ref 3.87–5.11)
RDW: 12.7 % (ref 11.5–15.5)
WBC: 9 10*3/uL (ref 4.0–10.5)

## 2019-06-11 NOTE — Patient Instructions (Signed)
Let's watch this axillary swelling  A warm compress may help Let me know if it worsens/ gets larger or uncomfortable We will check a cbc today   If you develop other symptoms -like fever / or other areas of swelling please let me know   Follow up in 1 month for a re check

## 2019-06-11 NOTE — Progress Notes (Signed)
Subjective:    Patient ID: Kimberly Torres, female    DOB: Jan 20, 1981, 39 y.o.   MRN: [id number]  HPI  Pt had recent allergic reaction to the phizer covid vaccine  She experienced palpitations and throat scratchiness and sob about 30 minutes after vaccination  (when she was driving)  She was seen in the ED 12/28 slt swelling was seen over uvula  She was tx with epi shot and prednisone and benadryl   Has had some dizziness since the reaction  Cannot drive or work  It happens when she walks  Light headed / not spinning  She is supposed to go to a workman's comp clinic   BP Readings from Last 3 Encounters:  06/11/19 126/72  06/01/19 132/75  10/06/18 112/78   Pulse Readings from Last 3 Encounters:  06/11/19 97  06/01/19 (!) 116  10/06/18 88    Vaccine was in R arm   She presents today with swelling of LN in the L axilla  Arm feels tight  It is tender to the touch   No systemic symptoms except for dizziness No breast lumps  She does shave under her arms   Had a R trigger finger release in dec-that went well   Patient Active Problem List   Diagnosis Date Noted  . Left axillary swelling 06/11/2019  . Elevated blood pressure reading 09/18/2018  . Obesity (BMI 30-39.9) 05/12/2017  . 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 HNP (herniated nucleus pulposus), lumbar 09/28/2013  . 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 Cluster headache 02/15/2012  . Routine general medical examination at a health care facility 01/28/2012  . Sinus tachycardia 09/11/2010  . HEMANGIOMA, HEPATIC 03/14/2009  . Attention deficit hyperactivity disorder (ADHD) 06/25/2007  . SCOLIOSIS 06/25/2007   Past Medical History:  Diagnosis Date  . Acne    sees derm- on accutane  . Anxiety   . Chondromalacia of right knee    right  . Depression   . Dysrhythmia    hx SVT  . H/O seasonal allergies   . Headache(784.0)    cluster migraines  . Insomnia   . Sinus  tachycardia    eval by Dr. Stanford Breed in 2007: monior revealed sinus tach. vs EAT; Echo 10/07: EF 70%  . Varicose veins    eval. by vascular surgeon and procedure scheduled to correct   Past Surgical History:  Procedure Laterality Date  . CESAREAN SECTION  2009  . DIAGNOSTIC LAPAROSCOPY  2010   scar tissue  . KNEE SURGERY  04/10/2010  . LUMBAR LAMINECTOMY/DECOMPRESSION MICRODISCECTOMY Right 09/28/2013   Procedure: LUMBAR LAMINECTOMY/DECOMPRESSION MICRODISCECTOMY 1 LEVEL,LUMBAR  THREE-FOUR RIGHT ;  Surgeon: Charlie Pitter, MD;  Location: [facility];  Service: Neurosurgery;  Laterality: Right;  right  . MYRINGOTOMY     Social History   Tobacco Use  . Smoking status: Never Smoker  . Smokeless tobacco: Never Used  Substance Use Topics  . Alcohol use: Yes    Alcohol/week: 0.0 standard drinks    Comment: occasional to rare  . Drug use: No   Family History  Problem Relation Age of Onset  . Hypertension Mother   . ADD / ADHD Mother   . Coronary artery disease Other        grandfather  . Heart attack Other        grandfather  . Breast cancer Other        grandmother  . Bipolar disorder Other  grandmother  . ADD / ADHD Sister   . COPD Maternal Grandfather    Allergies  Allergen Reactions  . Covid-19 Mrna Vacc (Moderna) Anaphylaxis    WAS GIVEN THE PFIZER NOT MODERNA  . Shellfish Allergy     Questionable - did have rash once    Current Outpatient Medications on File Prior to Visit  Medication Sig Dispense Refill  . EPINEPHrine 0.3 mg/0.3 mL IJ SOAJ injection Inject 0.3 mLs (0.3 mg total) into the muscle as needed for anaphylaxis. 1 each 1  . fluticasone (FLONASE) 50 MCG/ACT nasal spray Place 2 sprays into both nostrils daily as needed for allergies.     . folic acid (FOLVITE) 1 MG tablet Take 1 mg by mouth daily.    . hydrOXYzine (ATARAX/VISTARIL) 50 MG tablet Take 1-2 tablets (50-100 mg total) by mouth at bedtime as needed. 180 tablet 1  . ibuprofen (ADVIL,MOTRIN) 600 MG  tablet Take by mouth.    . Melatonin 3 MG CAPS Take 2 capsules by mouth daily as needed.     . metoprolol tartrate (LOPRESSOR) 25 MG tablet Take 1 tablet (25 mg total) by mouth 2 (two) times daily. (Patient taking differently: Take 25 mg by mouth 2 (two) times daily. As needed for elevated blood pressure, headache or high pulse rate) 60 tablet 11  . Norethindrone-Ethinyl Estradiol-Fe Biphas (LO LOESTRIN FE) 1 MG-10 MCG / 10 MCG tablet Take 1 tablet by mouth daily.    . Omega-3 Fatty Acids (FISH OIL) 1000 MG CAPS Take 2 capsules by mouth daily.    . Probiotic Product (CULTURELLE PROBIOTICS PO) Take 1 tablet by mouth daily.    Marland Kitchen tretinoin (RETIN-A) 0.025 % cream Apply topically 3 (three) times a week.      No current facility-administered medications on file prior to visit.     Review of Systems  Constitutional: Negative for activity change, appetite change, fatigue, fever and unexpected weight change.  HENT: Negative for congestion, ear pain, rhinorrhea, sinus pressure and sore throat.   Eyes: Negative for pain, redness and visual disturbance.  Respiratory: Negative for cough, shortness of breath and wheezing.   Cardiovascular: Negative for chest pain and palpitations.  Gastrointestinal: Negative for abdominal pain, blood in stool, constipation and diarrhea.  Endocrine: Negative for polydipsia and polyuria.  Genitourinary: Negative for dysuria, frequency and urgency.  Musculoskeletal: Negative for arthralgias, back pain and myalgias.  Skin: Negative for pallor and rash.  Allergic/Immunologic: Negative for environmental allergies.  Neurological: Positive for dizziness and light-headedness. Negative for tremors, syncope, weakness and headaches.  Hematological: Negative for adenopathy. Does not bruise/bleed easily.  Psychiatric/Behavioral: Negative for decreased concentration and dysphoric mood. The patient is not nervous/anxious.        Objective:   Physical Exam Constitutional:       General: She is not in acute distress.    Appearance: She is well-developed. She is obese.  HENT:     Head: Normocephalic and atraumatic.  Eyes:     Conjunctiva/sclera: Conjunctivae normal.     Pupils: Pupils are equal, round, and reactive to light.  Neck:     Thyroid: No thyromegaly.     Vascular: No carotid bruit or JVD.  Cardiovascular:     Rate and Rhythm: Regular rhythm. Tachycardia present.     Heart sounds: Normal heart sounds. No gallop.   Pulmonary:     Effort: Pulmonary effort is normal. No respiratory distress.     Breath sounds: Normal breath sounds. No wheezing or rales.  Abdominal:     General: Bowel sounds are normal. There is no distension or abdominal bruit.     Palpations: Abdomen is soft. There is no mass.     Tenderness: There is no abdominal tenderness.  Genitourinary:    Comments: Breast exam: No mass, nodules, thickening, tenderness, bulging, retraction, inflamation, nipple discharge or skin changes noted.  No axillary or clavicular LA.      L axilla is more full than the right  No discrete mass palpated and no skin change or warmth Slight tenderness noted  Musculoskeletal:     Cervical back: Normal range of motion and neck supple.  Lymphadenopathy:     Cervical: No cervical adenopathy.  Skin:    General: Skin is warm and dry.     Coloration: Skin is not pale.     Findings: No erythema or rash.  Neurological:     General: No focal deficit present.     Mental Status: She is alert.     Cranial Nerves: No cranial nerve deficit.     Motor: No weakness.     Coordination: Coordination normal.     Gait: Gait normal.     Deep Tendon Reflexes: Reflexes are normal and symmetric. Reflexes normal.  Psychiatric:        Mood and Affect: Mood normal.           Assessment & Plan:   Problem List Items Addressed This Visit      Other   Left axillary swelling - Primary    This followed an immunization reaction (other arm)-unsure if related  No discreet LN  palpated but area feels more full than other side No adenopathy in other areas and no skin changes  Low threshold to image if no improvement  Breast exam is normal  Encouraged use of warm compresses  Cbc with diff today  inst to update if worse or not improving in next several weeks Re check in office in 1 mo        Relevant Orders   CBC w/Diff (Completed)   Immunization reaction    Mild anaphylactic symptoms noted 30 min after covid vaccine (Phizer)  She was seen in ED- records reviewed Improved with steroids and benadryl fairly quickly  Still c/o of dizziness (with movement) but no HA Has upcoming f/u with workman's comp  Reassuring exam

## 2019-06-11 NOTE — Assessment & Plan Note (Signed)
Mild anaphylactic symptoms noted 30 min after covid vaccine (Phizer)  She was seen in ED- records reviewed Improved with steroids and benadryl fairly quickly  Still c/o of dizziness (with movement) but no HA Has upcoming f/u with workman's comp  Reassuring exam

## 2019-06-11 NOTE — Assessment & Plan Note (Signed)
This followed an immunization reaction (other arm)-unsure if related  No discreet LN palpated but area feels more full than other side No adenopathy in other areas and no skin changes  Low threshold to image if no improvement  Breast exam is normal  Encouraged use of warm compresses  Cbc with diff today  inst to update if worse or not improving in next several weeks Re check in office in 1 mo

## 2019-06-17 ENCOUNTER — Telehealth: Payer: Self-pay | Admitting: Family Medicine

## 2019-06-17 NOTE — Telephone Encounter (Signed)
The form has specific documentation re: what pt is capable of /limited to physically - please schedule her for a virtual visit so I can go through each of those and finish her paperwork  I will hold it on my desk

## 2019-06-17 NOTE — Telephone Encounter (Signed)
Patient scheduled virtual appointment on 06/18/19 at 9:00.

## 2019-06-17 NOTE — Telephone Encounter (Signed)
The [employer] paperwork in dr tower's in box for review and signature/ Pt stated her first day out of work was 06/02/2019 she is working from home now starting 06/15/2019.  She stated she still is dizzy this has not went away

## 2019-06-18 ENCOUNTER — Other Ambulatory Visit: Payer: Self-pay

## 2019-06-18 ENCOUNTER — Ambulatory Visit (INDEPENDENT_AMBULATORY_CARE_PROVIDER_SITE_OTHER): Payer: 59 | Admitting: Family Medicine

## 2019-06-18 ENCOUNTER — Encounter: Payer: Self-pay | Admitting: Family Medicine

## 2019-06-18 ENCOUNTER — Telehealth: Payer: Self-pay | Admitting: Family Medicine

## 2019-06-18 VITALS — BP 116/72 | HR 91

## 2019-06-18 DIAGNOSIS — T50Z95S Adverse effect of other vaccines and biological substances, sequela: Secondary | ICD-10-CM

## 2019-06-18 DIAGNOSIS — T887XXD Unspecified adverse effect of drug or medicament, subsequent encounter: Secondary | ICD-10-CM | POA: Diagnosis not present

## 2019-06-18 DIAGNOSIS — Z560 Unemployment, unspecified: Secondary | ICD-10-CM

## 2019-06-18 DIAGNOSIS — T50Z95D Adverse effect of other vaccines and biological substances, subsequent encounter: Secondary | ICD-10-CM

## 2019-06-18 NOTE — Telephone Encounter (Signed)
Received a call from Joneen Caraway with a [employer] was very hard to understand what company he was calling from. (Sounded like [employer]) Call back number was able to be obtained.   Claim # [id number]  Needing more information in order to process the claim  - ICD10 code - Out of work date - Return to work date - Next OV date  Requesting a call back to discuss this claim.

## 2019-06-18 NOTE — Telephone Encounter (Signed)
Per Shirlee Limerick @ [employer] Please add date of disability and next appointment date refaxed paperwork

## 2019-06-18 NOTE — Telephone Encounter (Signed)
Tried call russell could not leave message

## 2019-06-18 NOTE — Assessment & Plan Note (Signed)
Pt is working from home due to symptom of dizziness after immunization reaction (covid vaccine) Gradually improving  Forms filled out for work  [facility] to be well enough to return by 06/29/19

## 2019-06-18 NOTE — Assessment & Plan Note (Signed)
Pt is gradually improving from her reaction but still suffers from disabling dizziness when standing/walking  She is currently working from home  Given her rate of improvement I hope and expect she will feel well enough for full duty on 06/29/19 Paperwork done today Enc good self care/sleep/nurtition and fluids

## 2019-06-18 NOTE — Patient Instructions (Signed)
I will complete your paperwork  Take care of yourself  Hopeful to be able to get back to work by 06/29/19 Keep [place] posted

## 2019-06-18 NOTE — Progress Notes (Signed)
Virtual Visit via Video Note  I connected with Kimberly Torres on 06/18/19 at  9:00 AM EST by a video enabled telemedicine application and verified that I am speaking with the correct person using two identifiers.  Location: Patient: home Provider: office    I discussed the limitations of evaluation and management by telemedicine and the availability of in person appointments. The patient expressed understanding and agreed to proceed.  Parties involved in encounter  Patient: Kimberly Torres   Provider:  Loura Pardon MD    Video failed today and visit was competed by phone   History of Present Illness: Pt presents for f/u of visit for a covid immunization reaction  She has disability forms to fill out today  She was last seen 06/11/19 for reaction after taking the phizer covid vaccine  Had symptoms of anapylaxis (palpitations/throat scratchiness) and was seen in the ED on 12/28 shortly after the vaccine She was tx with epi injection and prednisone and benadryl   Had voiced dizziness since then  Could not work or drive  Light headed with walking  BP: 116/72(pt reported)  Pulse Rate: 91(pt reported)  Pulse ox 98% on RA  Now she is still dizzy when she tries to stand or walk  She was getting more confident this weekend and then light headed and had to sit down  Improving but not gone  Still there and then catches her off guard   No new symptoms   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)  Does not want to risk a fall  Monday her supervisor got her a lap top and she has been working from home since 06/15/19 (sitting in a safe manner) -they are working with her   L axillary swelling is over /completely gone now   No fever  No flushing or rash  At night- heart goes fast at times (has had issues in the past however)  No sob /no swelling of mouth or throat since 1/28   Workman's comp rejected a claim (since she has allergy to vaccine and not work caused)    She is anxious to get back   Currently pt is unable to (w/o dizziness) : bend at waist, kneel or crouch, climb, balance, or drive  She is R handed No problems with upper extremity activity as long as she is sitting   At the rate of improvement suspect she will be much improved in about 12 days (goal of return to in person work w/o restrictions is 06/29/19)  No cognitive or psychiatric issues   Patient Active Problem List   Diagnosis Date Noted  . Immunization reaction 06/11/2019  . Elevated blood pressure reading 09/18/2018  . Obesity (BMI 30-39.9) 05/12/2017  . 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 HNP (herniated nucleus pulposus), lumbar 09/28/2013  . 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 Cluster headache 02/15/2012  . Routine general medical examination at a health care facility 01/28/2012  . Sinus tachycardia 09/11/2010  . HEMANGIOMA, HEPATIC 03/14/2009  . Attention deficit hyperactivity disorder (ADHD) 06/25/2007  . SCOLIOSIS 06/25/2007   Past Medical History:  Diagnosis Date  . Acne    sees derm- on accutane  . Anxiety   . Chondromalacia of right knee    right  . Depression   . Dysrhythmia    hx SVT  . H/O seasonal allergies   . Headache(784.0)    cluster migraines  . Insomnia   . Sinus tachycardia  eval by Dr. Stanford Breed in 2007: monior revealed sinus tach. vs EAT; Echo 10/07: EF 70%  . Varicose veins    eval. by vascular surgeon and procedure scheduled to correct   Past Surgical History:  Procedure Laterality Date  . CESAREAN SECTION  2009  . DIAGNOSTIC LAPAROSCOPY  2010   scar tissue  . KNEE SURGERY  04/10/2010  . LUMBAR LAMINECTOMY/DECOMPRESSION MICRODISCECTOMY Right 09/28/2013   Procedure: LUMBAR LAMINECTOMY/DECOMPRESSION MICRODISCECTOMY 1 LEVEL,LUMBAR  THREE-FOUR RIGHT ;  Surgeon: Charlie Pitter, MD;  Location: [facility];  Service: Neurosurgery;  Laterality: Right;  right  . MYRINGOTOMY     Social History   Tobacco Use   . Smoking status: Never Smoker  . Smokeless tobacco: Never Used  Substance Use Topics  . Alcohol use: Yes    Alcohol/week: 0.0 standard drinks    Comment: occasional to rare  . Drug use: No   Family History  Problem Relation Age of Onset  . Hypertension Mother   . ADD / ADHD Mother   . Coronary artery disease Other        grandfather  . Heart attack Other        grandfather  . Breast cancer Other        grandmother  . Bipolar disorder Other        grandmother  . ADD / ADHD Sister   . COPD Maternal Grandfather    Allergies  Allergen Reactions  . Covid-19 Mrna Vacc (Moderna) Anaphylaxis    WAS GIVEN THE PFIZER NOT MODERNA  . Shellfish Allergy     Questionable - did have rash once    Current Outpatient Medications on File Prior to Visit  Medication Sig Dispense Refill  . EPINEPHrine 0.3 mg/0.3 mL IJ SOAJ injection Inject 0.3 mLs (0.3 mg total) into the muscle as needed for anaphylaxis. 1 each 1  . fluticasone (FLONASE) 50 MCG/ACT nasal spray Place 2 sprays into both nostrils daily as needed for allergies.     . folic acid (FOLVITE) 1 MG tablet Take 1 mg by mouth daily.    . hydrOXYzine (ATARAX/VISTARIL) 50 MG tablet Take 1-2 tablets (50-100 mg total) by mouth at bedtime as needed. 180 tablet 1  . ibuprofen (ADVIL,MOTRIN) 600 MG tablet Take by mouth.    . Melatonin 3 MG CAPS Take 2 capsules by mouth daily as needed.     . metoprolol tartrate (LOPRESSOR) 25 MG tablet Take 1 tablet (25 mg total) by mouth 2 (two) times daily. (Patient taking differently: Take 25 mg by mouth 2 (two) times daily. As needed for elevated blood pressure, headache or high pulse rate) 60 tablet 11  . Norethindrone-Ethinyl Estradiol-Fe Biphas (LO LOESTRIN FE) 1 MG-10 MCG / 10 MCG tablet Take 1 tablet by mouth daily.    . Omega-3 Fatty Acids (FISH OIL) 1000 MG CAPS Take 2 capsules by mouth daily.    . Probiotic Product (CULTURELLE PROBIOTICS PO) Take 1 tablet by mouth daily.    Marland Kitchen tretinoin (RETIN-A) 0.025  % cream Apply topically 3 (three) times a week.      No current facility-administered medications on file prior to visit.     Review of Systems  Constitutional: Negative for chills, fever and malaise/fatigue.  HENT: Negative for congestion, ear pain, sinus pain and sore throat.   Eyes: Negative for blurred vision, discharge and redness.  Respiratory: Negative for cough, shortness of breath and stridor.   Cardiovascular: Negative for chest pain, palpitations and leg swelling.  Gastrointestinal:  Negative for abdominal pain, diarrhea, nausea and vomiting.  Musculoskeletal: Negative for myalgias.  Skin: Negative for rash.  Neurological: Positive for dizziness. Negative for headaches.      Observations/Objective: Pt sounds well/like her usual self and not distressed  Not hoarse and no slurred speech No sob or cough during interview Cognitively nl and good historian  Mood and affect are normal Talkative and pleasant   Assessment and Plan: Problem List Items Addressed This Visit      Other   Immunization reaction - Primary    Pt is gradually improving from her reaction but still suffers from disabling dizziness when standing/walking  She is currently working from home  Given her rate of improvement I hope and expect she will feel well enough for full duty on 06/29/19 Paperwork done today Enc good self care/sleep/nurtition and fluids       Not currently working due to disabled status    Pt is working from home due to symptom of dizziness after immunization reaction (covid vaccine) Gradually improving  Forms filled out for work  [facility] to be well enough to return by 06/29/19          Follow Up Instructions: I will complete your paperwork  Take care of yourself  Hopeful to be able to get back to work by 06/29/19 Keep [place] posted    I discussed the assessment and treatment plan with the patient. The patient was provided an opportunity to ask questions and all were answered. The  patient agreed with the plan and demonstrated an understanding of the instructions.   The patient was advised to call back or seek an in-person evaluation if the symptoms worsen or if the condition fails to improve as anticipated.  I provided 18 minutes of non-face-to-face time during this encounter.   Loura Pardon, MD

## 2019-06-18 NOTE — Telephone Encounter (Signed)
Form given back to Robin after virtual visit

## 2019-06-19 NOTE — Telephone Encounter (Signed)
Pt aware °Copy for scan °Copy for pt °

## 2019-06-25 DIAGNOSIS — F902 Attention-deficit hyperactivity disorder, combined type: Secondary | ICD-10-CM | POA: Diagnosis not present

## 2019-06-25 DIAGNOSIS — F411 Generalized anxiety disorder: Secondary | ICD-10-CM | POA: Diagnosis not present

## 2019-06-25 DIAGNOSIS — F410 Panic disorder [episodic paroxysmal anxiety] without agoraphobia: Secondary | ICD-10-CM | POA: Diagnosis not present

## 2019-06-25 MED FILL — SERTRALINE HCL 50 MG TABLET: 50 | 30 days supply | Qty: 30 | Fill #0

## 2019-06-25 MED FILL — clonazePAM 0.5 MG TABS: 0.5 | 30 days supply | Qty: 60 | Fill #0

## 2019-07-11 DIAGNOSIS — F902 Attention-deficit hyperactivity disorder, combined type: Secondary | ICD-10-CM | POA: Diagnosis not present

## 2019-07-11 DIAGNOSIS — F411 Generalized anxiety disorder: Secondary | ICD-10-CM | POA: Diagnosis not present

## 2019-07-11 DIAGNOSIS — F410 Panic disorder [episodic paroxysmal anxiety] without agoraphobia: Secondary | ICD-10-CM | POA: Diagnosis not present

## 2019-07-13 ENCOUNTER — Ambulatory Visit (INDEPENDENT_AMBULATORY_CARE_PROVIDER_SITE_OTHER): Payer: 59 | Admitting: Family Medicine

## 2019-07-13 ENCOUNTER — Other Ambulatory Visit: Payer: Self-pay

## 2019-07-13 ENCOUNTER — Encounter: Payer: Self-pay | Admitting: Family Medicine

## 2019-07-13 DIAGNOSIS — F410 Panic disorder [episodic paroxysmal anxiety] without agoraphobia: Secondary | ICD-10-CM | POA: Insufficient documentation

## 2019-07-13 DIAGNOSIS — T50Z95S Adverse effect of other vaccines and biological substances, sequela: Secondary | ICD-10-CM

## 2019-07-13 DIAGNOSIS — F411 Generalized anxiety disorder: Secondary | ICD-10-CM

## 2019-07-13 NOTE — Assessment & Plan Note (Signed)
Pt suspects some degree of PTSD from her covid imm reaction   (fearful she will react to something else)  Saw psychiatry (Dr Nicolasa Ducking) and is taking sertraline as well as prn clonazepam (short term)  Enc good self care, use of meditation and exercise when ready to start  She will f/u with psychiatry as planned

## 2019-07-13 NOTE — Assessment & Plan Note (Signed)
Much improvement  Caused some anxiety/panic in retrospect and she is being treated  Dizziness is almost entirely gone and she is back to work with a regular schedule inst to call if if anything worsens

## 2019-07-13 NOTE — Progress Notes (Signed)
Virtual Visit via Telephone Note  I connected with Kimberly Torres on 07/13/19 at  2:00 PM EST by telephone and verified that I am speaking with the correct person using two identifiers.  Location: Patient: home Provider: office   I discussed the limitations, risks, security and privacy concerns of performing an evaluation and management service by telephone and the availability of in person appointments. I also discussed with the patient that there may be a patient responsible charge related to this service. The patient expressed understanding and agreed to proceed.  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 Generalized anxiety disorder with panic attacks 07/13/2019  . Immunization reaction 06/11/2019  . Elevated blood pressure reading 09/18/2018  . Obesity (BMI 30-39.9) 05/12/2017  . 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 HNP (herniated nucleus pulposus), lumbar 09/28/2013  . 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 Cluster headache 02/15/2012   . Routine general medical examination at a health care facility 01/28/2012  . Sinus tachycardia 09/11/2010  . HEMANGIOMA, HEPATIC 03/14/2009  . Attention deficit hyperactivity disorder (ADHD) 06/25/2007  . SCOLIOSIS 06/25/2007   Past Medical History:  Diagnosis Date  . Acne    sees derm- on accutane  . Anxiety   . Chondromalacia of right knee    right  . Depression   . Dysrhythmia    hx SVT  . H/O seasonal allergies   . Headache(784.0)    cluster migraines  . Insomnia   . Sinus tachycardia    eval by Dr. Stanford Breed in 2007: monior revealed sinus tach. vs EAT; Echo 10/07: EF 70%  . Varicose veins    eval. by vascular surgeon and procedure scheduled to correct   Past Surgical History:  Procedure Laterality Date  . CESAREAN SECTION  2009  . DIAGNOSTIC LAPAROSCOPY  2010   scar tissue  . KNEE SURGERY  04/10/2010  . LUMBAR LAMINECTOMY/DECOMPRESSION MICRODISCECTOMY Right 09/28/2013   Procedure: LUMBAR LAMINECTOMY/DECOMPRESSION MICRODISCECTOMY 1 LEVEL,LUMBAR  THREE-FOUR RIGHT ;  Surgeon: Charlie Pitter, MD;  Location: [facility];  Service: Neurosurgery;  Laterality: Right;  right  . MYRINGOTOMY     Social History   Tobacco Use  . Smoking status: Never Smoker  . Smokeless tobacco: Never Used  Substance Use Topics  . Alcohol use: Yes    Alcohol/week: 0.0 standard drinks    Comment: occasional to rare  . Drug use: No   Family History  Problem  Relation Age of Onset  . Hypertension Mother   . ADD / ADHD Mother   . Coronary artery disease Other        grandfather  . Heart attack Other        grandfather  . Breast cancer Other        grandmother  . Bipolar disorder Other        grandmother  . ADD / ADHD Sister   . COPD Maternal Grandfather    Allergies  Allergen Reactions  . Covid-19 Mrna Vacc (Moderna) Anaphylaxis    WAS GIVEN THE PFIZER NOT MODERNA  . Shellfish Allergy     Questionable - did have rash once    Current Outpatient Medications on File Prior to Visit   Medication Sig Dispense Refill  . EPINEPHrine 0.3 mg/0.3 mL IJ SOAJ injection Inject 0.3 mLs (0.3 mg total) into the muscle as needed for anaphylaxis. 1 each 1  . fluticasone (FLONASE) 50 MCG/ACT nasal spray Place 2 sprays into both nostrils daily as needed for allergies.     . folic acid (FOLVITE) 1 MG tablet Take 1 mg by mouth daily.    . hydrOXYzine (ATARAX/VISTARIL) 50 MG tablet Take 1-2 tablets (50-100 mg total) by mouth at bedtime as needed. 180 tablet 1  . ibuprofen (ADVIL,MOTRIN) 600 MG tablet Take by mouth.    . Melatonin 3 MG CAPS Take 2 capsules by mouth daily as needed.     . metoprolol tartrate (LOPRESSOR) 25 MG tablet Take 1 tablet (25 mg total) by mouth 2 (two) times daily. (Patient taking differently: Take 25 mg by mouth 2 (two) times daily. As needed for elevated blood pressure, headache or high pulse rate) 60 tablet 11  . Norethindrone-Ethinyl Estradiol-Fe Biphas (LO LOESTRIN FE) 1 MG-10 MCG / 10 MCG tablet Take 1 tablet by mouth daily.    . Omega-3 Fatty Acids (FISH OIL) 1000 MG CAPS Take 2 capsules by mouth daily.    . Probiotic Product (CULTURELLE PROBIOTICS PO) Take 1 tablet by mouth daily.    Marland Kitchen tretinoin (RETIN-A) 0.025 % cream Apply topically 3 (three) times a week.      No current facility-administered medications on file prior to visit.   Review of Systems  Constitutional: Negative for chills, fever and malaise/fatigue.  HENT: Negative for congestion, ear pain, sinus pain and sore throat.   Eyes: Negative for blurred vision, discharge and redness.  Respiratory: Negative for cough, shortness of breath and stridor.   Cardiovascular: Negative for chest pain, palpitations and leg swelling.  Gastrointestinal: Negative for abdominal pain, diarrhea, nausea and vomiting.  Musculoskeletal: Negative for myalgias.  Skin: Negative for rash.  Neurological: Positive for dizziness. Negative for sensory change, speech change, focal weakness and headaches.       Dizziness is much  improved and infrequent   Psychiatric/Behavioral: Negative for depression and suicidal ideas. The patient is nervous/anxious. The patient does not have insomnia.     Observations/Objective: Pt sounds well, not in distress  Not hoarse, voice is baseline  Mildly anxious (just got over a panic attack)  Not tearful or shaky  Candidly discusses symptoms/stressors  No cognitive changes , good historian   Assessment and Plan: Problem List Items Addressed This Visit      Other   Immunization reaction    Much improvement  Caused some anxiety/panic in retrospect and she is being treated  Dizziness is almost entirely gone and she is back to work with a regular schedule inst to call  if if anything worsens      Generalized anxiety disorder with panic attacks - Primary    Pt suspects some degree of PTSD from her covid imm reaction   (fearful she will react to something else)  Saw psychiatry (Dr Nicolasa Ducking) and is taking sertraline as well as prn clonazepam (short term)  Enc good self care, use of meditation and exercise when ready to start  She will f/u with psychiatry as planned           Follow Up Instructions: Continue current medications and follow up with Dr Nicolasa Ducking If symptoms worsen please let us know   Take care of yourself  Return to exercise gradually   I discussed the assessment and treatment plan with the patient. The patient was provided an opportunity to ask questions and all were answered. The patient agreed with the plan and demonstrated an understanding of the instructions.   The patient was advised to call back or seek an in-person evaluation if the symptoms worsen or if the condition fails to improve as anticipated.  I provided 14 minutes of non-face-to-face time during this encounter.   Loura Pardon, MD

## 2019-07-13 NOTE — Patient Instructions (Signed)
Continue current medications and follow up with Dr Nicolasa Ducking If symptoms worsen please let us know   Take care of yourself  Return to exercise gradually

## 2019-07-14 ENCOUNTER — Ambulatory Visit
Admission: RE | Admit: 2019-07-14 | Discharge: 2019-07-14 | Disposition: A | Discharge status: Home / Self Care | Payer: 59 | Source: Ambulatory Visit | Attending: Family Medicine | Admitting: Family Medicine

## 2019-07-14 DIAGNOSIS — Z1231 Encounter for screening mammogram for malignant neoplasm of breast: Secondary | ICD-10-CM | POA: Insufficient documentation

## 2019-07-20 ENCOUNTER — Encounter: Payer: Self-pay | Admitting: Family Medicine

## 2019-07-20 DIAGNOSIS — Z0000 Encounter for general adult medical examination without abnormal findings: Secondary | ICD-10-CM

## 2019-07-21 ENCOUNTER — Other Ambulatory Visit: Payer: Self-pay | Admitting: Family Medicine

## 2019-07-21 ENCOUNTER — Encounter: Payer: Self-pay | Admitting: Family Medicine

## 2019-07-21 DIAGNOSIS — R928 Other abnormal and inconclusive findings on diagnostic imaging of breast: Secondary | ICD-10-CM

## 2019-07-21 DIAGNOSIS — N6320 Unspecified lump in the left breast, unspecified quadrant: Secondary | ICD-10-CM

## 2019-07-22 ENCOUNTER — Other Ambulatory Visit (INDEPENDENT_AMBULATORY_CARE_PROVIDER_SITE_OTHER): Payer: 59

## 2019-07-22 ENCOUNTER — Ambulatory Visit
Admission: RE | Admit: 2019-07-22 | Discharge: 2019-07-22 | Disposition: A | Discharge status: Home / Self Care | Payer: 59 | Source: Ambulatory Visit | Attending: Family Medicine | Admitting: Family Medicine

## 2019-07-22 DIAGNOSIS — R928 Other abnormal and inconclusive findings on diagnostic imaging of breast: Secondary | ICD-10-CM | POA: Insufficient documentation

## 2019-07-22 DIAGNOSIS — Z0000 Encounter for general adult medical examination without abnormal findings: Secondary | ICD-10-CM | POA: Diagnosis not present

## 2019-07-22 DIAGNOSIS — N6489 Other specified disorders of breast: Secondary | ICD-10-CM | POA: Diagnosis not present

## 2019-07-22 DIAGNOSIS — N6320 Unspecified lump in the left breast, unspecified quadrant: Secondary | ICD-10-CM | POA: Insufficient documentation

## 2019-07-22 NOTE — Addendum Note (Signed)
Addended by: Ellamae Sia on: 07/22/2019 02:52 PM   Modules accepted: Orders

## 2019-07-23 ENCOUNTER — Other Ambulatory Visit: Payer: 59

## 2019-07-23 LAB — COMPREHENSIVE METABOLIC PANEL
AG Ratio: 1.6 (calc) (ref 1.0–2.5)
ALT: 13 U/L (ref 6–29)
AST: 15 U/L (ref 10–30)
Albumin: 4.1 g/dL (ref 3.6–5.1)
Alkaline phosphatase (APISO): 47 U/L (ref 31–125)
BUN: 10 mg/dL (ref 7–25)
CO2: 24 mmol/L (ref 20–32)
Calcium: 9.4 mg/dL (ref 8.6–10.2)
Chloride: 104 mmol/L (ref 98–110)
Creat: 0.92 mg/dL (ref 0.50–1.10)
Globulin: 2.6 g/dL (calc) (ref 1.9–3.7)
Glucose, Bld: 86 mg/dL (ref 65–99)
Potassium: 3.8 mmol/L (ref 3.5–5.3)
Sodium: 138 mmol/L (ref 135–146)
Total Bilirubin: 0.6 mg/dL (ref 0.2–1.2)
Total Protein: 6.7 g/dL (ref 6.1–8.1)

## 2019-07-23 LAB — LIPID PANEL
Cholesterol: 153 mg/dL
HDL: 50 mg/dL
LDL Cholesterol (Calc): 81 mg/dL
Non-HDL Cholesterol (Calc): 103 mg/dL
Total CHOL/HDL Ratio: 3.1 (calc)
Triglycerides: 121 mg/dL

## 2019-07-23 LAB — CBC WITH DIFFERENTIAL/PLATELET
Absolute Monocytes: 570 cells/uL (ref 200–950)
Basophils Absolute: 51 cells/uL (ref 0–200)
Basophils Relative: 0.6 %
Eosinophils Absolute: 34 cells/uL (ref 15–500)
Eosinophils Relative: 0.4 %
HCT: 38.4 % (ref 35.0–45.0)
Hemoglobin: 12.7 g/dL (ref 11.7–15.5)
Lymphs Abs: 2168 cells/uL (ref 850–3900)
MCH: 29.1 pg (ref 27.0–33.0)
MCHC: 33.1 g/dL (ref 32.0–36.0)
MCV: 88.1 fL (ref 80.0–100.0)
MPV: 10.3 fL (ref 7.5–12.5)
Monocytes Relative: 6.7 %
Neutro Abs: 5678 cells/uL (ref 1500–7800)
Neutrophils Relative %: 66.8 %
Platelets: 276 10*3/uL (ref 140–400)
RBC: 4.36 10*6/uL (ref 3.80–5.10)
RDW: 12.8 % (ref 11.0–15.0)
Total Lymphocyte: 25.5 %
WBC: 8.5 10*3/uL (ref 3.8–10.8)

## 2019-07-23 LAB — TSH: TSH: 3.7 m[IU]/L

## 2019-07-23 LAB — VITAMIN D 25 HYDROXY (VIT D DEFICIENCY, FRACTURES): Vit D, 25-Hydroxy: 22 ng/mL — ABNORMAL LOW (ref 30–100)

## 2019-07-27 ENCOUNTER — Other Ambulatory Visit: Payer: Self-pay | Admitting: Family Medicine

## 2019-07-27 ENCOUNTER — Ambulatory Visit (INDEPENDENT_AMBULATORY_CARE_PROVIDER_SITE_OTHER): Payer: 59 | Admitting: Family Medicine

## 2019-07-27 ENCOUNTER — Ambulatory Visit: Payer: 59

## 2019-07-27 ENCOUNTER — Other Ambulatory Visit: Payer: Self-pay

## 2019-07-27 ENCOUNTER — Other Ambulatory Visit: Payer: 59

## 2019-07-27 ENCOUNTER — Encounter: Payer: Self-pay | Admitting: Family Medicine

## 2019-07-27 VITALS — BP 122/78 | HR 89 | Temp 97.2°F | Ht 67.5 in | Wt 226.1 lb

## 2019-07-27 DIAGNOSIS — F410 Panic disorder [episodic paroxysmal anxiety] without agoraphobia: Secondary | ICD-10-CM

## 2019-07-27 DIAGNOSIS — F411 Generalized anxiety disorder: Secondary | ICD-10-CM

## 2019-07-27 DIAGNOSIS — R000 Tachycardia, unspecified: Secondary | ICD-10-CM | POA: Diagnosis not present

## 2019-07-27 DIAGNOSIS — E669 Obesity, unspecified: Secondary | ICD-10-CM | POA: Diagnosis not present

## 2019-07-27 DIAGNOSIS — E559 Vitamin D deficiency, unspecified: Secondary | ICD-10-CM

## 2019-07-27 DIAGNOSIS — Z0000 Encounter for general adult medical examination without abnormal findings: Secondary | ICD-10-CM | POA: Diagnosis not present

## 2019-07-27 DIAGNOSIS — N6489 Other specified disorders of breast: Secondary | ICD-10-CM | POA: Diagnosis not present

## 2019-07-27 MED ORDER — ERGOCALCIFEROL 1.25 MG (50000 UT) PO CAPS: 50000.0000 [IU] | ORAL_CAPSULE | ORAL | 0 refills | Status: DC

## 2019-07-27 MED ORDER — METOPROLOL TARTRATE 25 MG PO TABS: 25.0000 mg | ORAL_TABLET | Freq: Two times a day (BID) | ORAL | 3 refills | Status: DC | PRN

## 2019-07-27 NOTE — Assessment & Plan Note (Signed)
Discussed how this problem influences overall health and the risks it imposes  Reviewed plan for weight loss with lower calorie diet (via better food choices and also portion control or program like weight watchers) and exercise building up to or more than 30 minutes 5 days per week including some aerobic activity   5 lb loss- commended

## 2019-07-27 NOTE — Assessment & Plan Note (Signed)
Seeing Dr Nicolasa Ducking for psychiatry Taking zolofr and clonazepam  Good and bad days PTSD from imm rxn  Plans to start counseling soon  Enc exercise and good self care Reviewed stressors/ coping techniques/symptoms/ support sources/ tx options and side effects in detail today

## 2019-07-27 NOTE — Assessment & Plan Note (Signed)
Reviewed health habits including diet and exercise and skin cancer prevention Reviewed appropriate screening tests for age  Also reviewed health mt list, fam hx and immunization status , as well as social and family history   See HPI Labs reviewed  Mammogram -will need 6 mo f/u ([facility])  Pt plans to f/u with gyn next mo  Also dermatology  tx low D-new

## 2019-07-27 NOTE — Assessment & Plan Note (Signed)
Pt continues to take metoprolol prn  Anxiety worsens it

## 2019-07-27 NOTE — Progress Notes (Signed)
Subjective:    Patient ID: Kimberly Torres, female    DOB: Mar 15, 1981, 39 y.o.   MRN: [id number]  This visit occurred during the SARS-CoV-2 public health emergency.  Safety protocols were in place, including screening questions prior to the visit, additional usage of staff PPE, and extensive cleaning of exam room while observing appropriate contact time as indicated for disinfecting solutions.    HPI  Here for health maintenance exam and to review chronic medical problems    Wt Readings from Last 3 Encounters:  07/27/19 226 lb 1 oz (102.5 kg)  06/11/19 231 lb 7 oz (105 kg)  06/01/19 202 lb (91.6 kg)   34.88 kg/m   Started walking again- feels up to it  Watching her diet - down 5 lb so far   Working a lot  Not a lot of time for self care- this recently changed  Her schedule slowed down some- 20 hours per week  [facility] [employer] with cov[facility] vaccinations   Pap 1/19 -nl  Sees gyn  Has appt in march upcoming   Mammogram 2/21 -had dense area  Re check was re assuring: IMPRESSION: 1. The tubular asymmetry in the medial left breast is likely benign, possibly representing a blood vessel.  RECOMMENDATION: Six-month follow-up left breast mammogram.  I have discussed the findings and recommendations with the patient. If applicable, a reminder letter will be sent to the patient regarding the next appointment.  BI-RADS CATEGORY  3: Probably benign. She had a GM with breast cancer  Self breast exams - no lumps or changes She did get very anxious after her study    Tdap 3/16  Flu vaccine 10/20  H/o tachycardia and takes metoprolol as needed  Pulse Readings from Last 3 Encounters:  07/27/19 89  06/18/19 91  06/11/19 97   BP Readings from Last 3 Encounters:  07/27/19 122/78  06/18/19 116/72  06/11/19 126/72   Seeing psychiatry for anxiety with panic attacks On zoloft and klonopin  Feeling some better re: anxiety   (some good and bad days)    Seeing Dr Nicolasa Ducking  It will take time  Has PTSD  She will start counseling soon    Vit D is low in labs at 22 She takes 5000 iu daily    Cholesterol Lab Results  Component Value Date   CHOL 153 07/22/2019   CHOL 143 07/18/2018   CHOL 157 05/03/2017   Lab Results  Component Value Date   HDL 50 07/22/2019   HDL 54.80 07/18/2018   HDL 57.60 05/03/2017   Lab Results  Component Value Date   LDLCALC 81 07/22/2019   LDLCALC 72 07/18/2018   LDLCALC 86 05/03/2017   Lab Results  Component Value Date   TRIG 121 07/22/2019   TRIG 83.0 07/18/2018   TRIG 67.0 05/03/2017   Lab Results  Component Value Date   CHOLHDL 3.1 07/22/2019   CHOLHDL 3 07/18/2018   CHOLHDL 3 05/03/2017   No results found for: LDLDIRECT  Other labs Lab Results  Component Value Date   CREATININE 0.92 07/22/2019   BUN 10 07/22/2019   NA 138 07/22/2019   K 3.8 07/22/2019   CL 104 07/22/2019   CO2 24 07/22/2019    Glucose 86 Lab Results  Component Value Date   ALT 13 07/22/2019   AST 15 07/22/2019   ALKPHOS 51 07/18/2018   BILITOT 0.6 07/22/2019    Lab Results  Component Value Date   TSH 3.70  07/22/2019    Lab Results  Component Value Date   WBC 8.5 07/22/2019   HGB 12.7 07/22/2019   HCT 38.4 07/22/2019   MCV 88.1 07/22/2019   PLT 276 07/22/2019     Patient Active Problem List   Diagnosis Date Noted  . Vitamin D deficiency 07/27/2019  . Generalized anxiety disorder with panic attacks 07/13/2019  . Immunization reaction 06/11/2019  . Obesity (BMI 30-39.9) 05/12/2017  . 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 HNP (herniated nucleus pulposus), lumbar 09/28/2013  . 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 Cluster headache 02/15/2012  . Routine general medical examination at a health care facility 01/28/2012  . Sinus tachycardia 09/11/2010  . HEMANGIOMA, HEPATIC 03/14/2009  . Attention deficit hyperactivity disorder (ADHD) 06/25/2007  . SCOLIOSIS 06/25/2007    Past Medical History:  Diagnosis Date  . Acne    sees derm- on accutane  . Anxiety   . Chondromalacia of right knee    right  . Depression   . Dysrhythmia    hx SVT  . H/O seasonal allergies   . Headache(784.0)    cluster migraines  . Insomnia   . Sinus tachycardia    eval by Dr. Stanford Breed in 2007: monior revealed sinus tach. vs EAT; Echo 10/07: EF 70%  . Varicose veins    eval. by vascular surgeon and procedure scheduled to correct   Past Surgical History:  Procedure Laterality Date  . CESAREAN SECTION  2009  . DIAGNOSTIC LAPAROSCOPY  2010   scar tissue  . KNEE SURGERY  04/10/2010  . LUMBAR LAMINECTOMY/DECOMPRESSION MICRODISCECTOMY Right 09/28/2013   Procedure: LUMBAR LAMINECTOMY/DECOMPRESSION MICRODISCECTOMY 1 LEVEL,LUMBAR  THREE-FOUR RIGHT ;  Surgeon: Charlie Pitter, MD;  Location: [facility];  Service: Neurosurgery;  Laterality: Right;  right  . MYRINGOTOMY     Social History   Tobacco Use  . Smoking status: Never Smoker  . Smokeless tobacco: Never Used  Substance Use Topics  . Alcohol use: Yes    Alcohol/week: 0.0 standard drinks    Comment: occasional to rare  . Drug use: No   Family History  Problem Relation Age of Onset  . Hypertension Mother   . ADD / ADHD Mother   . Coronary artery disease Other        grandfather  . Heart attack Other        grandfather  . Breast cancer Other        grandmother  . Bipolar disorder Other        grandmother  . ADD / ADHD Sister   . COPD Maternal Grandfather   . Breast cancer Maternal Grandmother 70   Allergies  Allergen Reactions  . Covid-19 Mrna Vacc (Moderna) Anaphylaxis    WAS GIVEN THE PFIZER NOT MODERNA  . Shellfish Allergy     Questionable - did have rash once    Current Outpatient Medications on File Prior to Visit  Medication Sig Dispense Refill  . clonazePAM (KLONOPIN) 0.5 MG tablet Take 1-2 tablets by mouth daily as needed.    Marland Kitchen EPINEPHrine 0.3 mg/0.3 mL IJ SOAJ injection Inject 0.3 mLs (0.3 mg  total) into the muscle as needed for anaphylaxis. 1 each 1  . fluticasone (FLONASE) 50 MCG/ACT nasal spray Place 2 sprays into both nostrils daily as needed for allergies.     . folic acid (FOLVITE) 1 MG tablet Take 1 mg by mouth daily.    . hydrOXYzine (ATARAX/VISTARIL) 50 MG tablet Take 1-2 tablets (50-100 mg total)  by mouth at bedtime as needed. 180 tablet 1  . ibuprofen (ADVIL,MOTRIN) 600 MG tablet Take by mouth.    . Melatonin 3 MG CAPS Take 2 capsules by mouth daily as needed.     . Norethindrone-Ethinyl Estradiol-Fe Biphas (LO LOESTRIN FE) 1 MG-10 MCG / 10 MCG tablet Take 1 tablet by mouth daily.    . Omega-3 Fatty Acids (FISH OIL) 1000 MG CAPS Take 2 capsules by mouth daily.    . Probiotic Product (CULTURELLE PROBIOTICS PO) Take 1 tablet by mouth daily.    . sertraline (ZOLOFT) 50 MG tablet Take 50 mg by mouth daily.    Marland Kitchen tretinoin (RETIN-A) 0.025 % cream Apply topically 3 (three) times a week.      No current facility-administered medications on file prior to visit.    Review of Systems  Constitutional: Negative for activity change, appetite change, fatigue, fever and unexpected weight change.  HENT: Negative for congestion, ear pain, rhinorrhea, sinus pressure and sore throat.   Eyes: Negative for pain, redness and visual disturbance.  Respiratory: Negative for cough, shortness of breath and wheezing.   Cardiovascular: Negative for chest pain and palpitations.  Gastrointestinal: Negative for abdominal pain, blood in stool, constipation and diarrhea.  Endocrine: Negative for polydipsia and polyuria.  Genitourinary: Negative for dysuria, frequency and urgency.  Musculoskeletal: Negative for arthralgias, back pain and myalgias.  Skin: Negative for pallor and rash.  Allergic/Immunologic: Negative for environmental allergies.  Neurological: Negative for dizziness, syncope and headaches.  Hematological: Negative for adenopathy. Does not bruise/bleed easily.  Psychiatric/Behavioral:  Negative for decreased concentration and dysphoric mood. The patient is nervous/anxious.        Objective:   Physical Exam Constitutional:      General: She is not in acute distress.    Appearance: Normal appearance. She is well-developed. She is obese. She is not ill-appearing or diaphoretic.  HENT:     Head: Normocephalic and atraumatic.     Right Ear: Tympanic membrane, ear canal and external ear normal.     Left Ear: Tympanic membrane, ear canal and external ear normal.     Nose: Nose normal. No congestion.     Mouth/Throat:     Mouth: Mucous membranes are moist.     Pharynx: Oropharynx is clear. No posterior oropharyngeal erythema.  Eyes:     General: No scleral icterus.    Extraocular Movements: Extraocular movements intact.     Conjunctiva/sclera: Conjunctivae normal.     Pupils: Pupils are equal, round, and reactive to light.  Neck:     Thyroid: No thyromegaly.     Vascular: No carotid bruit or JVD.  Cardiovascular:     Rate and Rhythm: Normal rate and regular rhythm.     Pulses: Normal pulses.     Heart sounds: Normal heart sounds. No gallop.   Pulmonary:     Effort: Pulmonary effort is normal. No respiratory distress.     Breath sounds: Normal breath sounds. No wheezing.     Comments: Good air exch Chest:     Chest wall: No tenderness.  Abdominal:     General: Bowel sounds are normal. There is no distension or abdominal bruit.     Palpations: Abdomen is soft. There is no mass.     Tenderness: There is no abdominal tenderness.     Hernia: No hernia is present.  Genitourinary:    Comments: Breast and pelvic exam done by gyn Musculoskeletal:        General: No tenderness.  Normal range of motion.     Cervical back: Normal range of motion and neck supple. No rigidity. No muscular tenderness.     Right lower leg: No edema.     Left lower leg: No edema.  Lymphadenopathy:     Cervical: No cervical adenopathy.  Skin:    General: Skin is warm and dry.      Coloration: Skin is not pale.     Findings: No erythema or rash.  Neurological:     Mental Status: She is alert. Mental status is at baseline.     Cranial Nerves: No cranial nerve deficit.     Motor: No abnormal muscle tone.     Coordination: Coordination normal.     Gait: Gait normal.     Deep Tendon Reflexes: Reflexes are normal and symmetric. Reflexes normal.  Psychiatric:        Mood and Affect: Mood normal.        Cognition and Memory: Cognition and memory normal.        Judgment: Judgment normal.     Comments: Nl mood today  Voices being slightly anxious  Candidly discusses stressors            Assessment & Plan:   Problem List Items Addressed This Visit      Cardiovascular and Mediastinum   Inappropriate sinus tachycardia    Pt continues to take metoprolol prn  Anxiety worsens it        Relevant Medications   metoprolol tartrate (LOPRESSOR) 25 MG tablet     Other   Routine general medical examination at a health care facility - Primary    Reviewed health habits including diet and exercise and skin cancer prevention Reviewed appropriate screening tests for age  Also reviewed health mt list, fam hx and immunization status , as well as social and family history   See HPI Labs reviewed  Mammogram -will need 6 mo f/u ([facility])  Pt plans to f/u with gyn next mo  Also dermatology  tx low D-new      Obesity (BMI 30-39.9)    Discussed how this problem influences overall health and the risks it imposes  Reviewed plan for weight loss with lower calorie diet (via better food choices and also portion control or program like weight watchers) and exercise building up to or more than 30 minutes 5 days per week including some aerobic activity   5 lb loss- commended      Generalized anxiety disorder with panic attacks    Seeing Dr Nicolasa Ducking for psychiatry Taking zolofr and clonazepam  Good and bad days 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

## 2019-07-27 NOTE — Assessment & Plan Note (Signed)
Level in the 20 s - even with 5000 iu daily of D3 otc Px ergocalciferol 50,000 iu weekly for 12 weeks   Re check level 12 weeks Disc imp for bone and overall health

## 2019-07-27 NOTE — Patient Instructions (Addendum)
Either gyn of myself will need to put in an order in 6 months for diagnostic breast imaging  Call when it is time   Continue the 5000 iu of vitamin D Take the high dose weekly px D for 12 weeks  Then we will re check level   Continue follow up for psych care and get started with a counselor

## 2019-08-03 DIAGNOSIS — F902 Attention-deficit hyperactivity disorder, combined type: Secondary | ICD-10-CM | POA: Diagnosis not present

## 2019-08-03 DIAGNOSIS — F410 Panic disorder [episodic paroxysmal anxiety] without agoraphobia: Secondary | ICD-10-CM | POA: Diagnosis not present

## 2019-08-03 DIAGNOSIS — F411 Generalized anxiety disorder: Secondary | ICD-10-CM | POA: Diagnosis not present

## 2019-08-04 DIAGNOSIS — J3089 Other allergic rhinitis: Secondary | ICD-10-CM | POA: Diagnosis not present

## 2019-08-04 DIAGNOSIS — T781XXA Other adverse food reactions, not elsewhere classified, initial encounter: Secondary | ICD-10-CM | POA: Diagnosis not present

## 2019-08-04 DIAGNOSIS — T781XXD Other adverse food reactions, not elsewhere classified, subsequent encounter: Secondary | ICD-10-CM | POA: Diagnosis not present

## 2019-08-04 DIAGNOSIS — J301 Allergic rhinitis due to pollen: Secondary | ICD-10-CM | POA: Diagnosis not present

## 2019-08-04 DIAGNOSIS — Z03818 Encounter for observation for suspected exposure to other biological agents ruled out: Secondary | ICD-10-CM | POA: Diagnosis not present

## 2019-08-04 DIAGNOSIS — J3081 Allergic rhinitis due to animal (cat) (dog) hair and dander: Secondary | ICD-10-CM | POA: Diagnosis not present

## 2019-08-25 ENCOUNTER — Other Ambulatory Visit: Payer: Self-pay | Admitting: Obstetrics

## 2019-08-25 DIAGNOSIS — Z01419 Encounter for gynecological examination (general) (routine) without abnormal findings: Secondary | ICD-10-CM | POA: Diagnosis not present

## 2019-08-25 DIAGNOSIS — Z6833 Body mass index (BMI) 33.0-33.9, adult: Secondary | ICD-10-CM | POA: Diagnosis not present

## 2019-08-31 ENCOUNTER — Other Ambulatory Visit: Payer: Self-pay | Admitting: Psychiatry

## 2019-08-31 DIAGNOSIS — F902 Attention-deficit hyperactivity disorder, combined type: Secondary | ICD-10-CM | POA: Diagnosis not present

## 2019-08-31 DIAGNOSIS — F411 Generalized anxiety disorder: Secondary | ICD-10-CM | POA: Diagnosis not present

## 2019-08-31 DIAGNOSIS — F410 Panic disorder [episodic paroxysmal anxiety] without agoraphobia: Secondary | ICD-10-CM | POA: Diagnosis not present

## 2019-10-14 ENCOUNTER — Encounter: Payer: Self-pay | Admitting: Family Medicine

## 2019-10-14 DIAGNOSIS — R5382 Chronic fatigue, unspecified: Secondary | ICD-10-CM

## 2019-10-14 DIAGNOSIS — E559 Vitamin D deficiency, unspecified: Secondary | ICD-10-CM

## 2019-10-15 DIAGNOSIS — R5383 Other fatigue: Secondary | ICD-10-CM | POA: Insufficient documentation

## 2019-10-19 ENCOUNTER — Other Ambulatory Visit (INDEPENDENT_AMBULATORY_CARE_PROVIDER_SITE_OTHER): Payer: 59

## 2019-10-19 DIAGNOSIS — E559 Vitamin D deficiency, unspecified: Secondary | ICD-10-CM | POA: Diagnosis not present

## 2019-10-19 DIAGNOSIS — R5382 Chronic fatigue, unspecified: Secondary | ICD-10-CM | POA: Diagnosis not present

## 2019-10-19 LAB — CBC WITH DIFFERENTIAL/PLATELET
Basophils Absolute: 0 10*3/uL (ref 0.0–0.1)
Basophils Relative: 0.5 % (ref 0.0–3.0)
Eosinophils Absolute: 0.1 10*3/uL (ref 0.0–0.7)
Eosinophils Relative: 0.8 % (ref 0.0–5.0)
HCT: 37.3 % (ref 36.0–46.0)
Hemoglobin: 12.6 g/dL (ref 12.0–15.0)
Lymphocytes Relative: 26.4 % (ref 12.0–46.0)
Lymphs Abs: 2.5 10*3/uL (ref 0.7–4.0)
MCHC: 33.8 g/dL (ref 30.0–36.0)
MCV: 87.9 fl (ref 78.0–100.0)
Monocytes Absolute: 0.4 10*3/uL (ref 0.1–1.0)
Monocytes Relative: 4.6 % (ref 3.0–12.0)
Neutro Abs: 6.3 10*3/uL (ref 1.4–7.7)
Neutrophils Relative %: 67.7 % (ref 43.0–77.0)
Platelets: 252 10*3/uL (ref 150.0–400.0)
RBC: 4.24 Mil/uL (ref 3.87–5.11)
RDW: 12.7 % (ref 11.5–15.5)
WBC: 9.3 10*3/uL (ref 4.0–10.5)

## 2019-10-20 LAB — VITAMIN D 25 HYDROXY (VIT D DEFICIENCY, FRACTURES): VITD: 43.13 ng/mL (ref 30.00–100.00)

## 2019-10-29 DIAGNOSIS — F410 Panic disorder [episodic paroxysmal anxiety] without agoraphobia: Secondary | ICD-10-CM | POA: Diagnosis not present

## 2019-10-29 DIAGNOSIS — F411 Generalized anxiety disorder: Secondary | ICD-10-CM | POA: Diagnosis not present

## 2019-10-29 DIAGNOSIS — F902 Attention-deficit hyperactivity disorder, combined type: Secondary | ICD-10-CM | POA: Diagnosis not present

## 2019-11-15 DIAGNOSIS — F410 Panic disorder [episodic paroxysmal anxiety] without agoraphobia: Secondary | ICD-10-CM | POA: Diagnosis not present

## 2019-11-15 DIAGNOSIS — F411 Generalized anxiety disorder: Secondary | ICD-10-CM | POA: Diagnosis not present

## 2019-11-15 DIAGNOSIS — F902 Attention-deficit hyperactivity disorder, combined type: Secondary | ICD-10-CM | POA: Diagnosis not present

## 2019-11-16 ENCOUNTER — Encounter: Payer: Self-pay | Admitting: Family Medicine

## 2019-11-17 ENCOUNTER — Encounter: Payer: Self-pay | Admitting: Family Medicine

## 2019-11-17 ENCOUNTER — Ambulatory Visit: Payer: 59 | Admitting: Family Medicine

## 2019-11-17 ENCOUNTER — Other Ambulatory Visit: Payer: Self-pay

## 2019-11-17 VITALS — BP 116/78 | HR 82 | Temp 97.0°F | Ht 67.5 in | Wt 222.1 lb

## 2019-11-17 DIAGNOSIS — F411 Generalized anxiety disorder: Secondary | ICD-10-CM

## 2019-11-17 DIAGNOSIS — F410 Panic disorder [episodic paroxysmal anxiety] without agoraphobia: Secondary | ICD-10-CM

## 2019-11-17 DIAGNOSIS — R000 Tachycardia, unspecified: Secondary | ICD-10-CM | POA: Diagnosis not present

## 2019-11-17 NOTE — Assessment & Plan Note (Signed)
This has worsened lately  ? If also irregular rhythm (normal today)  occ chest discomfort  Poss anxiety trigger-unsure why  Using relaxation techniques Adv to try 1 1/2 metoprolol at bedtime instead of 1 (25 mg)  Ref made to cardiology (has seen Dr Fletcher Anon in the past)  May consider monitor  Past cardiac visit and echo reviewed Enc pt to continue current anx medications and also caffeine avoidance

## 2019-11-17 NOTE — Telephone Encounter (Signed)
Carrie Scheduled appt today for pt to be checked out

## 2019-11-17 NOTE — Progress Notes (Signed)
Subjective:    Patient ID: Kimberly Torres, female    DOB: 07/11/1980, 39 y.o.   MRN: [id number]  This visit occurred during the SARS-CoV-2 public health emergency.  Safety protocols were in place, including screening questions prior to the visit, additional usage of staff PPE, and extensive cleaning of exam room while observing appropriate contact time as indicated for disinfecting solutions.    HPI Pt presents with h/o tachycardia (primarily at night)   Wt Readings from Last 3 Encounters:  11/17/19 222 lb 1 oz (100.7 kg)  07/27/19 226 lb 1 oz (102.5 kg)  06/11/19 231 lb 7 oz (105 kg)   34.27 kg/m  This started about 2 weeks ago  No life changes - no new anxiety/stress/caffeine  Not fasting/ diet is the same   Exercise is walking (fast pace)  Usually pulse goes up to low 100s   (without the beta blocker)   Wakes up in middle of the night  (? Due to rapid HR) Does not feel anxious Feels heart pounding (that makes her anxious) -then panic   Chest will feel twingy (not pain or pressure) perhaps sore  Perhaps a little sob /aware of breathing  Feels like she skips beats just during episodes    Has a new apple watch- EKG has shown sinus tach (not a fib)    Saturday- HR was 115-120s lying in bed - lasted about 4 hours  She took some metoprolol and a klonopin Did guided meditation and eventually slowed it down   She continues to do the meditation (headspace)   EKG today NSR with rate of 83 and no acute changes     BP Readings from Last 3 Encounters:  11/17/19 116/78  07/27/19 122/78  06/18/19 116/72   Pulse Readings from Last 3 Encounters:  11/17/19 82  07/27/19 89  06/18/19 91     Pt has h/o sinus tach in the past and has taken metoprolol prn for this -now taking bid regularly  She has seen cardiology Dr Fletcher Anon for this in 2015 Does avoid caffeine  Takes OC  Has h/o anxiety and panic attacks that are well controlled  Klonopin  Hydroxyzine    zoloft  Sees counselor - and that is helpful / has started practicing mindfulness   2D echocardiogram then- reviewed:  2D Echocardiogram without contrast Order #: [id number] Accession #: [id number] Patient Info  Patient name: Kimberly Torres  MRN: [id number]  Age: 39 y.o.  Sex: female  MyChart Results Release  MyChart Status: Active Results Release  Vitals  BP Height Weight BSA (Calculated - sq m)  122/78 5\' 7"  (1.702 m) 158 lb 12 oz (72 kg) 1.84 sq meters   2D Echocardiogram without contrast: Result Notes  Older Notes     Notes Recorded by Tracie Harrier, RN on 04/23/2014 at 4:18 PM Reviewed results with patient.   ------  Notes Recorded by Wellington Hampshire, MD on 04/22/2014 at 11:11 AM Inform patient that echo was normal.      Result Narrative              *[facility]             [place], [place] [address]               [phone]   -------------------------------------------------------------------  Transthoracic Echocardiography   Patient:  [name], [name]  MR #:  [id number]  Study Date: 04/21/2014  Gender:   F  Age:    33  Height:   170.2 cm  Weight:   68.9 kg  BSA:    1.81 m^2  Pt. Status:  Room:   ATTENDING  Kathlyn Sacramento, MD  ORDERING   Kathlyn Sacramento, MD  REFERRING  Kathlyn Sacramento, MD  SONOGRAPHER Timmothy Sours, RDCS, RVT  PERFORMING  [facility] [place]   cc:   -------------------------------------------------------------------  LV EF: 60% -  65%   -------------------------------------------------------------------  History:  PMH: Acquired from the patient and from the patient&'s  chart. Palpitations, lightheadedness, and dyspnea.   -------------------------------------------------------------------  Study Conclusions   - Left ventricle: The cavity size was normal. Systolic function was   normal. The estimated ejection fraction was in the range of 60%  to 65%. Wall motion was normal; there were no regional wall  motion abnormalities. Left ventricular diastolic function  parameters were normal.  - Right ventricle: Systolic function was normal.  - Pulmonary arteries: Systolic pressure was within the normal  range.   Impressions:   - Normal study.   Reassuring echo  Patient Active Problem List   Diagnosis Date Noted  . Fatigue 10/15/2019  . Vitamin D deficiency 07/27/2019  . Breast asymmetry 07/27/2019  . Generalized anxiety disorder with panic attacks 07/13/2019  . Immunization reaction 06/11/2019  . Obesity (BMI 30-39.9) 05/12/2017  . 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 HNP (herniated nucleus pulposus), lumbar 09/28/2013  . 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 Cluster headache 02/15/2012  . Routine general medical examination at a health care facility 01/28/2012  . Sinus tachycardia 09/11/2010  . HEMANGIOMA, HEPATIC 03/14/2009  . Attention deficit hyperactivity disorder (ADHD) 06/25/2007  . SCOLIOSIS 06/25/2007   Past Medical History:  Diagnosis Date  . Acne    sees derm- on accutane  . Anxiety   . Chondromalacia of right knee    right  . Depression   . Dysrhythmia    hx SVT  . H/O seasonal allergies   . Headache(784.0)    cluster migraines  . Insomnia   . Sinus tachycardia    eval by Dr. Stanford Breed in 2007: monior revealed sinus tach. vs EAT; Echo 10/07: EF 70%  . Varicose veins    eval. by vascular surgeon and procedure scheduled to correct   Past Surgical History:  Procedure Laterality Date  . CESAREAN SECTION  2009  . DIAGNOSTIC LAPAROSCOPY  2010   scar tissue  . KNEE SURGERY  04/10/2010  . LUMBAR LAMINECTOMY/DECOMPRESSION MICRODISCECTOMY Right 09/28/2013   Procedure: LUMBAR LAMINECTOMY/DECOMPRESSION MICRODISCECTOMY 1 LEVEL,LUMBAR  THREE-FOUR RIGHT ;  Surgeon: Charlie Pitter, MD;  Location: [facility];   Service: Neurosurgery;  Laterality: Right;  right  . MYRINGOTOMY     Social History   Tobacco Use  . Smoking status: Never Smoker  . Smokeless tobacco: Never Used  Substance Use Topics  . Alcohol use: Yes    Alcohol/week: 0.0 standard drinks    Comment: occasional to rare  . Drug use: No   Family History  Problem Relation Age of Onset  . Hypertension Mother   . ADD / ADHD Mother   . Coronary artery disease Other        grandfather  . Heart attack Other        grandfather  . Breast cancer Other        grandmother  . Bipolar disorder Other        grandmother  . ADD /  ADHD Sister   . COPD Maternal Grandfather   . Breast cancer Maternal Grandmother 70   Allergies  Allergen Reactions  . Covid-19 Mrna Vacc (Moderna) Anaphylaxis    WAS GIVEN THE PFIZER NOT MODERNA   Current Outpatient Medications on File Prior to Visit  Medication Sig Dispense Refill  . Cholecalciferol (VITAMIN D3) 1.25 MG (50000 UT) TABS Take 1 capsule by mouth daily.    . clonazePAM (KLONOPIN) 0.5 MG tablet Take 1-2 tablets by mouth daily as needed.    Marland Kitchen EPINEPHrine 0.3 mg/0.3 mL IJ SOAJ injection Inject 0.3 mLs (0.3 mg total) into the muscle as needed for anaphylaxis. 1 each 1  . fluticasone (FLONASE) 50 MCG/ACT nasal spray Place 2 sprays into both nostrils daily as needed for allergies.     . folic acid (FOLVITE) 1 MG tablet Take 1 mg by mouth daily.    . hydrOXYzine (ATARAX/VISTARIL) 50 MG tablet Take 1-2 tablets (50-100 mg total) by mouth at bedtime as needed. 180 tablet 1  . ibuprofen (ADVIL,MOTRIN) 600 MG tablet Take by mouth.    . Melatonin 3 MG CAPS Take 2 capsules by mouth daily as needed.     . metoprolol tartrate (LOPRESSOR) 25 MG tablet Take 1 tablet (25 mg total) by mouth 2 (two) times daily as needed. As needed for elevated blood pressure, headache or high pulse rate 180 tablet 3  . Norethindrone-Ethinyl Estradiol-Fe Biphas (LO LOESTRIN FE) 1 MG-10 MCG / 10 MCG tablet Take 1 tablet by mouth  daily.    . Omega-3 Fatty Acids (FISH OIL) 1000 MG CAPS Take 2 capsules by mouth daily.    . Probiotic Product (CULTURELLE PROBIOTICS PO) Take 1 tablet by mouth daily.    . sertraline (ZOLOFT) 50 MG tablet Take 100 mg by mouth daily.     Marland Kitchen tretinoin (RETIN-A) 0.025 % cream Apply topically 3 (three) times a week.      No current facility-administered medications on file prior to visit.    Review of Systems  Constitutional: Negative for activity change, appetite change, fatigue, fever and unexpected weight change.  HENT: Negative for congestion, ear pain, rhinorrhea, sinus pressure and sore throat.   Eyes: Negative for pain, redness and visual disturbance.  Respiratory: Negative for cough, shortness of breath and wheezing.   Cardiovascular: Positive for palpitations. Negative for chest pain and leg swelling.  Gastrointestinal: Negative for abdominal pain, blood in stool, constipation and diarrhea.  Endocrine: Negative for polydipsia and polyuria.  Genitourinary: Negative for dysuria, frequency and urgency.  Musculoskeletal: Negative for arthralgias, back pain and myalgias.  Skin: Negative for pallor and rash.  Allergic/Immunologic: Negative for environmental allergies.  Neurological: Negative for dizziness, syncope and headaches.  Hematological: Negative for adenopathy. Does not bruise/bleed easily.  Psychiatric/Behavioral: Positive for sleep disturbance. Negative for decreased concentration and dysphoric mood. The patient is nervous/anxious.        Objective:   Physical Exam Constitutional:      General: She is not in acute distress.    Appearance: Normal appearance. She is well-developed. She is obese. She is not ill-appearing or diaphoretic.  HENT:     Head: Normocephalic and atraumatic.     Mouth/Throat:     Mouth: Mucous membranes are moist.  Eyes:     General: No scleral icterus.    Conjunctiva/sclera: Conjunctivae normal.     Pupils: Pupils are equal, round, and reactive to  light.  Neck:     Thyroid: No thyromegaly.     Vascular: No  carotid bruit or JVD.  Cardiovascular:     Rate and Rhythm: Normal rate and regular rhythm.     Pulses: Normal pulses.     Heart sounds: Normal heart sounds. No murmur heard.  No gallop.   Pulmonary:     Effort: Pulmonary effort is normal. No respiratory distress.     Breath sounds: Normal breath sounds. No wheezing or rales.  Chest:     Chest wall: No tenderness.  Abdominal:     General: Bowel sounds are normal. There is no distension or abdominal bruit.     Palpations: Abdomen is soft. There is no mass.     Tenderness: There is no abdominal tenderness.  Musculoskeletal:     Cervical back: Normal range of motion and neck supple.     Right lower leg: No edema.     Left lower leg: No edema.     Comments: No pedal edema or tenderness or palp cords  Lymphadenopathy:     Cervical: No cervical adenopathy.  Skin:    General: Skin is warm and dry.     Coloration: Skin is not pale.     Findings: No erythema or rash.  Neurological:     Mental Status: She is alert.     Sensory: No sensory deficit.     Motor: No tremor.     Coordination: Coordination normal.     Deep Tendon Reflexes: Reflexes are normal and symmetric. Reflexes normal.  Psychiatric:        Mood and Affect: Mood normal.     Comments: Pleasant and talkative Attentive  Not anxious today           Assessment & Plan:   Problem List Items Addressed This Visit      Cardiovascular and Mediastinum   Inappropriate sinus tachycardia - Primary    This has worsened lately  ? If also irregular rhythm (normal today)  occ chest discomfort  Poss anxiety trigger-unsure why  Using relaxation techniques Adv to try 1 1/2 metoprolol at bedtime instead of 1 (25 mg)  Ref made to cardiology (has seen Dr Fletcher Anon in the past)  May consider monitor  Past cardiac visit and echo reviewed Enc pt to continue current anx medications and also caffeine avoidance        Relevant Orders   Ambulatory referral to Cardiology     Other   Generalized anxiety disorder with panic attacks    Per pt this has been stable w/o recent change in stress  Is having middle of the night tachycardia-unsure if related   Enc to continue meditation and relaxation techniques       Other Visit Diagnoses    Tachycardia       Relevant Orders   EKG 12-Lead (Completed)

## 2019-11-17 NOTE — Assessment & Plan Note (Signed)
Per pt this has been stable w/o recent change in stress  Is having middle of the night tachycardia-unsure if related   Enc to continue meditation and relaxation techniques

## 2019-11-17 NOTE — Patient Instructions (Addendum)
Start increasing the metoprolol to 1 1/2 pills for pm dose   Keep doing your meditation  EKG and pulse are re assuring today   I will place cardiology referral  [place] office will you about that   If your watch signals you re: abnormal rhythm - let us know

## 2019-11-30 ENCOUNTER — Ambulatory Visit (INDEPENDENT_AMBULATORY_CARE_PROVIDER_SITE_OTHER): Payer: 59

## 2019-11-30 ENCOUNTER — Other Ambulatory Visit: Payer: Self-pay

## 2019-11-30 ENCOUNTER — Ambulatory Visit: Payer: 59 | Admitting: Cardiology

## 2019-11-30 ENCOUNTER — Encounter: Payer: Self-pay | Admitting: Cardiology

## 2019-11-30 VITALS — BP 122/80 | HR 76 | Ht 68.0 in | Wt 225.0 lb

## 2019-11-30 DIAGNOSIS — F419 Anxiety disorder, unspecified: Secondary | ICD-10-CM | POA: Diagnosis not present

## 2019-11-30 DIAGNOSIS — R002 Palpitations: Secondary | ICD-10-CM

## 2019-11-30 NOTE — Progress Notes (Signed)
Cardiology Office Note:    Date:  11/30/2019   ID:  Kimberly Torres, DOB 23-May-1981, MRN [id number]  PCP:  Abner Greenspan, MD  [facility] Cardiologist:  Kate Sable, MD  [place] Electrophysiologist:  None   Referring MD: Abner Greenspan, MD   Chief Complaint  Patient presents with  . other    Tachycardia. Meds reviewed verbally with pt.   Kimberly Torres is a 39 y.o. female who is being seen today for the evaluation of tachycardia at the request of Tower, Wynelle Fanny, MD.   History of Present Illness:    Kimberly Torres is a 39 y.o. female with a hx of SVT, anxiety who presents due to palpitations.  Patient states currently episodes of palpitations over the past 3 to 4 weeks.  Symptoms usually happen early in the morning or above 2 AM.  Symptoms sometimes last for an hour or so.  She checks her heart rate with her smart watch and notices heart rates of 115-120s.  She denies any history of heart disease.  She states having similar episodes in the past back in 2015, but episodes occurred during the day.  She gets short of breath with palpitations.  Denies chest pain.  Denies dizziness or syncope.  She was previously given atenolol with subtle help with her symptoms.  Recently, she was given metoprolol tartrate to take as needed.  She has been taking the metoprolol constantly over the past 4 weeks.  Echocardiogram in 2015 showed normal systolic and diastolic function, EF 60 to 65%.  Past Medical History:  Diagnosis Date  . Acne    sees derm- on accutane  . Anxiety   . Chondromalacia of right knee    right  . Depression   . Dysrhythmia    hx SVT  . H/O seasonal allergies   . Headache(784.0)    cluster migraines  . Insomnia   . Sinus tachycardia    eval by Dr. Stanford Breed in 2007: monior revealed sinus tach. vs EAT; Echo 10/07: EF 70%  . Varicose veins    eval. by vascular surgeon and procedure scheduled to correct    Past Surgical  History:  Procedure Laterality Date  . CESAREAN SECTION  2009  . DIAGNOSTIC LAPAROSCOPY  2010   scar tissue  . KNEE SURGERY  04/10/2010  . LUMBAR LAMINECTOMY/DECOMPRESSION MICRODISCECTOMY Right 09/28/2013   Procedure: LUMBAR LAMINECTOMY/DECOMPRESSION MICRODISCECTOMY 1 LEVEL,LUMBAR  THREE-FOUR RIGHT ;  Surgeon: Charlie Pitter, MD;  Location: [facility];  Service: Neurosurgery;  Laterality: Right;  right  . MYRINGOTOMY      Current Medications: Current Meds  Medication Sig  . Cholecalciferol (DIALYVITE VITAMIN D 5000 PO) Take by mouth daily.  . clonazePAM (KLONOPIN) 0.5 MG tablet Take 1-2 tablets by mouth daily as needed.  Marland Kitchen EPINEPHrine 0.3 mg/0.3 mL IJ SOAJ injection Inject 0.3 mLs (0.3 mg total) into the muscle as needed for anaphylaxis.  . fluticasone (FLONASE) 50 MCG/ACT nasal spray Place 2 sprays into both nostrils daily as needed for allergies.   . folic acid (FOLVITE) 1 MG tablet Take 1 mg by mouth daily.  . hydrOXYzine (ATARAX/VISTARIL) 50 MG tablet Take 1-2 tablets (50-100 mg total) by mouth at bedtime as needed.  . Melatonin 3 MG CAPS Take 2 capsules by mouth daily as needed.   . metoprolol tartrate (LOPRESSOR) 25 MG tablet Take 1 tablet (25 mg total) by mouth 2 (two) times daily as needed. As needed for elevated blood pressure,  headache or high pulse rate  . Norethindrone-Ethinyl Estradiol-Fe Biphas (LO LOESTRIN FE) 1 MG-10 MCG / 10 MCG tablet Take 1 tablet by mouth daily.  . Omega-3 Fatty Acids (FISH OIL) 1000 MG CAPS Take 2 capsules by mouth daily.  . Probiotic Product (CULTURELLE PROBIOTICS PO) Take 1 tablet by mouth daily.  . sertraline (ZOLOFT) 50 MG tablet Take 100 mg by mouth daily.   Marland Kitchen tretinoin (RETIN-A) 0.025 % cream Apply topically 3 (three) times a week.      Allergies:   Covid-19 mrna vacc (moderna)   Social History   Socioeconomic History  . Marital status: Married    Spouse name: Not on file  . Number of children: Not on file  . Years of education: Not on  file  . Highest education level: Not on file  Occupational History  . Occupation: Marine scientist: [employer]  Tobacco Use  . Smoking status: Never Smoker  . Smokeless tobacco: Never Used  Substance and Sexual Activity  . Alcohol use: Yes    Alcohol/week: 0.0 Torres drinks    Comment: occasional to rare  . Drug use: No  . Sexual activity: Yes    Partners: Male    Birth control/protection: None  Other Topics Concern  . Not on file  Social History Narrative   Family History:   GF CAD with MI in his 61s   MGM breast cancer in her 31s    GM with bipolar      Social History:   Marital Status: Married   Children: 1   Occupation: Software engineer   Her husband is a Engineer, water         Social Determinants of Radio broadcast assistant Strain:   . Difficulty of Paying Living Expenses:   Food Insecurity:   . Worried About Charity fundraiser in the Last Year:   . Arboriculturist in the Last Year:   Transportation Needs:   . Film/video editor (Medical):   Marland Kitchen Lack of Transportation (Non-Medical):   Physical Activity:   . Days of Exercise per Week:   . Minutes of Exercise per Session:   Stress:   . Feeling of Stress :   Social Connections:   . Frequency of Communication with Friends and Family:   . Frequency of Social Gatherings with Friends and Family:   . Attends Religious Services:   . Active Member of Clubs or Organizations:   . Attends Archivist Meetings:   Marland Kitchen Marital Status:      Family History: The patient's family history includes ADD / ADHD in her mother and sister; Bipolar disorder in an other family member; Breast cancer in an other family member; Breast cancer (age of onset: 20) in her maternal grandmother; COPD in her maternal grandfather; Coronary artery disease in an other family member; Heart attack in an other family member; Hypertension in her mother.  ROS:   Please see the history of present illness.     All other  systems reviewed and are negative.  EKGs/Labs/Other Studies Reviewed:    The following studies were reviewed today:   EKG:  EKG is  ordered today.  The ekg ordered today demonstrates normal sinus rhythm, normal ECG.  Recent Labs: 07/22/2019: ALT 13; BUN 10; Creat 0.92; Potassium 3.8; Sodium 138; TSH 3.70 10/19/2019: Hemoglobin 12.6; Platelets 252.0  Recent Lipid Panel    Component Value Date/Time   CHOL 153 07/22/2019 1512  TRIG 121 07/22/2019 1512   HDL 50 07/22/2019 1512   CHOLHDL 3.1 07/22/2019 1512   VLDL 16.6 07/18/2018 0820   LDLCALC 81 07/22/2019 1512    Physical Exam:    VS:  BP 122/80 (BP Location: Right Arm, Patient Position: Sitting, Cuff Size: Normal)   Pulse 76   Ht 5\' 8"  (1.727 m)   Wt 225 lb (102.1 kg)   SpO2 98%   BMI 34.21 kg/m     Wt Readings from Last 3 Encounters:  11/30/19 225 lb (102.1 kg)  11/17/19 222 lb 1 oz (100.7 kg)  07/27/19 226 lb 1 oz (102.5 kg)     GEN:  Well nourished, well developed in no acute distress HEENT: Normal NECK: No JVD; No carotid bruits LYMPHATICS: No lymphadenopathy CARDIAC: RRR, no murmurs, rubs, gallops RESPIRATORY:  Clear to auscultation without rales, wheezing or rhonchi  ABDOMEN: Soft, non-tender, non-distended MUSCULOSKELETAL:  No edema; No deformity  SKIN: Warm and dry NEUROLOGIC:  Alert and oriented x 3 PSYCHIATRIC:  Normal affect   ASSESSMENT:    1. Palpitations   2. Anxiety    PLAN:    In order of problems listed above:  1. Patient with a 4-week history of palpitations.  She had similar episodes in the past.  Anxiety could be contributing to her symptoms.  We will place a cardiac monitor for 1 week to evaluate for any significant arrhythmias.  Conclusions normal, we will consider switch from Lopressor to atenolol if patient is still symptomatic of status has worked in the past.  Previous echo showed normal systolic and diastolic function. 2. Patient with history of elevated.  Continue current  uncontrolled anxiety meds as prescribed by PCP.  Follow-up after cardiac monitor.   Medication Adjustments/Labs and Tests Ordered: Current medicines are reviewed at length with the patient today.  Concerns regarding medicines are outlined above.  Orders Placed This Encounter  Procedures  . LONG TERM MONITOR (3-14 DAYS)  . EKG 12-Lead   No orders of the defined types were placed in this encounter.   Patient Instructions  Medication Instructions:   Your physician recommends that you continue on your current medications as directed. Please refer to the Current Medication list given to you today.  *If you need a refill on your cardiac medications before your next appointment, please call your pharmacy*   Lab Work: None Ordered. If you have labs (blood work) drawn today and your tests are completely normal, you will receive your results only by: Marland Kitchen MyChart Message (if you have MyChart) OR . A paper copy in the mail If you have any lab test that is abnormal or we need to change your treatment, we will call you to review the results.   Testing/Procedures:  Your physician has recommended that you wear a Zio monitor. This monitor is a medical device that records the heart's electrical activity. Doctors most often use these monitors to diagnose arrhythmias. Arrhythmias are problems with the speed or rhythm of the heartbeat. The monitor is a small device applied to your chest. You can wear one while you do your normal daily activities. While wearing this monitor if you have any symptoms to push the button and record what you felt. Once you have worn this monitor for the period of time provider prescribed (Usually 14 days), you will return the monitor device in the postage paid box. Once it is returned they will download the data collected and provide [place] with a report which the provider will  then review and we will call you with those results. Important tips:  1. Avoid showering during the  first 24 hours of wearing the monitor. 2. Avoid excessive sweating to help maximize wear time. 3. Do not submerge the device, no hot tubs, and no swimming pools. 4. Keep any lotions or oils away from the patch. 5. After 24 hours you may shower with the patch on. Take brief showers with your back facing the shower head.  6. Do not remove patch once it has been placed because that will interrupt data and decrease adhesive wear time. 7. Push the button when you have any symptoms and write down what you were feeling. 8. Once you have completed wearing your monitor, remove and place into box which has postage paid and place in your outgoing mailbox.  9. If for some reason you have misplaced your box then call our office and we can provide another box and/or mail it off for you.         Follow-Up: At [facility], you and your health needs are our priority.  As part of our continuing mission to provide you with exceptional heart care, we have created designated Provider Care Teams.  These Care Teams include your primary Cardiologist (physician) and Advanced Practice Providers (APPs -  Physician Assistants and Nurse Practitioners) who all work together to provide you with the care you need, when you need it.  We recommend signing up for the patient portal called "MyChart".  Sign up information is provided on this After Visit Summary.  MyChart is used to connect with patients for Virtual Visits (Telemedicine).  Patients are able to view lab/test results, encounter notes, upcoming appointments, etc.  Non-urgent messages can be sent to your provider as well.   To learn more about what you can do with MyChart, go to [email or web address].    Your next appointment:   4 week(s)  The format for your next appointment:   In Person  Provider:   Kate Sable, MD   Other Instructions N/A     Signed, Kate Sable, MD  11/30/2019 1:10 PM    [place]

## 2019-11-30 NOTE — Patient Instructions (Signed)
Medication Instructions:   Your physician recommends that you continue on your current medications as directed. Please refer to the Current Medication list given to you today.  *If you need a refill on your cardiac medications before your next appointment, please call your pharmacy*   Lab Work: None Ordered. If you have labs (blood work) drawn today and your tests are completely normal, you will receive your results only by: Marland Kitchen MyChart Message (if you have MyChart) OR . A paper copy in the mail If you have any lab test that is abnormal or we need to change your treatment, we will call you to review the results.   Testing/Procedures:  Your physician has recommended that you wear a Zio monitor. This monitor is a medical device that records the heart's electrical activity. Doctors most often use these monitors to diagnose arrhythmias. Arrhythmias are problems with the speed or rhythm of the heartbeat. The monitor is a small device applied to your chest. You can wear one while you do your normal daily activities. While wearing this monitor if you have any symptoms to push the button and record what you felt. Once you have worn this monitor for the period of time provider prescribed (Usually 14 days), you will return the monitor device in the postage paid box. Once it is returned they will download the data collected and provide [place] with a report which the provider will then review and we will call you with those results. Important tips:  1. Avoid showering during the first 24 hours of wearing the monitor. 2. Avoid excessive sweating to help maximize wear time. 3. Do not submerge the device, no hot tubs, and no swimming pools. 4. Keep any lotions or oils away from the patch. 5. After 24 hours you may shower with the patch on. Take brief showers with your back facing the shower head.  6. Do not remove patch once it has been placed because that will interrupt data and decrease adhesive wear  time. 7. Push the button when you have any symptoms and write down what you were feeling. 8. Once you have completed wearing your monitor, remove and place into box which has postage paid and place in your outgoing mailbox.  9. If for some reason you have misplaced your box then call our office and we can provide another box and/or mail it off for you.         Follow-Up: At [facility], you and your health needs are our priority.  As part of our continuing mission to provide you with exceptional heart care, we have created designated Provider Care Teams.  These Care Teams include your primary Cardiologist (physician) and Advanced Practice Providers (APPs -  Physician Assistants and Nurse Practitioners) who all work together to provide you with the care you need, when you need it.  We recommend signing up for the patient portal called "MyChart".  Sign up information is provided on this After Visit Summary.  MyChart is used to connect with patients for Virtual Visits (Telemedicine).  Patients are able to view lab/test results, encounter notes, upcoming appointments, etc.  Non-urgent messages can be sent to your provider as well.   To learn more about what you can do with MyChart, go to [email or web address].    Your next appointment:   4 week(s)  The format for your next appointment:   In Person  Provider:   Kate Sable, MD   Other Instructions N/A

## 2019-12-04 ENCOUNTER — Encounter: Payer: Self-pay | Admitting: Family Medicine

## 2019-12-04 ENCOUNTER — Other Ambulatory Visit: Payer: Self-pay | Admitting: Family Medicine

## 2019-12-04 DIAGNOSIS — R928 Other abnormal and inconclusive findings on diagnostic imaging of breast: Secondary | ICD-10-CM

## 2019-12-04 DIAGNOSIS — N6489 Other specified disorders of breast: Secondary | ICD-10-CM

## 2019-12-14 ENCOUNTER — Other Ambulatory Visit: Payer: Self-pay | Admitting: Psychiatry

## 2019-12-14 DIAGNOSIS — F410 Panic disorder [episodic paroxysmal anxiety] without agoraphobia: Secondary | ICD-10-CM | POA: Diagnosis not present

## 2019-12-14 DIAGNOSIS — F39 Unspecified mood [affective] disorder: Secondary | ICD-10-CM | POA: Diagnosis not present

## 2019-12-14 DIAGNOSIS — F411 Generalized anxiety disorder: Secondary | ICD-10-CM | POA: Diagnosis not present

## 2019-12-29 DIAGNOSIS — R002 Palpitations: Secondary | ICD-10-CM | POA: Diagnosis not present

## 2020-01-04 ENCOUNTER — Telehealth: Payer: Self-pay

## 2020-01-04 NOTE — Telephone Encounter (Signed)
Patient called stating health at work told her to give our office a call regarding the [place]. Patient had an anaphylaxis reaction to the [place] vaccine on 05/31/20. Patient was taken to [facility] for this reaction. She was given epi, prednisone & benadryl.    Patient can be reached at [phone]

## 2020-01-04 NOTE — Telephone Encounter (Signed)
Let's do COVID component testing on her.  Salvatore Marvel, MD [facility] [place]

## 2020-01-05 ENCOUNTER — Ambulatory Visit: Payer: 59 | Admitting: Cardiovascular Disease

## 2020-01-05 ENCOUNTER — Encounter: Payer: Self-pay | Admitting: Cardiology

## 2020-01-05 ENCOUNTER — Other Ambulatory Visit: Payer: Self-pay

## 2020-01-05 ENCOUNTER — Ambulatory Visit (INDEPENDENT_AMBULATORY_CARE_PROVIDER_SITE_OTHER): Payer: 59 | Admitting: Cardiology

## 2020-01-05 VITALS — BP 110/80 | HR 80 | Ht 68.0 in | Wt 228.2 lb

## 2020-01-05 DIAGNOSIS — F419 Anxiety disorder, unspecified: Secondary | ICD-10-CM

## 2020-01-05 DIAGNOSIS — R002 Palpitations: Secondary | ICD-10-CM

## 2020-01-05 NOTE — Progress Notes (Signed)
Cardiology Office Note:    Date:  01/05/2020   ID:  Kimberly Torres, DOB 05-12-1981, MRN [id number]  PCP:  Abner Greenspan, MD  [facility] Cardiologist:  Kate Sable, MD  [place] Electrophysiologist:  None   Referring MD: Abner Greenspan, MD   Chief Complaint  Patient presents with  . office visit    F/U after wearing ZIO monitor; Meds verbally reviewed with patient.    History of Present Illness:    Kimberly Torres is a 39 y.o. female with a hx of SVT, anxiety who presents for follow-up.  She was last seen due to palpitations occurring over 3 to 4 weeks.  Cardiac monitor x 2 weeks was placed to evaluate for any significant arrhythmias.  She has no history of heart disease.  Patient states feeling the same, she has no concerns at this time.  She takes oral Lopressor as needed  Echocardiogram in 2015 showed normal systolic and diastolic function, EF 60 to 65%.  Past Medical History:  Diagnosis Date  . Acne    sees derm- on accutane  . Anxiety   . Chondromalacia of right knee    right  . Depression   . Dysrhythmia    hx SVT  . H/O seasonal allergies   . Headache(784.0)    cluster migraines  . Insomnia   . Sinus tachycardia    eval by Dr. Stanford Breed in 2007: monior revealed sinus tach. vs EAT; Echo 10/07: EF 70%  . Varicose veins    eval. by vascular surgeon and procedure scheduled to correct    Past Surgical History:  Procedure Laterality Date  . CESAREAN SECTION  2009  . DIAGNOSTIC LAPAROSCOPY  2010   scar tissue  . KNEE SURGERY  04/10/2010  . LUMBAR LAMINECTOMY/DECOMPRESSION MICRODISCECTOMY Right 09/28/2013   Procedure: LUMBAR LAMINECTOMY/DECOMPRESSION MICRODISCECTOMY 1 LEVEL,LUMBAR  THREE-FOUR RIGHT ;  Surgeon: Charlie Pitter, MD;  Location: [place] [facility];  Service: Neurosurgery;  Laterality: Right;  right  . MYRINGOTOMY      Current Medications: Current Meds  Medication Sig  . Cholecalciferol (DIALYVITE VITAMIN D 5000 PO) Take by  mouth daily.  Marland Kitchen EPINEPHrine 0.3 mg/0.3 mL IJ SOAJ injection Inject 0.3 mLs (0.3 mg total) into the muscle as needed for anaphylaxis.  . fluticasone (FLONASE) 50 MCG/ACT nasal spray Place 2 sprays into both nostrils daily as needed for allergies.   . folic acid (FOLVITE) 1 MG tablet Take 1 mg by mouth daily.  . hydrOXYzine (ATARAX/VISTARIL) 50 MG tablet Take 1-2 tablets (50-100 mg total) by mouth at bedtime as needed.  . Melatonin 3 MG CAPS Take 2 capsules by mouth daily as needed.   . metoprolol tartrate (LOPRESSOR) 25 MG tablet Take 1 tablet (25 mg total) by mouth 2 (two) times daily as needed. As needed for elevated blood pressure, headache or high pulse rate  . Norethindrone-Ethinyl Estradiol-Fe Biphas (LO LOESTRIN FE) 1 MG-10 MCG / 10 MCG tablet Take 1 tablet by mouth daily.  . Omega-3 Fatty Acids (FISH OIL) 1000 MG CAPS Take 2 capsules by mouth daily.  . sertraline (ZOLOFT) 50 MG tablet Take 100 mg by mouth daily.   Marland Kitchen tretinoin (RETIN-A) 0.025 % cream Apply topically 3 (three) times a week.      Allergies:   Covid-19 mrna vacc (moderna)   Social History   Socioeconomic History  . Marital status: Married    Spouse name: Not on file  . Number of children: Not on file  .  Years of education: Not on file  . Highest education level: Not on file  Occupational History  . Occupation: Marine scientist: [employer]  Tobacco Use  . Smoking status: Never Smoker  . Smokeless tobacco: Never Used  Vaping Use  . Vaping Use: Never used  Substance and Sexual Activity  . Alcohol use: Yes    Alcohol/week: 0.0 standard drinks    Comment: occasional to rare  . Drug use: No  . Sexual activity: Yes    Partners: Male    Birth control/protection: None  Other Topics Concern  . Not on file  Social History Narrative   Family History:   GF CAD with MI in his 85s   MGM breast cancer in her 87s    GM with bipolar      Social History:   Marital Status: Married   Children: 1   Occupation:  Software engineer   Her husband is a Engineer, water         Social Determinants of Radio broadcast assistant Strain:   . Difficulty of Paying Living Expenses:   Food Insecurity:   . Worried About Charity fundraiser in the Last Year:   . Arboriculturist in the Last Year:   Transportation Needs:   . Film/video editor (Medical):   Marland Kitchen Lack of Transportation (Non-Medical):   Physical Activity:   . Days of Exercise per Week:   . Minutes of Exercise per Session:   Stress:   . Feeling of Stress :   Social Connections:   . Frequency of Communication with Friends and Family:   . Frequency of Social Gatherings with Friends and Family:   . Attends Religious Services:   . Active Member of Clubs or Organizations:   . Attends Archivist Meetings:   Marland Kitchen Marital Status:      Family History: The patient's family history includes ADD / ADHD in her mother and sister; Bipolar disorder in an other family member; Breast cancer in an other family member; Breast cancer (age of onset: 55) in her maternal grandmother; COPD in her maternal grandfather; Coronary artery disease in an other family member; Heart attack in an other family member; Hypertension in her mother.  ROS:   Please see the history of present illness.     All other systems reviewed and are negative.  EKGs/Labs/Other Studies Reviewed:    The following studies were reviewed today:   EKG:  EKG not ordered today.   Recent Labs: 07/22/2019: ALT 13; BUN 10; Creat 0.92; Potassium 3.8; Sodium 138; TSH 3.70 10/19/2019: Hemoglobin 12.6; Platelets 252.0  Recent Lipid Panel    Component Value Date/Time   CHOL 153 07/22/2019 1512   TRIG 121 07/22/2019 1512   HDL 50 07/22/2019 1512   CHOLHDL 3.1 07/22/2019 1512   VLDL 16.6 07/18/2018 0820   LDLCALC 81 07/22/2019 1512    Physical Exam:    VS:  BP 110/80 (BP Location: Left Arm, Patient Position: Sitting, Cuff Size: Large)   Pulse 80   Ht 5\' 8"  (1.727 m)   Wt 228 lb  4 oz (103.5 kg)   SpO2 98%   BMI 34.71 kg/m     Wt Readings from Last 3 Encounters:  01/05/20 228 lb 4 oz (103.5 kg)  11/30/19 225 lb (102.1 kg)  11/17/19 222 lb 1 oz (100.7 kg)     GEN:  Well nourished, well developed in no acute distress HEENT: Normal  NECK: No JVD; No carotid bruits LYMPHATICS: No lymphadenopathy CARDIAC: RRR, no murmurs, rubs, gallops RESPIRATORY:  Clear to auscultation without rales, wheezing or rhonchi  ABDOMEN: Soft, non-tender, non-distended MUSCULOSKELETAL:  No edema; No deformity  SKIN: Warm and dry NEUROLOGIC:  Alert and oriented x 3 PSYCHIATRIC:  Normal affect   ASSESSMENT:    1. Palpitations   2. Anxiety    PLAN:    In order of problems listed above:  1. Patient with history of palpitations.  2-week cardiac monitor did not reveal any significant arrhythmias.  Anxiety could be contributing to her symptoms.  Patient reassured.  Recommend patient takes Lopressor twice daily as prescribed above his symptoms. 2. Patient with history of anxiety.  Continue current  anxiety meds as prescribed by PCP.  Follow-up prn  Total encounter time 35 minutes  Greater than 50% was spent in counseling and coordination of care with the patient    Medication Adjustments/Labs and Tests Ordered: Current medicines are reviewed at length with the patient today.  Concerns regarding medicines are outlined above.  No orders of the defined types were placed in this encounter.  No orders of the defined types were placed in this encounter.   Patient Instructions  Medication Instructions:  Your physician recommends that you continue on your current medications as directed. Please refer to the Current Medication list given to you today.  *If you need a refill on your cardiac medications before your next appointment, please call your pharmacy*   Lab Work: None Ordered If you have labs (blood work) drawn today and your tests are completely normal, you will receive  your results only by: Marland Kitchen MyChart Message (if you have MyChart) OR . A paper copy in the mail If you have any lab test that is abnormal or we need to change your treatment, we will call you to review the results.   Testing/Procedures: None Ordered   Follow-Up: At [facility], you and your health needs are our priority.  As part of our continuing mission to provide you with exceptional heart care, we have created designated Provider Care Teams.  These Care Teams include your primary Cardiologist (physician) and Advanced Practice Providers (APPs -  Physician Assistants and Nurse Practitioners) who all work together to provide you with the care you need, when you need it.  We recommend signing up for the patient portal called "MyChart".  Sign up information is provided on this After Visit Summary.  MyChart is used to connect with patients for Virtual Visits (Telemedicine).  Patients are able to view lab/test results, encounter notes, upcoming appointments, etc.  Non-urgent messages can be sent to your provider as well.   To learn more about what you can do with MyChart, go to [email or web address].    Your next appointment:   Follow up as needed   The format for your next appointment:   In Person  Provider:   Kate Sable, MD   Other Instructions      Signed, Kate Sable, MD  01/05/2020 10:28 AM    [place]

## 2020-01-05 NOTE — Patient Instructions (Signed)

## 2020-01-05 NOTE — Telephone Encounter (Signed)
Patient scheduled for component testing in [facility] with Webb Silversmith on 8/16 at 8:30am.

## 2020-01-12 NOTE — Telephone Encounter (Signed)
Hopefully that works with whoever the doctor is that day.  Salvatore Marvel, MD [facility] [place] [facility] [place]

## 2020-01-18 ENCOUNTER — Encounter: Payer: Self-pay | Admitting: Family Medicine

## 2020-01-18 ENCOUNTER — Ambulatory Visit (INDEPENDENT_AMBULATORY_CARE_PROVIDER_SITE_OTHER): Payer: 59 | Admitting: Family Medicine

## 2020-01-18 ENCOUNTER — Telehealth: Payer: Self-pay | Admitting: Family Medicine

## 2020-01-18 VITALS — BP 122/78 | HR 82 | Resp 12

## 2020-01-18 DIAGNOSIS — T50905A Adverse effect of unspecified drugs, medicaments and biological substances, initial encounter: Secondary | ICD-10-CM | POA: Diagnosis not present

## 2020-01-18 DIAGNOSIS — R42 Dizziness and giddiness: Secondary | ICD-10-CM | POA: Diagnosis not present

## 2020-01-18 DIAGNOSIS — T50995D Adverse effect of other drugs, medicaments and biological substances, subsequent encounter: Secondary | ICD-10-CM | POA: Diagnosis not present

## 2020-01-18 DIAGNOSIS — R220 Localized swelling, mass and lump, head: Secondary | ICD-10-CM

## 2020-01-18 DIAGNOSIS — Z87898 Personal history of other specified conditions: Secondary | ICD-10-CM | POA: Diagnosis not present

## 2020-01-18 DIAGNOSIS — T50B95D Adverse effect of other viral vaccines, subsequent encounter: Secondary | ICD-10-CM | POA: Diagnosis not present

## 2020-01-18 DIAGNOSIS — T50B95A Adverse effect of other viral vaccines, initial encounter: Secondary | ICD-10-CM | POA: Diagnosis not present

## 2020-01-18 DIAGNOSIS — R0602 Shortness of breath: Secondary | ICD-10-CM

## 2020-01-18 MED ORDER — ALBUTEROL SULFATE HFA 108 (90 BASE) MCG/ACT IN AERS: 2.0000 | INHALATION_SPRAY | RESPIRATORY_TRACT | 0 refills | Status: DC | PRN

## 2020-01-18 MED ORDER — PREDNISONE 10 MG PO TABS: ORAL_TABLET | ORAL | 0 refills | Status: DC

## 2020-01-18 NOTE — Patient Instructions (Addendum)
COVID vaccine component testing Kimberly Torres was not able to tolerate the COVID vaccine component testing challenge today at the office without adverse signs or symptoms of an allergic reaction. Testing was stopped and symptoms were treated. Based on your previous reaction after receiving the first dose of Pfizer and the results of today's testing, it is recommended that you do not receive a second dose of any COVID vaccine containing polyethylene glycol (PEG) or polysorbate Monitor for allergic symptoms such as rash, wheezing, diarrhea, swelling, and vomiting for the next 24 hours. If severe symptoms occur, give EpiPen 0.3 mg and call 911. For less severe symptoms treat with Benadryl 25-50 mg every 4 hours and call the clinic.  There is not currently a COVID vaccine available that does not contain polysorbate or polyethylene glycol. If a COVID vaccine becomes available that does not contain polysorbate or polyethylene glycol, and should you choose to receive a COVID vaccine that does not contain polyethyleneglycol or polysorbate in the future:  Have someone drive you to the administration site, get the vaccine at a site where they are prepared to treat an allergic reaction, and stay for the full 30 minutes after receiving the vaccine    Consider a medic alert bracelet listing triamcinolone  Medical exemption paperwork completed and given to patient  Call the clinic if this treatment plan is not working well for you  Follow up as needed

## 2020-01-18 NOTE — Progress Notes (Addendum)
[place] [address] Dept: [phone]    NEW PATIENT NOTE  Patient ID: Kimberly Torres, female    DOB: 12/06/1980  Age: 39 y.o. MRN: [id number] Date of Office Visit: 01/18/2020 Referring provider: Abner Greenspan, MD [address] [place],  [place] [address]    Chief Complaint: Allergy Testing  HPI Kimberly Torres is a 39 year old female who presents to the clinic for Covid vaccine component  testing.  She reports that on December 28 she received her first [place] Covid vaccine and within 20 minutes she began to experience shortness of breath, heart palpitations, sweating, and throat swelling.  She was evaluated in the emergency department where she received epinephrine, prednisone, and Benadryl.  She reports dizziness lasted for 4 weeks after she received the COVID vaccine.  She recently saw Dr. Orvil Feil, allergy specialist, who recommended that she avoid any additional Covid vaccines due to the severity of her initial reaction.  An [place] provider was informed and recommended Covid vaccine component testing. 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 She denies thyroid disease and diabetes.  At today's visit, she reports that she has tolerated the influenza vaccine yearly with no adverse reaction, she has tolerated MiraLAX previously with no adverse reaction, and she denies the use of dermal fillers.  She reports she is feeling well and has not taken any antihistamines for several months at this time.  Her current medications are listed in the chart.  Review of Systems  Constitutional: Negative.   HENT: Negative.   Eyes: Negative.   Respiratory: Negative.   Cardiovascular:       History of SVT on a beta blocker currently  Gastrointestinal: Negative.   Musculoskeletal: Negative.   Skin: Negative.   Neurological: Negative.    Endo/Heme/Allergies: Positive for environmental allergies.  Psychiatric/Behavioral: Negative.     Outpatient Encounter Medications as of 01/18/2020  Medication Sig  . albuterol (VENTOLIN HFA) 108 (90 Base) MCG/ACT inhaler Inhale 2 puffs into the lungs every 4 (four) hours as needed for wheezing or shortness of breath.  . Cholecalciferol (DIALYVITE VITAMIN D 5000 PO) Take by mouth daily.  . clonazePAM (KLONOPIN) 0.5 MG tablet Take 1-2 tablets by mouth daily as needed.  Marland Kitchen EPINEPHrine 0.3 mg/0.3 mL IJ SOAJ injection Inject 0.3 mLs (0.3 mg total) into the muscle as needed for anaphylaxis.  . fluticasone (FLONASE) 50 MCG/ACT nasal spray Place 2 sprays into both nostrils daily as needed for allergies.   . folic acid (FOLVITE) 1 MG tablet Take 1 mg by mouth daily.  . hydrOXYzine (ATARAX/VISTARIL) 50 MG tablet Take 1-2 tablets (50-100 mg total) by mouth at bedtime as needed.  . Melatonin 3 MG CAPS Take 2 capsules by mouth daily as needed.   . metoprolol tartrate (LOPRESSOR) 25 MG tablet Take 1 tablet (25 mg total) by mouth 2 (two) times daily as needed. As needed for elevated blood pressure, headache or high pulse rate  . Norethindrone-Ethinyl Estradiol-Fe Biphas (LO LOESTRIN FE) 1 MG-10 MCG / 10 MCG tablet Take 1 tablet by mouth daily.  . Omega-3 Fatty Acids (FISH OIL) 1000 MG CAPS Take 2 capsules by mouth daily.  . predniSONE (DELTASONE) 10 MG tablet Take 3 tablets today, then take 2 tablets in the morning and take 1 tablet on the third day  . Probiotic Product (CULTURELLE PROBIOTICS PO) Take 1 tablet  by mouth daily.  . sertraline (ZOLOFT) 50 MG tablet Take 100 mg by mouth daily.   Marland Kitchen tretinoin (RETIN-A) 0.025 % cream Apply topically 3 (three) times a week.    No facility-administered encounter medications on file as of 01/18/2020.     Drug Allergies: Allergies  Allergen Reactions  . Covid-19 Mrna Vacc (Moderna) Anaphylaxis    WAS GIVEN THE PFIZER NOT MODERNA  . Triamcinolone Acetonide  Anaphylaxis    Environmental History: Patient lives in a 41 year old home with no concern for water damage or mildew.  Flooring is good throughout.  Heating is electric and cooling is central.  Cats and dogs are located inside the home and cats are located inside the bedroom.  There is no concern for roaches in the home and the bed is at least 2 feet off the floor.  There are plastic or dust mite free covers in use on the bed and pillows.  There is no concern for exposure to tobacco smoke in the house or the car.  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  She does not have a job or hobby where she is exposed to fumes, chemicals, or dust.  She does not use tobacco or vape.  Family History: Kimberly Torres's family history includes ADD / ADHD in her mother and sister; Bipolar disorder in an other family member; Breast cancer in an other family member; Breast cancer (age of onset: 44) in her maternal grandmother; COPD in her maternal grandfather; Coronary artery disease in an other family member; Heart attack in an other family member; Hypertension in her mother..  Physical Exam: BP 122/78   Pulse 82   Resp 12   SpO2 100%    Physical Exam Vitals reviewed.  Constitutional:      Appearance: Normal appearance.  HENT:     Head: Normocephalic and atraumatic.     Right Ear: Tympanic membrane normal.     Left Ear: Tympanic membrane normal.     Nose:     Comments: Bilateral nares normal.  Pharynx slightly erythematous with left tonsil 2+ prior to initiating testing.  No change in pharynx or tonsils throughout the testing.  Ears normal.  Eyes normal. Eyes:     Conjunctiva/sclera: Conjunctivae normal.  Cardiovascular:     Rate and Rhythm: Normal rate and regular rhythm.     Heart sounds: Normal heart sounds. No murmur heard.   Pulmonary:     Effort: Pulmonary effort is normal.     Breath  sounds: Normal breath sounds.     Comments: Lungs clear to auscultation prior to initiating testing Musculoskeletal:     Cervical back: Normal range of motion and neck supple.  Neurological:     Mental Status: She is alert.    Diagnostics: FVC 3.18, FEV1 2.73.  Predicted FVC 4.21, predicted FEV1 3.42.  Spirometry indicates mild restriction.   Post Xopenex inhaler spirometry FVC 2.96, FEV1 2.57.  Spirometry indicates mild restriction with a decrease in FVC and FEV1.  Procedure note: Written consent obtained  Allergy testing: Covid vaccine component testing   Skin prick testing- Histamine (1.8 mg/mL) puncture:  +2  Control (negative - HSA) puncture:  Negative  Triamcinolone puncture:  Negative  Methylprednisolone puncture:  Negative  Miralax puncture (1:100 or 1.7 mg/mL):  Negative   Miralax puncture (1:10 or 17 mg/mL):  Negative- patient reports pruritus- no wheel or flare  Miralax  puncture (1:1 or 170 mg/mL):  Not administered    Intradermal testing- Control (negative - HSA) intradermal:  Negative  Triamcinolone (1:100):  4+  Methyprednisolone (1:100):  Negative  Triamcinolone (1:10):  Not administered  Methylprednisolone (1:10):  Not administered  Triamcinolone (1:1):  Not administered      Patient reported slight chest tightness and pruritus after administration of intradermal triamcinolone 1:100. Testing was stopped at that time and she was given Allegra 180 mg and Xopenex via inhaler. Physical exam revealed bilateral clear lung sounds and was absent of hives. Vital signs were stable. At that time, she began to experience worsening chest tightness and developed bilateral expiratory wheeze. She was given epinephrine 0.3 mg IM right thigh, Benadryl 50 mg and Xopenex via nebulizer. Physical exam including lung sounds and respiratory rate returned to normal range. Upon discharge the patient was asymptomatic, with stable vital signs, and walked out of the building unassisted with her  husband who was driving her home. She verbalized understanding, at that time, to call the clinic should she begin to experience any symptoms of anaphylaxis, which were reviewed at that time. She was prescribed a 3 day course of prednisone to diminish the chance of a bimodal reaction.  Assessment  Assessment and Plan: 1. Adverse effect of other viral vaccines, subsequent encounter   2. History of shortness of breath     Meds ordered this encounter  Medications  . albuterol (VENTOLIN HFA) 108 (90 Base) MCG/ACT inhaler    Sig: Inhale 2 puffs into the lungs every 4 (four) hours as needed for wheezing or shortness of breath.    Dispense:  18 g    Refill:  0  . predniSONE (DELTASONE) 10 MG tablet    Sig: Take 3 tablets today, then take 2 tablets in the morning and take 1 tablet on the third day    Dispense:  6 tablet    Refill:  0    Patient Instructions  COVID vaccine component testing Doreene Nest was not able to tolerate the COVID vaccine component testing challenge today at the office without adverse signs or symptoms of an allergic reaction. Testing was stopped and symptoms were treated. Based on your previous reaction after receiving the first dose of Pfizer and the results of today's testing, it is recommended that you do not receive a second dose of any COVID vaccine containing polyethylene glycol (PEG) or polysorbate Take prednisone as prescribed. Monitor for allergic symptoms such as rash, wheezing, diarrhea, swelling, and vomiting for the next 24 hours. If severe symptoms occur, give EpiPen 0.3 mg and call 911. For less severe symptoms treat with Benadryl 25-50 mg every 4 hours and call the clinic.  There is not currently a COVID vaccine available that does not contain polysorbate or polyethylene glycol. If a COVID vaccine becomes available that does not contain polysorbate or polyethylene glycol, and should you choose to receive a COVID vaccine that does not contain  polyethyleneglycol or polysorbate in the future:  Have someone drive you to the administration site, get the vaccine at a site where they are prepared to treat an allergic reaction, and stay for the full 30 minutes after receiving the vaccine    Consider a medic alert bracelet listing triamcinolone  Medical exemption paperwork completed and given to patient  Call the clinic if this treatment plan is not working well for you  Follow up as needed   Return if symptoms worsen or fail to improve.   Thank you  for the opportunity to care for this patient.  Please do not hesitate to contact me with questions.  Gareth Morgan, FNP [facility]  ________________________________________________  I have provided oversight concerning [facility]'s evaluation and treatment of this patient's health issues addressed during today's encounter. I had no prior knowledge of this patient or her history until I was contacted and involved regarding her evolving reaction.  I agree with [facility]'s assessment and therapeutic plan as outlined in the note.    Signed,   R Edgar Frisk, MD

## 2020-01-18 NOTE — Telephone Encounter (Signed)
Patient reports she is currently feeling well. She denies shortness of breath, cough or wheeze. She has picked up albuterol from the pharmacy and reports that she will have her husband pick up the prednisone taper. All questions have been addressed and answered. She has 2 sets of EpiPen's. She voices understanding to call the clinic or 911 with any symptoms. We will call to check on her tomorrow.

## 2020-01-18 NOTE — Progress Notes (Deleted)
[place] [address] Dept: [phone]  FOLLOW UP NOTE  Patient ID: Kimberly Torres, female    DOB: [birth year]  Age: 39 y.o. MRN: [id number] Date of Office Visit: 01/18/2020  Assessment  Chief Complaint: No chief complaint on file.  HPI Kimberly Torres is a 39 year old female who presents to the clinic for Covid component vaccine testing.  She reports that on December 28 she received her first [place] Covid vaccine within 20 minutes again to experience shortness of breath, heart palpitations, sweating, and throat swelling.  She was taken to the emergency department where she received IV, prednisone, and Benadryl.  She reports dizziness lasted for 4 weeks.  She saw Dr. Remus Blake, allergy specialist, who recommended that she avoid any additional Covid vaccines due to the severity of her initial reaction.  Her history includes SVT for which she takes Lopressor.  And seasonal allergies for which she occasionally takes Allegra and Flonase with relief of symptoms.  At today's visit, she reports that she has tolerated the influenza vaccine yearly with no adverse reaction, she has tolerated MiraLAX previously with no adverse reaction, and she denies the use of dermal fillers.  She reports she is feeling well and has not taken any antihistamines for several months at this time.  Her current medications are listed in the chart.  Drug Allergies:  Allergies  Allergen Reactions  . Covid-19 Mrna Vacc (Moderna) Anaphylaxis    WAS GIVEN THE PFIZER NOT MODERNA    Physical Exam: There were no vitals taken for this visit.   Physical Exam Vitals reviewed.  Constitutional:      Appearance: Normal appearance.  HENT:     Head: Normocephalic and atraumatic.     Right Ear: Tympanic membrane normal.     Left Ear: Tympanic membrane normal.     Nose:     Comments: Bilateral nares normal.  Pharynx slightly erythematous left tonsil edematous with no exudate noted.  Right  tonsil flat and unremarkable.  Ears normal.  Eyes normal. Eyes:     Conjunctiva/sclera: Conjunctivae normal.  Cardiovascular:     Rate and Rhythm: Normal rate and regular rhythm.     Heart sounds: Normal heart sounds. No murmur heard.   Pulmonary:     Effort: Pulmonary effort is normal.     Breath sounds: Normal breath sounds.     Comments: Lungs clear to auscultation Musculoskeletal:        General: Normal range of motion.     Cervical back: Normal range of motion and neck supple.  Skin:    General: Skin is warm and dry.  Neurological:     Mental Status: She is alert and oriented to person, place, and time.  Psychiatric:        Mood and Affect: Mood normal.        Behavior: Behavior normal.        Thought Content: Thought content normal.        Judgment: Judgment normal.     Diagnostics: FVC 3.18, FEV1 3.73.  Predicted FVC 4.21, predicted FEV1 3.42.  Spirometry indicates mild restriction.  Assessment and Plan: 1. Adverse effect of other viral vaccines, subsequent encounter   2. History of shortness of breath     No orders of the defined types were placed in this encounter.   There are no Patient Instructions on file for this visit.  No follow-ups on file.    Thank you for the opportunity to care for this  patient.  Please do not hesitate to contact me with questions.  Gareth Morgan, FNP [facility]

## 2020-01-19 NOTE — Addendum Note (Signed)
Addended by: Orpah Greek D on: 01/19/2020 02:28 PM   Modules accepted: Orders

## 2020-01-22 ENCOUNTER — Ambulatory Visit
Admission: RE | Admit: 2020-01-22 | Discharge: 2020-01-22 | Disposition: A | Discharge status: Home / Self Care | Payer: 59 | Source: Ambulatory Visit | Attending: Family Medicine | Admitting: Family Medicine

## 2020-01-22 ENCOUNTER — Other Ambulatory Visit: Payer: Self-pay

## 2020-01-22 ENCOUNTER — Other Ambulatory Visit: Payer: 59

## 2020-01-22 DIAGNOSIS — N6489 Other specified disorders of breast: Secondary | ICD-10-CM

## 2020-01-22 DIAGNOSIS — R928 Other abnormal and inconclusive findings on diagnostic imaging of breast: Secondary | ICD-10-CM | POA: Insufficient documentation

## 2020-01-28 NOTE — Addendum Note (Signed)
Addended by: Felipa Emory on: 01/28/2020 11:11 AM   Modules accepted: Orders

## 2020-02-24 DIAGNOSIS — Z20828 Contact with and (suspected) exposure to other viral communicable diseases: Secondary | ICD-10-CM | POA: Diagnosis not present

## 2020-03-30 ENCOUNTER — Other Ambulatory Visit: Payer: Self-pay | Admitting: Family Medicine

## 2020-04-13 DIAGNOSIS — Z03818 Encounter for observation for suspected exposure to other biological agents ruled out: Secondary | ICD-10-CM | POA: Diagnosis not present

## 2020-04-13 DIAGNOSIS — Z1152 Encounter for screening for COVID-19: Secondary | ICD-10-CM | POA: Diagnosis not present

## 2020-05-25 DIAGNOSIS — L821 Other seborrheic keratosis: Secondary | ICD-10-CM | POA: Diagnosis not present

## 2020-05-25 DIAGNOSIS — D2262 Melanocytic nevi of left upper limb, including shoulder: Secondary | ICD-10-CM | POA: Diagnosis not present

## 2020-05-25 DIAGNOSIS — D2261 Melanocytic nevi of right upper limb, including shoulder: Secondary | ICD-10-CM | POA: Diagnosis not present

## 2020-05-25 DIAGNOSIS — D225 Melanocytic nevi of trunk: Secondary | ICD-10-CM | POA: Diagnosis not present

## 2020-05-25 DIAGNOSIS — D2272 Melanocytic nevi of left lower limb, including hip: Secondary | ICD-10-CM | POA: Diagnosis not present

## 2020-05-25 DIAGNOSIS — D2271 Melanocytic nevi of right lower limb, including hip: Secondary | ICD-10-CM | POA: Diagnosis not present

## 2020-06-07 ENCOUNTER — Telehealth: Payer: Self-pay | Admitting: Allergy & Immunology

## 2020-06-07 ENCOUNTER — Other Ambulatory Visit: Payer: Self-pay | Admitting: Family Medicine

## 2020-06-07 DIAGNOSIS — N6489 Other specified disorders of breast: Secondary | ICD-10-CM

## 2020-06-07 NOTE — Telephone Encounter (Signed)
I ran into Kimberly Torres's husband and he was telling me about her reaction to the COVID-19 vaccine. Kimberly Torres is very motivated to get fully vaccinated. I reviewed her record. She had COVID component testing with one of our nurse practitioners in August. At that time, she reacted strongly to the triamcinolone intradermal, which is her surrogate for polysorbate. She even had anaphylaxis and was treated as such in the clinic. It was recommended at the time that she not get any Covid 19 vaccines.  However, her testing does not really reflect her clinical state. For instance, she gets the flu vaccine annually, which contains polysorbate. However, her testing would point to an allergy to the flu vaccine, since polysorbate is used in the flu vaccine as of preservative. Testing for polyethylene glycol, which is present in the mRNA vaccines, was negative. However, she clearly reacted to [employer] vaccine when she was given it in December 2020.  Since August, we have gotten access to the vaccine itself and can give it in our clinic in a monitored environment. In addition, more research has come out demonstrating that the vast majority of patients who experienced anaphylaxis to the vaccine can tolerate a second dose without any issues. Clearly there is more than just IgE sensitization going on.  Therefore, Kimberly Torres is very motivated to get the vaccine. We have the capacity in the [place] office from a renal perspective, so I would like her to get it there. She cannot do it on January 26 when we have our next Covid vaccine clinic in [place]. However, she can do it on February 23 when we have a Covid vaccine clinic there. We will add her at 8:30 AM. She will get skin tested to both the Pfizer and Moderna vaccines. We will then give her a graded dose of either the Mpderna or Pfizer vaccine. I would probably push for the Moderna vaccine since she had anaphylaxis to the Pfizer vaccine, but we can make that decision on that the day  that she comes in.   Patient in agreement with the plan.   Malachi Bonds, MD [facility]

## 2020-06-08 ENCOUNTER — Telehealth: Payer: Self-pay | Admitting: Family Medicine

## 2020-06-08 DIAGNOSIS — N6489 Other specified disorders of breast: Secondary | ICD-10-CM

## 2020-06-08 NOTE — Telephone Encounter (Signed)
I put orders in

## 2020-06-08 NOTE — Telephone Encounter (Signed)
Unable to schedule due to missing orders - please place order for Right [place]   Due to the patient having her Diagnostic Mammogram at [facility], they require two [place] orders, One for the affected breast and one for the other breast in case they want a comparison [place].  Please specify in the order the clock location of the breast issue.   Below are the correct orders for a Diagnostic MM with Bilateral US  [id number]   MM DIAG BREAST TOMO BILATERAL [id number]   US BREAST LTD UNI LEFT INC AXILLA [id number]   US BREAST LTD UNI RIGHT INC AXILLA

## 2020-06-20 ENCOUNTER — Other Ambulatory Visit: Payer: Self-pay | Admitting: Adult Health

## 2020-06-20 ENCOUNTER — Encounter: Payer: Self-pay | Admitting: Family Medicine

## 2020-06-20 DIAGNOSIS — U071 COVID-19: Secondary | ICD-10-CM

## 2020-06-20 NOTE — Progress Notes (Signed)
Referral placed for COVID treatment.

## 2020-06-21 ENCOUNTER — Telehealth (INDEPENDENT_AMBULATORY_CARE_PROVIDER_SITE_OTHER): Payer: 59 | Admitting: Family Medicine

## 2020-06-21 ENCOUNTER — Encounter: Payer: Self-pay | Admitting: Family

## 2020-06-21 ENCOUNTER — Telehealth: Payer: Self-pay | Admitting: Family Medicine

## 2020-06-21 ENCOUNTER — Telehealth: Payer: Self-pay | Admitting: Family

## 2020-06-21 ENCOUNTER — Other Ambulatory Visit: Payer: Self-pay | Admitting: Family

## 2020-06-21 ENCOUNTER — Encounter: Payer: Self-pay | Admitting: Family Medicine

## 2020-06-21 DIAGNOSIS — U071 COVID-19: Secondary | ICD-10-CM | POA: Diagnosis not present

## 2020-06-21 DIAGNOSIS — E669 Obesity, unspecified: Secondary | ICD-10-CM

## 2020-06-21 DIAGNOSIS — Z8616 Personal history of COVID-19: Secondary | ICD-10-CM | POA: Insufficient documentation

## 2020-06-21 MED ORDER — HYDROCOD POLST-CPM POLST ER 10-8 MG/5ML PO SUER
5.0000 mL | Freq: Two times a day (BID) | ORAL | 0 refills | Status: DC | PRN
Start: 2020-06-21 — End: 2020-06-21

## 2020-06-21 MED ORDER — ALBUTEROL SULFATE HFA 108 (90 BASE) MCG/ACT IN AERS: 2.0000 | INHALATION_SPRAY | RESPIRATORY_TRACT | 1 refills | Status: DC | PRN

## 2020-06-21 MED ORDER — PREDNISONE 10 MG PO TABS: ORAL_TABLET | ORAL | 0 refills | Status: DC

## 2020-06-21 NOTE — Telephone Encounter (Signed)
Spoke with [name].  Virtual appointment scheduled today with Dr. Glori Bickers at 4:00 pm.

## 2020-06-21 NOTE — Telephone Encounter (Signed)
This pt emailed me and she has symptomatic covid  Would you please call her and see if she wants to do a virtual ?  I could do 4 pm today   Thanks

## 2020-06-21 NOTE — Telephone Encounter (Signed)
Called to discuss with patient about COVID-19 symptoms and the use of one of the available treatments for those with mild to moderate Covid symptoms and at a high risk of hospitalization.  Pt appears to qualify for outpatient treatment due to co-morbid conditions and/or a member of an at-risk group in accordance with the FDA Emergency Use Authorization.    Symptom onset: 06/19/20  Symptoms include sore throat, body aches  Vaccinated: 1 dose  Booster? No  Immunocompromised? No  Qualifiers: BMI 34; unvaccinated  I spoke with Ms. Ponzo regarding the risks, benefits, and potential financial costs associated with treatment with Sotrovimab and she wishes to continue with treatment.    Hello [name],   We contacted you because you were recently diagnosed with COVID-19 and may benefit from a new treatment for mild to moderate disease. This treatment helps reduce the chance of being hospitalized. For some patients with medical conditions that may increase the chances of an infection, the treatment also decreases the risk for serious symptoms related to COVID-19.   The Food and Drug Administration (FDA) approved emergency use of a new drug to treat patients with mild to moderate symptoms who have risk factors that could cause severe symptoms related to COVID-19. This new treatment is a monoclonal antibody. It works by attaching like a magnet to the SARS-CoV2 virus (the virus that causes COVID-19) and stops it from infecting more cells in your body. It does not kill the virus, but it prevents it from spreading throughout your body with the hope that it will decrease your symptoms after it is administered.   This new drug is an intravenous (IV) infusion called Sotrovimab that is given over one 30-minute session in our [facility] outpatient infusion clinic. You will need to stay about 60 minutes after the infusion to ensure you are tolerating it well and to watch for any allergic reaction to  the medication. More information will be given to you at the time of your appointment.   Important information:  . The potential side effects: 2-4% of recipients experience nausea, vomiting, diarrhea, dizziness, headaches, itching, worsening fevers or chills for around 24 hours. . There have been no serious infusion-related reactions. . Of the more than 3,000 patients who received the infusion, only one had an allergic response that ended once the infusion was stopped. This is why we monitor all of our patients closely for 60 minutes after the infusion.  . The COVID-19 vaccine (including boosters) must be delayed at least 90 days after receiving this infusion.  . The medication itself is free, but your insurance will be charged an infusion fee. The amount you may owe later varies from insurance to insurance. If you do not have insurance, we can put you in touch with our billing department. Please contact your insurance agent to discuss prior to your appointment if you would like further details about billing specific to your policy. The CMS code is: M1  . If you have been tested outside of a [facility], you MUST bring a copy of your positive test with you the morning of your appointment. You may take a photo of this and upload to your MyChart portal,  have the testing facility fax the result to [phone] or email a copy to [email or web address].    You have been scheduled to receive the monoclonal antibody therapy at [facility]:  06/22/20 at 11:30am    The address for the infusion clinic site is:   --The  GPS address is [address], and the parking is located near the [employer] building where you will see a "COVID19 Infusion" feather banner marking the entrance to parking. (See photos below.)            --Enter into the 2nd entrance where the "wave, flag banner" is at the road. Turn into this 2nd entrance and immediately turn left or right to park in one of the  marked spaces.   --Please stay in your car and call the desk for assistance inside at [phone]. Let us know which space you are in.    --The average time in the department is roughly 90 minutes to two hours for monoclonal treatment. This includes preparation of the medication, IV start and the required 60-minute monitoring after the infusion.     Should you develop worsening shortness of breath, chest pain or severe breathing problems, please do not wait for this appointment and go to the emergency room for evaluation and treatment instead. You will undergo another oxygen screen before your infusion to ensure this is the best treatment option for you. There is a chance that the best decision may be to send you to the emergency room for evaluation at the time of your appointment.    The day of your visit, you should: Marland Kitchen Get plenty of rest the night before and drink plenty of water. . Eat a light meal/snack before coming and take your medications as prescribed.  . Wear warm, comfortable clothes with a shirt that can roll-up over the elbow (will need IV start).  . Wear a mask.  . Consider bringing an activity to help pass the time.   Terri Piedra, NP 06/21/2020 2:03 PM

## 2020-06-21 NOTE — Patient Instructions (Addendum)
Drink fluids and rest  Try tussionex with caution for cough (sedating)  Get your infusion tomorrow as planned  Continue tylenol/motrin and supplements  Albuterol inhaler as needed for wheezing and take prednisone as directed as well  If severe/struggling to breathe please go to the ER Otherwise keep [place] posted

## 2020-06-21 NOTE — Progress Notes (Signed)
Virtual Visit via Video Note  I connected with Kimberly Torres on 06/21/20 at  4:00 PM EST by a video enabled telemedicine application and verified that I am speaking with the correct person using two identifiers.  Location: Patient: home Provider: office   I discussed the limitations of evaluation and management by telemedicine and the availability of in person appointments. The patient expressed understanding and agreed to proceed.  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 Fatigue 10/15/2019  . Vitamin D deficiency 07/27/2019  . Breast asymmetry 07/27/2019  . Generalized anxiety disorder with panic attacks 07/13/2019  . Immunization reaction 06/11/2019  . Obesity (BMI 30-39.9) 05/12/2017  . 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 HNP (herniated nucleus pulposus), lumbar 09/28/2013  . 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 Cluster headache 02/15/2012  . Routine general medical examination at a health care facility  01/28/2012  . Sinus tachycardia 09/11/2010  . HEMANGIOMA, HEPATIC 03/14/2009  . Attention deficit hyperactivity disorder (ADHD) 06/25/2007  . SCOLIOSIS 06/25/2007   Past Medical History:  Diagnosis Date  . Acne    sees derm- on accutane  . Anxiety   . Chondromalacia of right knee    right  . Depression   . Dysrhythmia    hx SVT  . H/O seasonal allergies   . Headache(784.0)    cluster migraines  . Insomnia   . Sinus tachycardia    eval by Dr. Stanford Breed in 2007: monior revealed sinus tach. vs EAT; Echo 10/07: EF 70%  . Varicose veins    eval. by vascular surgeon and procedure scheduled to correct   Past Surgical History:  Procedure Laterality Date  . CESAREAN SECTION  2009  . DIAGNOSTIC LAPAROSCOPY  2010   scar tissue  . KNEE SURGERY  04/10/2010  . LUMBAR LAMINECTOMY/DECOMPRESSION MICRODISCECTOMY Right 09/28/2013   Procedure: LUMBAR LAMINECTOMY/DECOMPRESSION MICRODISCECTOMY 1 LEVEL,LUMBAR  THREE-FOUR RIGHT ;  Surgeon: Charlie Pitter, MD;  Location: [facility];  Service: Neurosurgery;  Laterality: Right;  right  . MYRINGOTOMY     Social History   Tobacco Use  . Smoking status: Never Smoker  . Smokeless tobacco: Never Used  Vaping Use  . Vaping Use: Never used  Substance Use Topics  . Alcohol use: Yes    Alcohol/week: 0.0 standard drinks    Comment: occasional to rare  .  Drug use: No   Family History  Problem Relation Age of Onset  . Hypertension Mother   . ADD / ADHD Mother   . Coronary artery disease Other        grandfather  . Heart attack Other        grandfather  . Breast cancer Other        grandmother  . Bipolar disorder Other        grandmother  . ADD / ADHD Sister   . COPD Maternal Grandfather   . Breast cancer Maternal Grandmother 70   Allergies  Allergen Reactions  . Covid-19 Mrna Vacc (Moderna) Anaphylaxis    WAS GIVEN THE PFIZER NOT MODERNA  . Triamcinolone Acetonide Anaphylaxis   Current Outpatient Medications on File Prior to Visit   Medication Sig Dispense Refill  . albuterol (VENTOLIN HFA) 108 (90 Base) MCG/ACT inhaler Inhale 2 puffs into the lungs every 4 (four) hours as needed for wheezing or shortness of breath. 18 g 0  . Cholecalciferol (DIALYVITE VITAMIN D 5000 PO) Take by mouth daily.    Marland Kitchen EPINEPHrine 0.3 mg/0.3 mL IJ SOAJ injection Inject 0.3 mLs (0.3 mg total) into the muscle as needed for anaphylaxis. 1 each 1  . fluticasone (FLONASE) 50 MCG/ACT nasal spray Place 2 sprays into both nostrils daily as needed for allergies.     . folic acid (FOLVITE) 1 MG tablet Take 1 mg by mouth daily.    . hydrOXYzine (ATARAX/VISTARIL) 50 MG tablet Take 1-2 tablets (50-100 mg total) by mouth at bedtime as needed. 180 tablet 1  . Melatonin 3 MG CAPS Take 2 capsules by mouth daily as needed.     . metoprolol tartrate (LOPRESSOR) 25 MG tablet Take 1 tablet (25 mg total) by mouth 2 (two) times daily as needed. As needed for elevated blood pressure, headache or high pulse rate 180 tablet 3  . Norethindrone-Ethinyl Estradiol-Fe Biphas (LO LOESTRIN FE) 1 MG-10 MCG / 10 MCG tablet Take 1 tablet by mouth daily.    . Omega-3 Fatty Acids (FISH OIL) 1000 MG CAPS Take 2 capsules by mouth daily.    . Probiotic Product (CULTURELLE PROBIOTICS PO) Take 1 tablet by mouth daily.    . sertraline (ZOLOFT) 100 MG tablet Take 1 tablet by mouth daily.    Marland Kitchen tretinoin (RETIN-A) 0.025 % cream Apply topically 3 (three) times a week.      No current facility-administered medications on file prior to visit.   Review of Systems  Constitutional: Positive for fever and malaise/fatigue. Negative for chills.  HENT: Positive for congestion and sore throat. Negative for ear pain and sinus pain.   Eyes: Negative for blurred vision, discharge and redness.  Respiratory: Positive for cough and wheezing. Negative for sputum production, shortness of breath and stridor.   Cardiovascular: Negative for chest pain, palpitations and leg swelling.  Gastrointestinal: Negative  for abdominal pain, diarrhea, nausea and vomiting.  Musculoskeletal: Positive for myalgias.  Skin: Negative for rash.  Neurological: Positive for headaches. Negative for dizziness.    Observations/Objective: Pt sounds fatigued but not distressed  Hoarse voice  Few dry coughs and sniffles  Nl cognition-good historian  Nl mood   Assessment and Plan: Problem List Items Addressed This Visit      Other   COVID-19    With respiratory symptoms  Pt is partially immunized and did reach out to get a myoclonal ab infusion since she works at [employer] will be tomorrow  Adv fluids/rest/symptomatic care  Sent in albuterol mdi and prednisone taper for wheezing  tussionex for cough (caution of sedation)  ER parameters discussed  Will update if worse/sob/ new symptoms also            Follow Up Instructions: Drink fluids and rest  Try tussionex with caution for cough (sedating)  Get your infusion tomorrow as planned  Continue tylenol/motrin and supplements  Albuterol inhaler as needed for wheezing and take prednisone as directed as well  If severe/struggling to breathe please go to the ER Otherwise keep [place] posted    I discussed the assessment and treatment plan with the patient. The patient was provided an opportunity to ask questions and all were answered. The patient agreed with the plan and demonstrated an understanding of the instructions.   The patient was advised to call back or seek an in-person evaluation if the symptoms worsen or if the condition fails to improve as anticipated.  I provided 18 minutes of non-face-to-face time during this encounter.   Loura Pardon, MD

## 2020-06-21 NOTE — Assessment & Plan Note (Signed)
With respiratory symptoms  Pt is partially immunized and did reach out to get a myoclonal ab infusion since she works at [employer] will be tomorrow  Adv fluids/rest/symptomatic care Sent in albuterol mdi and prednisone taper for wheezing  tussionex for cough (caution of sedation)  ER parameters discussed  Will update if worse/sob/ new symptoms also

## 2020-06-21 NOTE — Progress Notes (Signed)
I connected by phone with Kimberly Torres on 06/21/2020 at 2:03 PM to discuss the potential use of a new treatment for mild to moderate COVID-19 viral infection in non-hospitalized patients.  This patient is a 40 y.o. female that meets the FDA criteria for Emergency Use Authorization of COVID monoclonal antibody sotrovimab.  Has a (+) direct SARS-CoV-2 viral test result  Has mild or moderate COVID-19   Is NOT hospitalized due to COVID-19  Is within 10 days of symptom onset  Has at least one of the high risk factor(s) for progression to severe COVID-19 and/or hospitalization as defined in EUA.  Specific high risk criteria : BMI > 25   I have spoken and communicated the following to the patient or parent/caregiver regarding COVID monoclonal antibody treatment:  1. FDA has authorized the emergency use for the treatment of mild to moderate COVID-19 in adults and pediatric patients with positive results of direct SARS-CoV-2 viral testing who are 63 years of age and older weighing at least 40 kg, and who are at high risk for progressing to severe COVID-19 and/or hospitalization.  2. The significant known and potential risks and benefits of COVID monoclonal antibody, and the extent to which such potential risks and benefits are unknown.  3. Information on available alternative treatments and the risks and benefits of those alternatives, including clinical trials.  4. Patients treated with COVID monoclonal antibody should continue to self-isolate and use infection control measures (e.g., wear mask, isolate, social distance, avoid sharing personal items, clean and disinfect "high touch" surfaces, and frequent handwashing) according to CDC guidelines.   5. The patient or parent/caregiver has the option to accept or refuse COVID monoclonal antibody treatment.  After reviewing this information with the patient, the patient has agreed to receive one of the available covid 19 monoclonal  antibodies and will be provided an appropriate fact sheet prior to infusion.   Mauricio Po, FNP 06/21/2020 2:03 PM

## 2020-06-22 ENCOUNTER — Ambulatory Visit (HOSPITAL_COMMUNITY)
Admission: RE | Admit: 2020-06-22 | Discharge: 2020-06-22 | Disposition: A | Discharge status: Home / Self Care | Payer: 59 | Source: Ambulatory Visit | Attending: Pulmonary Disease | Admitting: Pulmonary Disease

## 2020-06-22 DIAGNOSIS — Z6830 Body mass index (BMI) 30.0-30.9, adult: Secondary | ICD-10-CM | POA: Diagnosis not present

## 2020-06-22 DIAGNOSIS — U071 COVID-19: Secondary | ICD-10-CM

## 2020-06-22 DIAGNOSIS — E669 Obesity, unspecified: Secondary | ICD-10-CM | POA: Diagnosis not present

## 2020-06-22 MED ORDER — SOTROVIMAB 500 MG/8ML IV SOLN
500.0000 mg | Freq: Once | INTRAVENOUS | Status: AC
  Administered 2020-06-22: 500 mg via INTRAVENOUS

## 2020-06-22 MED ORDER — EPINEPHRINE 0.3 MG/0.3ML IJ SOAJ: 0.3000 mg | Freq: Once | INTRAMUSCULAR | Status: DC | PRN

## 2020-06-22 MED ORDER — METHYLPREDNISOLONE SODIUM SUCC 125 MG IJ SOLR: 125.0000 mg | Freq: Once | INTRAMUSCULAR | Status: DC | PRN

## 2020-06-22 MED ORDER — FAMOTIDINE IN NACL 20-0.9 MG/50ML-% IV SOLN: 20.0000 mg | Freq: Once | INTRAVENOUS | Status: DC | PRN

## 2020-06-22 MED ORDER — SODIUM CHLORIDE 0.9 % IV SOLN: INTRAVENOUS | Status: DC | PRN

## 2020-06-22 MED ORDER — DIPHENHYDRAMINE HCL 50 MG/ML IJ SOLN: 50.0000 mg | Freq: Once | INTRAMUSCULAR | Status: DC | PRN

## 2020-06-22 MED ORDER — ALBUTEROL SULFATE HFA 108 (90 BASE) MCG/ACT IN AERS: 2.0000 | INHALATION_SPRAY | Freq: Once | RESPIRATORY_TRACT | Status: DC | PRN

## 2020-06-22 NOTE — Progress Notes (Signed)
Patient reviewed Fact Sheet for Patients, Parents, and Caregivers for Emergency Use Authorization (EUA) of sotrovimab for the Treatment of Coronavirus. Patient also reviewed and is agreeable to the estimated cost of treatment. Patient is agreeable to proceed.   

## 2020-06-22 NOTE — Discharge Instructions (Signed)

## 2020-06-22 NOTE — Progress Notes (Signed)
Diagnosis: COVID-19  Physician: Dr. Patrick Wright  Procedure: Covid Infusion Clinic Med: Sotrovimab infusion - Provided patient with sotrovimab fact sheet for patients, parents, and caregivers prior to infusion.   Complications: No immediate complications noted  Discharge: Discharged home    

## 2020-07-15 ENCOUNTER — Ambulatory Visit
Admission: RE | Admit: 2020-07-15 | Discharge: 2020-07-15 | Disposition: A | Discharge status: Home / Self Care | Payer: 59 | Source: Ambulatory Visit | Attending: Family Medicine | Admitting: Family Medicine

## 2020-07-15 ENCOUNTER — Other Ambulatory Visit: Payer: Self-pay

## 2020-07-15 DIAGNOSIS — N6489 Other specified disorders of breast: Secondary | ICD-10-CM | POA: Diagnosis not present

## 2020-07-15 DIAGNOSIS — R922 Inconclusive mammogram: Secondary | ICD-10-CM | POA: Diagnosis not present

## 2020-07-27 ENCOUNTER — Encounter: Payer: 59 | Admitting: Allergy & Immunology

## 2020-10-04 ENCOUNTER — Encounter: Payer: 59 | Admitting: Allergy & Immunology

## 2020-10-20 DIAGNOSIS — Z20828 Contact with and (suspected) exposure to other viral communicable diseases: Secondary | ICD-10-CM | POA: Diagnosis not present

## 2020-10-26 ENCOUNTER — Other Ambulatory Visit: Payer: Self-pay

## 2020-10-26 ENCOUNTER — Ambulatory Visit (INDEPENDENT_AMBULATORY_CARE_PROVIDER_SITE_OTHER): Payer: 59 | Admitting: Dermatology

## 2020-10-26 DIAGNOSIS — L988 Other specified disorders of the skin and subcutaneous tissue: Secondary | ICD-10-CM

## 2020-10-26 NOTE — Progress Notes (Signed)
   New Patient Visit  Subjective  Kimberly Torres is a 40 y.o. female who presents for the following: Facial Elastosis (Pt here for Botox ).  The following portions of the chart were reviewed this encounter and updated as appropriate:   Tobacco  Allergies  Meds  Problems  Med Hx  Surg Hx  Fam Hx     Review of Systems:  No other skin or systemic complaints except as noted in HPI or Assessment and Plan.  Objective  Well appearing patient in no apparent distress; mood and affect are within normal limits.  A focused examination was performed including face. Relevant physical exam findings are noted in the Assessment and Plan.  Objective  face: Rhytides and volume loss.   Images               Assessment & Plan  Elastosis of skin face  Intralesional injection - face Location: See attached image  Informed consent: Discussed risks (infection, pain, bleeding, bruising, swelling, allergic reaction, paralysis of nearby muscles, eyelid droop, double vision, neck weakness, difficulty breathing, headache, undesirable cosmetic result, and need for additional treatment) and benefits of the procedure, as well as the alternatives.  Informed consent was obtained.  Preparation: The area was cleansed with alcohol.  Procedure Details:  Botox was injected into the dermis with a 30-gauge needle. Pressure applied to any bleeding. Ice packs offered for swelling.  Lot Number:  [id number] Expiration:  10/2022  Total Units Injected:  25 units   Plan: Patient was instructed to remain upright for 4 hours. Patient was instructed to avoid massaging the face and avoid vigorous exercise for the rest of the day. Tylenol may be used for headache.  Allow 2 weeks before returning to clinic for additional dosing as needed. Patient will call for any problems.   Return in about 4 weeks (around 11/23/2020) for Botox .  IMarye Round, CMA, am acting as scribe for Sarina Ser, MD  .  Documentation: I have reviewed the above documentation for accuracy and completeness, and I agree with the above.  Sarina Ser, MD

## 2020-10-26 NOTE — Patient Instructions (Addendum)
  If you have any questions or concerns for your doctor, please call our main line at [phone] and press option 4 to reach your doctor's medical assistant. If no one answers, please leave a voicemail as directed and we will return your call as soon as possible. Messages left after 4 pm will be answered the following business day.   You may also send [place] a message via [place]. We typically respond to MyChart messages within 1-2 business days.  For prescription refills, please ask your pharmacy to contact our office. Our fax number is [phone].  If you have an urgent issue when the clinic is closed that cannot wait until the next business day, you can page your doctor at the number below.    Please note that while we do our best to be available for urgent issues outside of office hours, we are not available 24/7.   If you have an urgent issue and are unable to reach [place], you may choose to seek medical care at your doctor's office, retail clinic, urgent care center, or emergency room.  If you have a medical emergency, please immediately call 911 or go to the emergency department.  Pager Numbers  - Dr. Nehemiah Massed: [phone]  - Dr. Laurence Ferrari: [phone]  - Dr. Nicole Kindred: [phone]  In the event of inclement weather, please call our main line at [phone] for an update on the status of any delays or closures.  Dermatology Medication Tips: Please keep the boxes that topical medications come in in order to help keep track of the instructions about where and how to use these. Pharmacies typically print the medication instructions only on the boxes and not directly on the medication tubes.   If your medication is too expensive, please contact our office at [phone] option 4 or send [place] a message through [place].   We are unable to tell what your co-pay for medications will be in advance as this is different depending on your insurance coverage. However, we may be able to find a  substitute medication at lower cost or fill out paperwork to get insurance to cover a needed medication.   If a prior authorization is required to get your medication covered by your insurance company, please allow [place] 1-2 business days to complete this process.  Drug prices often vary depending on where the prescription is filled and some pharmacies may offer cheaper prices.  The website www.goodrx.com contains coupons for medications through different pharmacies. The prices here do not account for what the cost may be with help from insurance (it may be cheaper with your insurance), but the website can give you the price if you did not use any insurance.  - You can print the associated coupon and take it with your prescription to the pharmacy.  - You may also stop by our office during regular business hours and pick up a GoodRx coupon card.  - If you need your prescription sent electronically to a different pharmacy, notify our office through [facility] or by phone at [phone] option 4.   Marland Kitchense

## 2020-10-31 ENCOUNTER — Encounter: Payer: Self-pay | Admitting: Dermatology

## 2020-11-02 ENCOUNTER — Telehealth: Payer: Self-pay | Admitting: Family Medicine

## 2020-11-02 DIAGNOSIS — Z0000 Encounter for general adult medical examination without abnormal findings: Secondary | ICD-10-CM

## 2020-11-02 DIAGNOSIS — E559 Vitamin D deficiency, unspecified: Secondary | ICD-10-CM

## 2020-11-02 NOTE — Telephone Encounter (Signed)
-----   Message from Cloyd Stagers, RT sent at 10/17/2020  2:15 PM EDT ----- Regarding: Lab Orders for Thursday 6.2.2022 Please place lab orders for Thursday 6.2.2022, office visit for physical on Tuesday 6.7.2022 Thank you, Dyke Maes RT(R)

## 2020-11-03 ENCOUNTER — Other Ambulatory Visit (INDEPENDENT_AMBULATORY_CARE_PROVIDER_SITE_OTHER): Payer: 59

## 2020-11-03 ENCOUNTER — Other Ambulatory Visit: Payer: Self-pay

## 2020-11-03 DIAGNOSIS — E559 Vitamin D deficiency, unspecified: Secondary | ICD-10-CM | POA: Diagnosis not present

## 2020-11-03 DIAGNOSIS — Z0000 Encounter for general adult medical examination without abnormal findings: Secondary | ICD-10-CM | POA: Diagnosis not present

## 2020-11-03 LAB — CBC WITH DIFFERENTIAL/PLATELET
Basophils Absolute: 0 10*3/uL (ref 0.0–0.1)
Basophils Relative: 0.8 % (ref 0.0–3.0)
Eosinophils Absolute: 0.1 10*3/uL (ref 0.0–0.7)
Eosinophils Relative: 1.9 % (ref 0.0–5.0)
HCT: 38.5 % (ref 36.0–46.0)
Hemoglobin: 13 g/dL (ref 12.0–15.0)
Lymphocytes Relative: 36.7 % (ref 12.0–46.0)
Lymphs Abs: 2.2 10*3/uL (ref 0.7–4.0)
MCHC: 33.7 g/dL (ref 30.0–36.0)
MCV: 88.3 fl (ref 78.0–100.0)
Monocytes Absolute: 0.5 10*3/uL (ref 0.1–1.0)
Monocytes Relative: 7.7 % (ref 3.0–12.0)
Neutro Abs: 3.1 10*3/uL (ref 1.4–7.7)
Neutrophils Relative %: 52.9 % (ref 43.0–77.0)
Platelets: 277 10*3/uL (ref 150.0–400.0)
RBC: 4.36 Mil/uL (ref 3.87–5.11)
RDW: 12.9 % (ref 11.5–15.5)
WBC: 5.9 10*3/uL (ref 4.0–10.5)

## 2020-11-03 LAB — VITAMIN D 25 HYDROXY (VIT D DEFICIENCY, FRACTURES): VITD: 47.89 ng/mL (ref 30.00–100.00)

## 2020-11-03 LAB — TSH: TSH: 3.7 u[IU]/mL (ref 0.35–4.50)

## 2020-11-04 ENCOUNTER — Other Ambulatory Visit: Payer: Self-pay

## 2020-11-04 LAB — COMPREHENSIVE METABOLIC PANEL
ALT: 16 U/L (ref 0–35)
AST: 15 U/L (ref 0–37)
Albumin: 4 g/dL (ref 3.5–5.2)
Alkaline Phosphatase: 49 U/L (ref 39–117)
BUN: 16 mg/dL (ref 6–23)
CO2: 23 mEq/L (ref 19–32)
Calcium: 9.1 mg/dL (ref 8.4–10.5)
Chloride: 107 mEq/L (ref 96–112)
Creatinine, Ser: 0.9 mg/dL (ref 0.40–1.20)
GFR: 79.39 mL/min (ref >60.00)
Glucose, Bld: 99 mg/dL (ref 70–99)
Potassium: 4.3 mEq/L (ref 3.5–5.1)
Sodium: 142 mEq/L (ref 135–145)
Total Bilirubin: 0.4 mg/dL (ref 0.2–1.2)
Total Protein: 6.7 g/dL (ref 6.0–8.3)

## 2020-11-04 LAB — LIPID PANEL
Cholesterol: 139 mg/dL (ref 0–200)
HDL: 45.3 mg/dL (ref >39.00)
LDL Cholesterol: 71 mg/dL (ref 0–99)
NonHDL: 93.48
Total CHOL/HDL Ratio: 3
Triglycerides: 112 mg/dL (ref 0.0–149.0)
VLDL: 22.4 mg/dL (ref 0.0–40.0)

## 2020-11-08 ENCOUNTER — Ambulatory Visit (INDEPENDENT_AMBULATORY_CARE_PROVIDER_SITE_OTHER): Payer: 59 | Admitting: Family Medicine

## 2020-11-08 ENCOUNTER — Other Ambulatory Visit: Payer: Self-pay

## 2020-11-08 ENCOUNTER — Encounter: Payer: Self-pay | Admitting: Family Medicine

## 2020-11-08 VITALS — BP 104/62 | HR 83 | Temp 97.0°F | Ht 67.0 in | Wt 224.2 lb

## 2020-11-08 DIAGNOSIS — E669 Obesity, unspecified: Secondary | ICD-10-CM | POA: Diagnosis not present

## 2020-11-08 DIAGNOSIS — Z0000 Encounter for general adult medical examination without abnormal findings: Secondary | ICD-10-CM

## 2020-11-08 DIAGNOSIS — F411 Generalized anxiety disorder: Secondary | ICD-10-CM

## 2020-11-08 DIAGNOSIS — R000 Tachycardia, unspecified: Secondary | ICD-10-CM | POA: Diagnosis not present

## 2020-11-08 DIAGNOSIS — F410 Panic disorder [episodic paroxysmal anxiety] without agoraphobia: Secondary | ICD-10-CM

## 2020-11-08 DIAGNOSIS — E559 Vitamin D deficiency, unspecified: Secondary | ICD-10-CM

## 2020-11-08 NOTE — Assessment & Plan Note (Addendum)
Discussed how this problem influences overall health and the risks it imposes  Reviewed plan for weight loss with lower calorie diet (via better food choices and also portion control or program like weight watchers) and exercise building up to or more than 30 minutes 5 days per week including some aerobic activity   Pt is eating cleaner now and feels better, down 10 lb (counting macros)  Disc options for exercise and plans to start

## 2020-11-08 NOTE — Assessment & Plan Note (Signed)
Doing better under care of psychiatry Dr Lajoyce Corners Plans to continue zoloft 100 mg daily  Enc self care Reviewed stressors/ coping techniques/symptoms/ support sources

## 2020-11-08 NOTE — Patient Instructions (Signed)
Continue same dose of vitamin D  Work on planning an exercise schedule  Use sun protection   Keep eating well

## 2020-11-08 NOTE — Progress Notes (Signed)
Subjective:    Patient ID: Kimberly Torres, female    DOB: 11-18-1980, 40 y.o.   MRN: [id number]  This visit occurred during the SARS-CoV-2 public health emergency.  Safety protocols were in place, including screening questions prior to the visit, additional usage of staff PPE, and extensive cleaning of exam room while observing appropriate contact time as indicated for disinfecting solutions.    HPI Here for health maintenance exam and to review chronic medical problems    Wt Readings from Last 3 Encounters:  11/08/20 224 lb 3 oz (101.7 kg)  01/05/20 228 lb 4 oz (103.5 kg)  11/30/19 225 lb (102.1 kg)   35.11 kg/m  Watching diet and has lost 10 lb  Feels better doing that and more energy [employer]  Exercise - needs to work on more (walks the neighborhood with dogs)- a fast walk  Has an elliptical at home     Doing well  Taking some time off this summer  Working a lot    Tdap 3/16 Flu shot 9/21   She had covid 19 in January -recovered  Had one covid vaccine and was allergic to never got another one   Pap 1/19 -per pt gyn , planning appt for pap soon  Takes loestrin fe 1-10  Mammogram 2/22- up to date  Self breast exam - no lumps   Vit D def- D level is 47.8 Takes 5000 iu daily  Will continue this   BP Readings from Last 3 Encounters:  11/08/20 104/62  06/22/20 102/68  06/21/20 116/75   Pulse Readings from Last 3 Encounters:  11/08/20 83  06/22/20 67  06/21/20 84   Tachycardia is controlled with metoprolol 25 mg bid (prn)  Thinks zoloft helps some/some anxiety component   Mood/ history of anxiety  Taking zoloft under care of psychiatry Dr Lajoyce Corners Doing well !    Cholesterol Lab Results  Component Value Date   CHOL 139 11/03/2020   CHOL 153 07/22/2019   CHOL 143 07/18/2018   Lab Results  Component Value Date   HDL 45.30 11/03/2020   HDL 50 07/22/2019   HDL 54.80 07/18/2018   Lab Results  Component Value Date   LDLCALC 71  11/03/2020   LDLCALC 81 07/22/2019   LDLCALC 72 07/18/2018   Lab Results  Component Value Date   TRIG 112.0 11/03/2020   TRIG 121 07/22/2019   TRIG 83.0 07/18/2018   Lab Results  Component Value Date   CHOLHDL 3 11/03/2020   CHOLHDL 3.1 07/22/2019   CHOLHDL 3 07/18/2018   No results found for: LDLDIRECT  Other labs Results for orders placed or performed in visit on 11/03/20  VITAMIN D 25 Hydroxy (Vit-D Deficiency, Fractures)  Result Value Ref Range   VITD 47.89 30.00 - 100.00 ng/mL  TSH  Result Value Ref Range   TSH 3.70 0.35 - 4.50 uIU/mL  Lipid panel  Result Value Ref Range   Cholesterol 139 0 - 200 mg/dL   Triglycerides 112.0 0.0 - 149.0 mg/dL   HDL 45.30 >39.00 mg/dL   VLDL 22.4 0.0 - 40.0 mg/dL   LDL Cholesterol 71 0 - 99 mg/dL   Total CHOL/HDL Ratio 3    NonHDL 93.48   Comprehensive metabolic panel  Result Value Ref Range   Sodium 142 135 - 145 mEq/L   Potassium 4.3 3.5 - 5.1 mEq/L   Chloride 107 96 - 112 mEq/L   CO2 23 19 - 32 mEq/L   Glucose, Bld 99  70 - 99 mg/dL   BUN 16 6 - 23 mg/dL   Creatinine, Ser 0.90 0.40 - 1.20 mg/dL   Total Bilirubin 0.4 0.2 - 1.2 mg/dL   Alkaline Phosphatase 49 39 - 117 U/L   AST 15 0 - 37 U/L   ALT 16 0 - 35 U/L   Total Protein 6.7 6.0 - 8.3 g/dL   Albumin 4.0 3.5 - 5.2 g/dL   GFR 79.39 >60.00 mL/min   Calcium 9.1 8.4 - 10.5 mg/dL  CBC with Differential/Platelet  Result Value Ref Range   WBC 5.9 4.0 - 10.5 K/uL   RBC 4.36 3.87 - 5.11 Mil/uL   Hemoglobin 13.0 12.0 - 15.0 g/dL   HCT 38.5 36.0 - 46.0 %   MCV 88.3 78.0 - 100.0 fl   MCHC 33.7 30.0 - 36.0 g/dL   RDW 12.9 11.5 - 15.5 %   Platelets 277.0 150.0 - 400.0 K/uL   Neutrophils Relative % 52.9 43.0 - 77.0 %   Lymphocytes Relative 36.7 12.0 - 46.0 %   Monocytes Relative 7.7 3.0 - 12.0 %   Eosinophils Relative 1.9 0.0 - 5.0 %   Basophils Relative 0.8 0.0 - 3.0 %   Neutro Abs 3.1 1.4 - 7.7 K/uL   Lymphs Abs 2.2 0.7 - 4.0 K/uL   Monocytes Absolute 0.5 0.1 - 1.0 K/uL    Eosinophils Absolute 0.1 0.0 - 0.7 K/uL   Basophils Absolute 0.0 0.0 - 0.1 K/uL    Patient Active Problem List   Diagnosis Date Noted  . History of COVID-19 06/21/2020  . Fatigue 10/15/2019  . Vitamin D deficiency 07/27/2019  . Breast asymmetry 07/27/2019  . Generalized anxiety disorder with panic attacks 07/13/2019  . Immunization reaction 06/11/2019  . Obesity (BMI 30-39.9) 05/12/2017  . 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 HNP (herniated nucleus pulposus), lumbar 09/28/2013  . 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 Cluster headache 02/15/2012  . Routine general medical examination at a health care facility 01/28/2012  . Sinus tachycardia 09/11/2010  . HEMANGIOMA, HEPATIC 03/14/2009  . Attention deficit hyperactivity disorder (ADHD) 06/25/2007  . SCOLIOSIS 06/25/2007   Past Medical History:  Diagnosis Date  . Acne    sees derm- on accutane  . Anxiety   . Chondromalacia of right knee    right  . Depression   . Dysplastic nevus 05/16/2009   R deltoid - moderate  . Dysplastic nevus 09/06/2009   R med knee - moderate  . Dysrhythmia    hx SVT  . H/O seasonal allergies   . Headache(784.0)    cluster migraines  . Insomnia   . Sinus tachycardia    eval by Dr. Stanford Breed in 2007: monior revealed sinus tach. vs EAT; Echo 10/07: EF 70%  . Varicose veins    eval. by vascular surgeon and procedure scheduled to correct   Past Surgical History:  Procedure Laterality Date  . CESAREAN SECTION  2009  . DIAGNOSTIC LAPAROSCOPY  2010   scar tissue  . KNEE SURGERY  04/10/2010  . LUMBAR LAMINECTOMY/DECOMPRESSION MICRODISCECTOMY Right 09/28/2013   Procedure: LUMBAR LAMINECTOMY/DECOMPRESSION MICRODISCECTOMY 1 LEVEL,LUMBAR  THREE-FOUR RIGHT ;  Surgeon: Charlie Pitter, MD;  Location: [facility];  Service: Neurosurgery;  Laterality: Right;  right  . MYRINGOTOMY     Social History   Tobacco Use  . Smoking status: Never Smoker  . Smokeless tobacco: Never Used   Vaping Use  . Vaping Use: Never used  Substance Use Topics  . Alcohol  use: Yes    Alcohol/week: 0.0 standard drinks    Comment: occasional to rare  . Drug use: No   Family History  Problem Relation Age of Onset  . Hypertension Mother   . ADD / ADHD Mother   . Coronary artery disease Other        grandfather  . Heart attack Other        grandfather  . Breast cancer Other        grandmother  . Bipolar disorder Other        grandmother  . ADD / ADHD Sister   . COPD Maternal Grandfather   . Breast cancer Maternal Grandmother 70   Allergies  Allergen Reactions  . Covid-19 Mrna Vacc (Moderna) Anaphylaxis    WAS GIVEN THE PFIZER NOT MODERNA  . Triamcinolone Acetonide Anaphylaxis   Current Outpatient Medications on File Prior to Visit  Medication Sig Dispense Refill  . Cholecalciferol (DIALYVITE VITAMIN D 5000 PO) Take by mouth daily.    Marland Kitchen EPINEPHrine 0.3 mg/0.3 mL IJ SOAJ injection Inject 0.3 mLs (0.3 mg total) into the muscle as needed for anaphylaxis. 1 each 1  . fluticasone (FLONASE) 50 MCG/ACT nasal spray Place 2 sprays into both nostrils daily as needed for allergies.     . folic acid (FOLVITE) 1 MG tablet Take 1 mg by mouth daily.    . hydrOXYzine (ATARAX/VISTARIL) 50 MG tablet TAKE 1 TO 2 TABLETS BY MOUTH NIGHTLY FOR ANXIETY AND INSOMNIA 180 tablet 1  . melatonin 5 MG TABS Take 5 mg by mouth at bedtime as needed.    . metoprolol tartrate (LOPRESSOR) 25 MG tablet TAKE 1 TABLET BY MOUTH 2 TIMES DAILY AS NEEDED FOR ELEVATED BLOOD PRESSURE, HEADACHE OR HIGH PULSE RATE 180 tablet 3  . Norethindrone-Ethinyl Estradiol-Fe Biphas (LO LOESTRIN FE) 1 MG-10 MCG / 10 MCG tablet TAKE 1 TABLET BY MOUTH DAILY. TAKE CONTINUOUS ACTIVE TABLETS FOR 3 PACKS THEN PLACEBO TABLETS 112 tablet 4  . Omega-3 Fatty Acids (FISH OIL) 1000 MG CAPS Take 2 capsules by mouth daily.    . Probiotic Product (CULTURELLE PROBIOTICS PO) Take 1 tablet by mouth daily.    . sertraline (ZOLOFT) 100 MG tablet TAKE 1  TABLET BY MOUTH ONCE DAILY WITH BREAKFAST 90 tablet 1  . tretinoin (RETIN-A) 0.025 % cream Apply topically 3 (three) times a week.      No current facility-administered medications on file prior to visit.    Review of Systems  Constitutional: Negative for activity change, appetite change, fatigue, fever and unexpected weight change.  HENT: Negative for congestion, ear pain, rhinorrhea, sinus pressure and sore throat.   Eyes: Negative for pain, redness and visual disturbance.  Respiratory: Negative for cough, shortness of breath and wheezing.   Cardiovascular: Negative for chest pain and palpitations.  Gastrointestinal: Negative for abdominal pain, blood in stool, constipation and diarrhea.  Endocrine: Negative for polydipsia and polyuria.  Genitourinary: Negative for dysuria, frequency and urgency.  Musculoskeletal: Negative for arthralgias, back pain and myalgias.  Skin: Negative for pallor and rash.  Allergic/Immunologic: Negative for environmental allergies.  Neurological: Negative for dizziness, syncope and headaches.  Hematological: Negative for adenopathy. Does not bruise/bleed easily.  Psychiatric/Behavioral: Negative for decreased concentration and dysphoric mood. The patient is not nervous/anxious.        Objective:   Physical Exam Constitutional:      General: She is not in acute distress.    Appearance: Normal appearance. She is well-developed. She is obese. She is  not ill-appearing or diaphoretic.  HENT:     Head: Normocephalic and atraumatic.     Right Ear: Tympanic membrane, ear canal and external ear normal.     Left Ear: Tympanic membrane, ear canal and external ear normal.     Nose: Nose normal. No congestion.     Mouth/Throat:     Mouth: Mucous membranes are moist.     Pharynx: Oropharynx is clear. No posterior oropharyngeal erythema.  Eyes:     General: No scleral icterus.    Extraocular Movements: Extraocular movements intact.     Conjunctiva/sclera:  Conjunctivae normal.     Pupils: Pupils are equal, round, and reactive to light.  Neck:     Thyroid: No thyromegaly.     Vascular: No carotid bruit or JVD.  Cardiovascular:     Rate and Rhythm: Normal rate and regular rhythm.     Pulses: Normal pulses.     Heart sounds: Normal heart sounds. No gallop.   Pulmonary:     Effort: Pulmonary effort is normal. No respiratory distress.     Breath sounds: Normal breath sounds. No wheezing.     Comments: Good air exch Chest:     Chest wall: No tenderness.  Abdominal:     General: Bowel sounds are normal. There is no distension or abdominal bruit.     Palpations: Abdomen is soft. There is no mass.     Tenderness: There is no abdominal tenderness.     Hernia: No hernia is present.  Genitourinary:    Comments: Breast exam: No mass, nodules, thickening, tenderness, bulging, retraction, inflamation, nipple discharge or skin changes noted.  No axillary or clavicular LA.     Musculoskeletal:        General: No tenderness. Normal range of motion.     Cervical back: Normal range of motion and neck supple. No rigidity. No muscular tenderness.     Right lower leg: No edema.     Left lower leg: No edema.  Lymphadenopathy:     Cervical: No cervical adenopathy.  Skin:    General: Skin is warm and dry.     Coloration: Skin is not pale.     Findings: No erythema or rash.     Comments: Solar lentigines diffusely   Neurological:     Mental Status: She is alert. Mental status is at baseline.     Cranial Nerves: No cranial nerve deficit.     Motor: No abnormal muscle tone.     Coordination: Coordination normal.     Gait: Gait normal.     Deep Tendon Reflexes: Reflexes are normal and symmetric. Reflexes normal.  Psychiatric:        Mood and Affect: Mood normal.        Cognition and Memory: Cognition and memory normal.     Comments: Pleasant            Assessment & Plan:   Problem List Items Addressed This Visit      Cardiovascular and  Mediastinum   Inappropriate sinus tachycardia    This improved with anxiety treatment Still uses metoprolol prn if needed         Other   Routine general medical examination at a health care facility - Primary    Reviewed health habits including diet and exercise and skin cancer prevention Reviewed appropriate screening tests for age  Also reviewed health mt list, fam hx and immunization status , as well as social and family history  See HPI Labs reviewed  imms utd Allergic to covid vaccine Gyn visit and pap upcoming  Mammogram utd  Vit D level is tx  Doing well with better diet , enc to add exercise       Obesity (BMI 30-39.9)    Discussed how this problem influences overall health and the risks it imposes  Reviewed plan for weight loss with lower calorie diet (via better food choices and also portion control or program like weight watchers) and exercise building up to or more than 30 minutes 5 days per week including some aerobic activity   Pt is eating cleaner now and feels better, down 10 lb (counting macros)  Disc options for exercise and plans to start       Generalized anxiety disorder with panic attacks    Doing better under care of psychiatry Dr Lajoyce Corners Plans to continue zoloft 100 mg daily  Enc self care Reviewed stressors/ coping techniques/symptoms/ support sources       Vitamin D deficiency    Vitamin D level is therapeutic with current supplementation Disc importance of this to bone and overall health Level of 47.8 Will continue 5000 iu daily

## 2020-11-08 NOTE — Assessment & Plan Note (Signed)
This improved with anxiety treatment Still uses metoprolol prn if needed

## 2020-11-08 NOTE — Assessment & Plan Note (Signed)
Reviewed health habits including diet and exercise and skin cancer prevention Reviewed appropriate screening tests for age  Also reviewed health mt list, fam hx and immunization status , as well as social and family history   See HPI Labs reviewed  imms utd Allergic to covid vaccine Gyn visit and pap upcoming  Mammogram utd  Vit D level is tx  Doing well with better diet , enc to add exercise

## 2020-11-08 NOTE — Assessment & Plan Note (Signed)
Vitamin D level is therapeutic with current supplementation Disc importance of this to bone and overall health Level of 47.8 Will continue 5000 iu daily

## 2020-11-21 DIAGNOSIS — H5203 Hypermetropia, bilateral: Secondary | ICD-10-CM | POA: Diagnosis not present

## 2020-11-23 DIAGNOSIS — F909 Attention-deficit hyperactivity disorder, unspecified type: Secondary | ICD-10-CM | POA: Insufficient documentation

## 2020-11-23 DIAGNOSIS — F32A Depression, unspecified: Secondary | ICD-10-CM | POA: Insufficient documentation

## 2020-11-24 ENCOUNTER — Ambulatory Visit (INDEPENDENT_AMBULATORY_CARE_PROVIDER_SITE_OTHER): Payer: Self-pay | Admitting: Dermatology

## 2020-11-24 ENCOUNTER — Other Ambulatory Visit: Payer: Self-pay

## 2020-11-24 DIAGNOSIS — L988 Other specified disorders of the skin and subcutaneous tissue: Secondary | ICD-10-CM

## 2020-11-24 NOTE — Progress Notes (Signed)
   Follow-Up Visit   Subjective  Kimberly Torres is a 40 y.o. female who presents for the following: Procedure (Pt here for 1 month f/u on facial botox ).  The following portions of the chart were reviewed this encounter and updated as appropriate:   Tobacco  Allergies  Meds  Problems  Med Hx  Surg Hx  Fam Hx      Review of Systems:  No other skin or systemic complaints except as noted in HPI or Assessment and Plan.  Objective  Well appearing patient in no apparent distress; mood and affect are within normal limits.  A focused examination was performed including face. Relevant physical exam findings are noted in the Assessment and Plan.  face, Rhytides and volume loss.                       Assessment & Plan  Elastosis of skin face,  Crows feet 10 units   Intralesional injection - face, Location: See attached image  Informed consent: Discussed risks (infection, pain, bleeding, bruising, swelling, allergic reaction, paralysis of nearby muscles, eyelid droop, double vision, neck weakness, difficulty breathing, headache, undesirable cosmetic result, and need for additional treatment) and benefits of the procedure, as well as the alternatives.  Informed consent was obtained.  Preparation: The area was cleansed with alcohol.  Procedure Details:  Botox was injected into the dermis with a 30-gauge needle. Pressure applied to any bleeding. Ice packs offered for swelling.  Lot Number:  [id number] Expiration:  03/2022  Total Units Injected:  10  Plan: Patient was instructed to remain upright for 4 hours. Patient was instructed to avoid massaging the face and avoid vigorous exercise for the rest of the day. Tylenol may be used for headache.  Allow 2 weeks before returning to clinic for additional dosing as needed. Patient will call for any problems.   Return in about 3 months (around 02/24/2021) for Botox .  IMarye Round, CMA, am acting as scribe for  Sarina Ser, MD .  Documentation: I have reviewed the above documentation for accuracy and completeness, and I agree with the above.  Sarina Ser, MD

## 2020-11-24 NOTE — Patient Instructions (Signed)

## 2020-12-08 ENCOUNTER — Encounter: Payer: Self-pay | Admitting: Dermatology

## 2020-12-08 ENCOUNTER — Other Ambulatory Visit: Payer: Self-pay

## 2020-12-08 DIAGNOSIS — Z6833 Body mass index (BMI) 33.0-33.9, adult: Secondary | ICD-10-CM | POA: Diagnosis not present

## 2020-12-08 DIAGNOSIS — Z113 Encounter for screening for infections with a predominantly sexual mode of transmission: Secondary | ICD-10-CM | POA: Diagnosis not present

## 2020-12-08 DIAGNOSIS — R87610 Atypical squamous cells of undetermined significance on cytologic smear of cervix (ASC-US): Secondary | ICD-10-CM | POA: Diagnosis not present

## 2020-12-08 DIAGNOSIS — Z124 Encounter for screening for malignant neoplasm of cervix: Secondary | ICD-10-CM | POA: Diagnosis not present

## 2020-12-08 DIAGNOSIS — Z01419 Encounter for gynecological examination (general) (routine) without abnormal findings: Secondary | ICD-10-CM | POA: Diagnosis not present

## 2020-12-08 DIAGNOSIS — N898 Other specified noninflammatory disorders of vagina: Secondary | ICD-10-CM | POA: Diagnosis not present

## 2020-12-08 MED ORDER — LO LOESTRIN FE 1 MG-10 MCG / 10 MCG PO TABS
ORAL_TABLET | ORAL | 3 refills | Status: DC
  Filled 2020-12-08: qty 84, 84d supply, fill #0
  Filled 2021-03-01: qty 84, 84d supply, fill #1
  Filled 2021-09-05: qty 112, 84d supply, fill #2
  Filled 2021-12-07: qty 56, 42d supply, fill #3

## 2020-12-08 MED ORDER — FOLIC ACID 1 MG PO TABS
ORAL_TABLET | ORAL | 3 refills | Status: DC
  Filled 2020-12-08: qty 90, 90d supply, fill #0
  Filled 2021-03-01: qty 90, 90d supply, fill #1
  Filled 2021-05-12: qty 90, 90d supply, fill #2
  Filled 2021-09-05: qty 90, 90d supply, fill #3

## 2020-12-09 ENCOUNTER — Other Ambulatory Visit: Payer: Self-pay

## 2020-12-12 LAB — HM PAP SMEAR: HM Pap smear: NEGATIVE

## 2020-12-13 ENCOUNTER — Encounter: Payer: 59 | Admitting: Allergy & Immunology

## 2021-01-24 ENCOUNTER — Encounter: Payer: Self-pay | Admitting: Dermatology

## 2021-01-24 ENCOUNTER — Ambulatory Visit (INDEPENDENT_AMBULATORY_CARE_PROVIDER_SITE_OTHER): Payer: Self-pay | Admitting: Dermatology

## 2021-01-24 ENCOUNTER — Other Ambulatory Visit: Payer: Self-pay

## 2021-01-24 DIAGNOSIS — L988 Other specified disorders of the skin and subcutaneous tissue: Secondary | ICD-10-CM

## 2021-01-24 NOTE — Progress Notes (Signed)
   Follow-Up Visit   Subjective  Kimberly Torres is a 40 y.o. female who presents for the following: Facial Elastosis (Patient is here today for Botox injections.).  The following portions of the chart were reviewed this encounter and updated as appropriate:   Tobacco  Allergies  Meds  Problems  Med Hx  Surg Hx  Fam Hx     Review of Systems:  No other skin or systemic complaints except as noted in HPI or Assessment and Plan.  Objective  Well appearing patient in no apparent distress; mood and affect are within normal limits.  A focused examination was performed including the face. Relevant physical exam findings are noted in the Assessment and Plan.  Face Rhytides and volume loss.      Assessment & Plan  Elastosis of skin Face  Botox 35 units injected as marked: - Frown complex 20 units  - Crow's feet 10 units (5 each side) - forehead 5 u  Botox Injection - Face Location: See attached image  Informed consent: Discussed risks (infection, pain, bleeding, bruising, swelling, allergic reaction, paralysis of nearby muscles, eyelid droop, double vision, neck weakness, difficulty breathing, headache, undesirable cosmetic result, and need for additional treatment) and benefits of the procedure, as well as the alternatives.  Informed consent was obtained.  Preparation: The area was cleansed with alcohol.  Procedure Details:  Botox was injected into the dermis with a 30-gauge needle. Pressure applied to any bleeding. Ice packs offered for swelling.  Lot Number:  [id number] Expiration:  11/2022  Total Units Injected:  35  Plan: Tylenol may be used for headache.  Allow 2 weeks before returning to clinic for additional dosing as needed. Patient will call for any problems.   Return in about 4 months (around 05/26/2021) for Botox injections.  Luther Redo, CMA, am acting as scribe for Sarina Ser, MD . Documentation: I have reviewed the above documentation for accuracy  and completeness, and I agree with the above.  Sarina Ser, MD

## 2021-01-24 NOTE — Patient Instructions (Signed)

## 2021-02-17 ENCOUNTER — Other Ambulatory Visit (HOSPITAL_COMMUNITY): Payer: Self-pay

## 2021-02-17 MED ORDER — CARESTART COVID-19 HOME TEST VI KIT
PACK | 0 refills | Status: DC
  Filled 2021-02-17: qty 4, 4d supply, fill #0

## 2021-02-20 ENCOUNTER — Other Ambulatory Visit: Payer: Self-pay

## 2021-02-21 ENCOUNTER — Encounter: Payer: 59 | Admitting: Allergy & Immunology

## 2021-03-01 ENCOUNTER — Other Ambulatory Visit: Payer: Self-pay

## 2021-03-01 ENCOUNTER — Encounter: Payer: Self-pay | Admitting: Family Medicine

## 2021-03-02 ENCOUNTER — Other Ambulatory Visit: Payer: Self-pay

## 2021-03-22 ENCOUNTER — Encounter: Payer: Self-pay | Admitting: Family Medicine

## 2021-05-12 ENCOUNTER — Other Ambulatory Visit: Payer: Self-pay

## 2021-05-16 ENCOUNTER — Other Ambulatory Visit: Payer: Self-pay

## 2021-05-16 ENCOUNTER — Ambulatory Visit (INDEPENDENT_AMBULATORY_CARE_PROVIDER_SITE_OTHER): Payer: Self-pay | Admitting: Dermatology

## 2021-05-16 DIAGNOSIS — L988 Other specified disorders of the skin and subcutaneous tissue: Secondary | ICD-10-CM

## 2021-05-16 NOTE — Patient Instructions (Signed)

## 2021-05-16 NOTE — Progress Notes (Signed)
   Follow-Up Visit   Subjective  Kimberly Torres is a 40 y.o. female who presents for the following: Facial Elastosis (Face, pt presents for Botox today).  The following portions of the chart were reviewed this encounter and updated as appropriate:   Tobacco  Allergies  Meds  Problems  Med Hx  Surg Hx  Fam Hx     Review of Systems:  No other skin or systemic complaints except as noted in HPI or Assessment and Plan.  Objective  Well appearing patient in no apparent distress; mood and affect are within normal limits.  A focused examination was performed including face. Relevant physical exam findings are noted in the Assessment and Plan.f  The face Rhytides and volume loss.        Assessment & Plan  Elastosis of skin The face  Botox 35 units injected as marked: - Frown complex 20 units  - Crow's feet 10 units (5 each side) - Forehead 5 units   Filling material injection - The face Location: Frown complex, forehead, bil crow's feet  Informed consent: Discussed risks (infection, pain, bleeding, bruising, swelling, allergic reaction, paralysis of nearby muscles, eyelid droop, double vision, neck weakness, difficulty breathing, headache, undesirable cosmetic result, and need for additional treatment) and benefits of the procedure, as well as the alternatives.  Informed consent was obtained.  Preparation: The area was cleansed with alcohol.  Procedure Details:  Botox was injected into the dermis with a 30-gauge needle. Pressure applied to any bleeding. Ice packs offered for swelling.  Lot Number:  [id number] Expiration:  08/2023  Total Units Injected:  35  Plan: Patient was instructed to remain upright for 4 hours. Patient was instructed to avoid massaging the face and avoid vigorous exercise for the rest of the day. Tylenol may be used for headache.  Allow 2 weeks before returning to clinic for additional dosing as needed. Patient will call for any problems.  Return in  about 4 months (around 09/14/2021) for Botox injections.  Luther Redo, CMA, am acting as scribe for Sarina Ser, MD . Documentation: I have reviewed the above documentation for accuracy and completeness, and I agree with the above.  Sarina Ser, MD

## 2021-05-21 ENCOUNTER — Encounter: Payer: Self-pay | Admitting: Dermatology

## 2021-05-25 ENCOUNTER — Other Ambulatory Visit: Payer: Self-pay

## 2021-05-25 DIAGNOSIS — D2262 Melanocytic nevi of left upper limb, including shoulder: Secondary | ICD-10-CM | POA: Diagnosis not present

## 2021-05-25 DIAGNOSIS — D2271 Melanocytic nevi of right lower limb, including hip: Secondary | ICD-10-CM | POA: Diagnosis not present

## 2021-05-25 DIAGNOSIS — L309 Dermatitis, unspecified: Secondary | ICD-10-CM | POA: Diagnosis not present

## 2021-05-25 DIAGNOSIS — L821 Other seborrheic keratosis: Secondary | ICD-10-CM | POA: Diagnosis not present

## 2021-05-25 DIAGNOSIS — D2272 Melanocytic nevi of left lower limb, including hip: Secondary | ICD-10-CM | POA: Diagnosis not present

## 2021-05-25 DIAGNOSIS — D2261 Melanocytic nevi of right upper limb, including shoulder: Secondary | ICD-10-CM | POA: Diagnosis not present

## 2021-05-25 DIAGNOSIS — D225 Melanocytic nevi of trunk: Secondary | ICD-10-CM | POA: Diagnosis not present

## 2021-05-25 MED ORDER — MOMETASONE FUROATE 0.1 % EX SOLN
CUTANEOUS | 2 refills | Status: AC
  Filled 2021-05-25: qty 30, 30d supply, fill #0
  Filled 2022-03-02: qty 30, 20d supply, fill #1

## 2021-06-01 ENCOUNTER — Other Ambulatory Visit: Payer: Self-pay

## 2021-06-01 ENCOUNTER — Other Ambulatory Visit (HOSPITAL_COMMUNITY): Payer: Self-pay

## 2021-06-01 MED ORDER — CARESTART COVID-19 HOME TEST VI KIT
PACK | 0 refills | Status: DC
  Filled 2021-06-01: qty 4, 4d supply, fill #0

## 2021-06-01 MED ORDER — PAXLOVID (300/100) 20 X 150 MG & 10 X 100MG PO TBPK
3.0000 | ORAL_TABLET | Freq: Two times a day (BID) | ORAL | 0 refills | Status: DC
  Filled 2021-06-01: qty 30, 5d supply, fill #0

## 2021-06-03 ENCOUNTER — Emergency Department (HOSPITAL_BASED_OUTPATIENT_CLINIC_OR_DEPARTMENT_OTHER): Payer: 59

## 2021-06-03 ENCOUNTER — Other Ambulatory Visit: Payer: Self-pay

## 2021-06-03 ENCOUNTER — Emergency Department (HOSPITAL_BASED_OUTPATIENT_CLINIC_OR_DEPARTMENT_OTHER)
Admission: EM | Admit: 2021-06-03 | Discharge: 2021-06-03 | Disposition: A | Discharge status: Home / Self Care | Payer: 59 | Attending: Emergency Medicine | Admitting: Emergency Medicine

## 2021-06-03 ENCOUNTER — Encounter (HOSPITAL_BASED_OUTPATIENT_CLINIC_OR_DEPARTMENT_OTHER): Payer: Self-pay

## 2021-06-03 DIAGNOSIS — Z79899 Other long term (current) drug therapy: Secondary | ICD-10-CM | POA: Insufficient documentation

## 2021-06-03 DIAGNOSIS — N9489 Other specified conditions associated with female genital organs and menstrual cycle: Secondary | ICD-10-CM | POA: Insufficient documentation

## 2021-06-03 DIAGNOSIS — Z8616 Personal history of COVID-19: Secondary | ICD-10-CM | POA: Insufficient documentation

## 2021-06-03 DIAGNOSIS — R0789 Other chest pain: Secondary | ICD-10-CM | POA: Diagnosis not present

## 2021-06-03 DIAGNOSIS — R079 Chest pain, unspecified: Secondary | ICD-10-CM

## 2021-06-03 DIAGNOSIS — R0602 Shortness of breath: Secondary | ICD-10-CM | POA: Diagnosis not present

## 2021-06-03 DIAGNOSIS — U071 COVID-19: Secondary | ICD-10-CM | POA: Diagnosis not present

## 2021-06-03 LAB — CBC WITH DIFFERENTIAL/PLATELET
Abs Immature Granulocytes: 0.02 10*3/uL (ref 0.00–0.07)
Basophils Absolute: 0 10*3/uL (ref 0.0–0.1)
Basophils Relative: 1 %
Eosinophils Absolute: 0.1 10*3/uL (ref 0.0–0.5)
Eosinophils Relative: 1 %
HCT: 38.9 % (ref 36.0–46.0)
Hemoglobin: 12.8 g/dL (ref 12.0–15.0)
Immature Granulocytes: 0 %
Lymphocytes Relative: 23 %
Lymphs Abs: 1.4 10*3/uL (ref 0.7–4.0)
MCH: 29.6 pg (ref 26.0–34.0)
MCHC: 32.9 g/dL (ref 30.0–36.0)
MCV: 90 fL (ref 80.0–100.0)
Monocytes Absolute: 0.5 10*3/uL (ref 0.1–1.0)
Monocytes Relative: 8 %
Neutro Abs: 4.1 10*3/uL (ref 1.7–7.7)
Neutrophils Relative %: 67 %
Platelets: 285 10*3/uL (ref 150–400)
RBC: 4.32 MIL/uL (ref 3.87–5.11)
RDW: 12.6 % (ref 11.5–15.5)
WBC: 6.2 10*3/uL (ref 4.0–10.5)
nRBC: 0 % (ref 0.0–0.2)

## 2021-06-03 LAB — COMPREHENSIVE METABOLIC PANEL
ALT: 9 U/L (ref 0–44)
AST: 10 U/L — ABNORMAL LOW (ref 15–41)
Albumin: 3.9 g/dL (ref 3.5–5.0)
Alkaline Phosphatase: 51 U/L (ref 38–126)
Anion gap: 7 (ref 5–15)
BUN: 10 mg/dL (ref 6–20)
CO2: 26 mmol/L (ref 22–32)
Calcium: 9 mg/dL (ref 8.9–10.3)
Chloride: 104 mmol/L (ref 98–111)
Creatinine, Ser: 0.73 mg/dL (ref 0.44–1.00)
GFR, Estimated: >60 mL/min (ref >60)
Glucose, Bld: 103 mg/dL — ABNORMAL HIGH (ref 70–99)
Potassium: 4 mmol/L (ref 3.5–5.1)
Sodium: 137 mmol/L (ref 135–145)
Total Bilirubin: 0.3 mg/dL (ref 0.3–1.2)
Total Protein: 7 g/dL (ref 6.5–8.1)

## 2021-06-03 LAB — LIPASE, BLOOD: Lipase: 19 U/L (ref 11–51)

## 2021-06-03 LAB — LACTIC ACID, PLASMA: Lactic Acid, Venous: 1.1 mmol/L (ref 0.5–1.9)

## 2021-06-03 LAB — TROPONIN I (HIGH SENSITIVITY)
Troponin I (High Sensitivity): <2 ng/L (ref <18)
Troponin I (High Sensitivity): <2 ng/L (ref <18)

## 2021-06-03 LAB — HCG, SERUM, QUALITATIVE: Preg, Serum: NEGATIVE

## 2021-06-03 MED ORDER — CYCLOBENZAPRINE HCL 10 MG PO TABS: 10.0000 mg | ORAL_TABLET | Freq: Two times a day (BID) | ORAL | 0 refills | Status: DC | PRN

## 2021-06-03 MED ORDER — SODIUM CHLORIDE 0.9 % IV BOLUS: 1000.0000 mL | Freq: Once | INTRAVENOUS | Status: DC

## 2021-06-03 MED ORDER — IOHEXOL 350 MG/ML SOLN
100.0000 mL | Freq: Once | INTRAVENOUS | Status: AC | PRN
  Administered 2021-06-03: 70 mL via INTRAVENOUS

## 2021-06-03 NOTE — ED Provider Notes (Signed)
[facility] Provider Note   CSN: [id number] Arrival date & time: 06/03/21  4193     History No chief complaint on file.   Kimberly Torres is a 40 y.o. female.  The history is provided by the patient and medical records. No language interpreter was used.  Chest Pain Pain location:  L chest Pain quality: aching, radiating (houlder) and sharp   Pain radiates to:  L shoulder Pain severity:  Severe Onset quality:  Sudden Timing:  Intermittent Progression:  Waxing and waning Chronicity:  New Context: breathing   Relieved by:  Nothing Worsened by:  Coughing and deep breathing Ineffective treatments:  None tried Associated symptoms: cough (mild) and shortness of breath   Associated symptoms: no abdominal pain, no back pain, no diaphoresis, no fatigue, no fever, no headache, no nausea, no near-syncope, no palpitations and no vomiting       Past Medical History:  Diagnosis Date   Acne    sees derm- on accutane   Anxiety    Chondromalacia of right knee    right   Depression    Dysplastic nevus 05/16/2009   R deltoid - moderate   Dysplastic nevus 09/06/2009   R med knee - moderate   Dysrhythmia    hx SVT   H/O seasonal allergies    Headache(784.0)    cluster migraines   Insomnia    Sinus tachycardia    eval by Dr. Stanford Breed in 2007: monior revealed sinus tach. vs EAT; Echo 10/07: EF 70%   Varicose veins    eval. 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    Cluster headache 02/15/2012   Routine general medical examination at a health care facility 01/28/2012   Sinus tachycardia 09/11/2010   HEMANGIOMA, HEPATIC 03/14/2009   Attention deficit hyperactivity disorder (ADHD) 06/25/2007   SCOLIOSIS 06/25/2007    Past Surgical History:  Procedure Laterality Date   CESAREAN SECTION  2009   DIAGNOSTIC LAPAROSCOPY  2010   scar tissue   KNEE SURGERY  04/10/2010   LUMBAR LAMINECTOMY/DECOMPRESSION MICRODISCECTOMY Right 09/28/2013   Procedure: LUMBAR LAMINECTOMY/DECOMPRESSION MICRODISCECTOMY 1 LEVEL,LUMBAR  THREE-FOUR RIGHT ;  Surgeon: Charlie Pitter, MD;  Location: [facility];  Service: Neurosurgery;  Laterality: Right;  right   MYRINGOTOMY       OB History   No obstetric history on file.     Family History  Problem Relation Age of Onset   Hypertension Mother    ADD / ADHD Mother    Coronary artery disease Other        grandfather   Heart attack Other        grandfather   Breast cancer Other        grandmother   Bipolar disorder Other        grandmother   ADD / ADHD Sister    COPD Maternal Grandfather    Breast cancer Maternal Grandmother 70    Social History   Tobacco Use   Smoking status: Never   Smokeless tobacco: Never  Vaping Use  Vaping Use: Never used  Substance Use Topics   Alcohol use: Yes    Alcohol/week: 0.0 standard drinks    Comment: occasional to rare   Drug use: No    Home Medications Prior to Admission medications   Medication Sig Start Date End Date Taking? Authorizing Provider  Cholecalciferol (DIALYVITE VITAMIN D 5000 PO) Take by mouth daily.    [provider]  COVID-19 At Home Antigen Test [facility] COVID-19 HOME TEST) KIT Use as directed 02/17/21   Edmon Crape, [facility]  COVID-19 At Home Antigen Test [facility] COVID-19 HOME TEST) KIT Use as directed 06/01/21   Edmon Crape, [facility]  EPINEPHrine 0.3 mg/0.3 mL IJ SOAJ injection Inject 0.3 mLs (0.3 mg total) into the muscle as needed for  anaphylaxis. 06/01/19   Merlyn Lot, MD  fluticasone [facility]) 50 MCG/ACT nasal spray Place 2 sprays into both nostrils daily as needed for allergies.     [provider]  folic acid (FOLVITE) 1 MG tablet Take 1 mg by mouth daily.    [provider]  folic acid (FOLVITE) 1 MG tablet Take 1 tablet by mouth every day 12/08/20     melatonin 5 MG TABS Take 5 mg by mouth at bedtime as needed.    [provider]  metoprolol tartrate (LOPRESSOR) 25 MG tablet TAKE 1 TABLET BY MOUTH 2 TIMES DAILY AS NEEDED FOR ELEVATED BLOOD PRESSURE, HEADACHE OR HIGH PULSE RATE 07/27/19 07/26/20  Tower, Wynelle Fanny, MD  mometasone (ELOCON) 0.1 % lotion Apply daily to affected areas on scalp as needed 05/25/21     nirmatrelvir & ritonavir (PAXLOVID, 300/100,) 20 x 150 MG & 10 x 100MG TBPK Take 3 tablets by mouth 2 (two) times daily. 06/01/21   Juanito Doom, MD  Norethindrone-Ethinyl Estradiol-Fe Biphas (LO LOESTRIN FE) 1 MG-10 MCG / 10 MCG tablet TAKE 1 TABLET BY MOUTH DAILY. TAKE CONTINUOUS ACTIVE TABLETS FOR 3 PACKS THEN PLACEBO TABLETS 08/25/19 08/24/20  Aloha Gell, MD  Norethindrone-Ethinyl Estradiol-Fe Biphas (LO LOESTRIN FE) 1 MG-10 MCG / 10 MCG tablet Take 1 tablet every day by oral route. 12/08/20     Omega-3 Fatty Acids (FISH OIL) 1000 MG CAPS Take 2 capsules by mouth daily. 02/02/17   [provider]  Probiotic Product (CULTURELLE PROBIOTICS PO) Take 1 tablet by mouth daily.    [provider]  sertraline (ZOLOFT) 100 MG tablet TAKE 1 TABLET BY MOUTH ONCE DAILY WITH BREAKFAST 08/31/19 08/30/20  Chauncey Mann, MD  tretinoin (RETIN-A) 0.025 % cream Apply topically 3 (three) times a week.  03/11/18   [provider]    Allergies    Covid-19 mrna vacc (moderna) and Triamcinolone acetonide  Review of Systems   Review of Systems  Constitutional:  Negative for chills, diaphoresis, fatigue and fever.  HENT:  Negative for congestion.   Respiratory:  Positive for  cough (mild) and shortness of breath. Negative for chest tightness and wheezing.   Cardiovascular:  Positive for chest pain. Negative for palpitations, leg swelling and near-syncope.  Gastrointestinal:  Negative for abdominal pain, diarrhea, nausea and vomiting.  Genitourinary:  Negative for dysuria.  Musculoskeletal:  Negative for back pain, neck pain and neck stiffness.  Skin:  Negative for rash and wound.  Neurological:  Negative for headaches.  Psychiatric/Behavioral:  Negative for agitation.   All other systems reviewed and are negative.  Physical Exam Updated Vital Signs BP 140/87    Pulse (!) 101    Temp 98.8 F (37.1 C) (  Oral)    Resp 20    SpO2 100%   Physical Exam Vitals and nursing note reviewed.  Constitutional:      General: She is not in acute distress.    Appearance: She is well-developed. She is not ill-appearing, toxic-appearing or diaphoretic.  HENT:     Head: Normocephalic and atraumatic.     Mouth/Throat:     Mouth: Mucous membranes are moist.  Eyes:     Conjunctiva/sclera: Conjunctivae normal.     Pupils: Pupils are equal, round, and reactive to light.  Cardiovascular:     Rate and Rhythm: Regular rhythm. Tachycardia present.     Heart sounds: No murmur heard. Pulmonary:     Effort: Pulmonary effort is normal. No respiratory distress.     Breath sounds: Normal breath sounds. No wheezing, rhonchi or rales.  Chest:     Chest wall: Tenderness (with deep palpation) present.  Abdominal:     Palpations: Abdomen is soft.     Tenderness: There is no abdominal tenderness. There is no right CVA tenderness, left CVA tenderness, guarding or rebound.  Musculoskeletal:        General: No swelling, tenderness or signs of injury.     Cervical back: Neck supple. No tenderness.     Right lower leg: No edema.     Left lower leg: No edema.  Skin:    General: Skin is warm and dry.     Capillary Refill: Capillary refill takes less than 2 seconds.     Findings: No erythema  or rash.  Neurological:     Mental Status: She is alert. Mental status is at baseline.  Psychiatric:        Mood and Affect: Mood normal.    ED Results / Procedures / Treatments   Labs (all labs ordered are listed, but only abnormal results are displayed) Labs Reviewed  COMPREHENSIVE METABOLIC PANEL - Abnormal; Notable for the following components:      Result Value   Glucose, Bld 103 (*)    AST 10 (*)    All other components within normal limits  CBC WITH DIFFERENTIAL/PLATELET  LIPASE, BLOOD  LACTIC ACID, PLASMA  HCG, SERUM, QUALITATIVE  LACTIC ACID, PLASMA  TROPONIN I (HIGH SENSITIVITY)  TROPONIN I (HIGH SENSITIVITY)    EKG None ED ECG REPORT   Date: 06/03/2021  Rate: 95  Rhythm: normal sinus rhythm  QRS Axis: normal  Intervals: normal  ST/T Wave abnormalities: normal  Conduction Disutrbances:none  Narrative Interpretation:   Old EKG Reviewed: unchanged  I have personally reviewed the EKG tracing and agree with the computerized printout as noted.   Radiology CT Angio Chest PE W and/or Wo Contrast  Result Date: 06/03/2021 CLINICAL DATA:  Rule out pulmonary embolus. Pleuritic chest pain and shortness of breath. EXAM: CT ANGIOGRAPHY CHEST WITH CONTRAST TECHNIQUE: Multidetector CT imaging of the chest was performed using the standard protocol during bolus administration of intravenous contrast. Multiplanar CT image reconstructions and MIPs were obtained to evaluate the vascular anatomy. CONTRAST:  10m OMNIPAQUE IOHEXOL 350 MG/ML SOLN COMPARISON:  None. FINDINGS: Cardiovascular: Satisfactory opacification of the pulmonary arteries to the segmental level. No evidence of pulmonary embolism. Normal heart size. No pericardial effusion. Mediastinum/Nodes: No enlarged mediastinal, hilar, or axillary lymph nodes. Thyroid gland, trachea, and esophagus demonstrate no significant findings. Lungs/Pleura: No pleural effusion, airspace consolidation, or atelectasis. No suspicious  pulmonary nodule or mass identified. Upper Abdomen: No acute abnormality. Musculoskeletal: No chest wall abnormality. No acute or significant osseous  findings. Review of the MIP images confirms the above findings. IMPRESSION: No evidence for acute pulmonary embolus. Electronically Signed   By: Kerby Moors M.D.   On: 06/03/2021 11:16    Procedures Procedures   Medications Ordered in ED Medications  sodium chloride 0.9 % bolus 1,000 mL (has no administration in time range)  iohexol (OMNIPAQUE) 350 MG/ML injection 100 mL (70 mLs Intravenous Contrast Given 06/03/21 1026)    ED Course  I have reviewed the triage vital signs and the nursing notes.  Pertinent labs & imaging results that were available during my care of the patient were reviewed by me and considered in my medical decision making (see chart for details).    MDM Rules/Calculators/A&P                           Corin Formisano is a 40 y.o. 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  According to patient, she is on day 3 of COVID positivity after testing for fatigue, generalized soreness, sore throat, and some congestion.  She denies significant coughing but has had a small amount.  No production reported.  She says that overnight she started having discomfort in her left chest that is a sharp pain that goes towards her left shoulder and is very pleuritic with some shortness of breath.  She denies any exertional component and denies any leg pain, leg swelling, arm pain, arm swelling.  She denies nausea, vomiting, constipation, diarrhea, or urinary changes.  Describes the pain as moderate to severe at times.  She has not been feeling with severe palpitations like she has had with SVT in the past but is concerned as she  reports a paternal grandfather had MI in his early 25s.  On exam, lungs are clear.  Chest was tender only with significant palpation.  No murmur appreciated.  Good pulses in extremities.  Abdomen nontender.  Back nontender.  Flanks nontender.  Patient otherwise well-appearing but she was tachycardic on arrival around 118 and was tachypneic.  EKG did not show STEMI.  Given the patient's tachycardia, tachypnea, pleuritic chest discomfort that started suddenly, COVID positivity, and description of symptoms I am somewhat concerned about pulmonary embolism.  I also would consider concern for chest pain being related to a muscle pain after a coughing episode given the COVID.    We had a shared decision-making conversation and agreed to get labs including troponins, and get a CT PE study as I think she is too high risk based on her story for a D-dimer at this time.  Patient agrees.  If work-up is reassuring, anticipate discharge with likely musculoskeletal pains but will wait for work-up to complete first.  Patient CT PE study was negative.  Patient's heart rate improved after fluids.  Otherwise work-up reassuring including delta troponin.  Suspect musculoskeletal pain from coughing and patient agrees.  We feel she is now safe for discharge home.  We discussed further oral outpatient COVID medication versus some muscle medications and she would like to try some Flexeril.  Will prescribe this and have her follow-up with her PCP.  Patient agrees and understands her precautions.  Patient discharged in good condition.    Final Clinical Impression(s) / ED Diagnoses Final diagnoses:  Left-sided chest pain  COVID-19    Rx / DC Orders ED Discharge Orders          Ordered    cyclobenzaprine (FLEXERIL) 10 MG tablet  2 times daily PRN        06/03/21 1325            Clinical Impression: 1. Left-sided chest pain   2. COVID-19     Disposition: Discharge  Condition: Good  I have discussed  the results, Dx and Tx plan with the pt(& family if present). He/she/they expressed understanding and agree(s) with the plan. Discharge instructions discussed at great length. Strict return precautions discussed and pt &/or family have verbalized understanding of the instructions. No further questions at time of discharge.    New Prescriptions   CYCLOBENZAPRINE (FLEXERIL) 10 MG TABLET    Take 1 tablet (10 mg total) by mouth 2 (two) times daily as needed for muscle spasms.    Follow Up: Tower, Wynelle Fanny, MD [place] [address] [address] [facility] [place] [address] [phone]       Lawrencia Mauney, Gwenyth Allegra, MD 06/03/21 [phone]

## 2021-06-03 NOTE — ED Triage Notes (Signed)
She states she was recently dx with COVID. She c/o some chest discomfort; worse with cough. She is ambulatory and in no distress.

## 2021-06-03 NOTE — Discharge Instructions (Signed)
Your work-up today was overall reassuring.  The CT scan did not show any evidence of blood clot or other acute abnormality in your chest.  Clinically I suspect you have more musculoskeletal pain likely due to some of the coughing as you are getting over COVID versus pain from the [place] itself.  There is no evidence of pneumonia and no pneumothorax.  Your other labs are reassuring.  As your vital signs have improved and you appear well, we feel you are safe for discharge home.  Please consider filling the prescription for muscle relaxant to help if it persists.  Please help with your primary doctor.  If any symptoms change or worsen acutely, please return to the nearest emergency department.

## 2021-06-08 ENCOUNTER — Encounter: Payer: Self-pay | Admitting: Family Medicine

## 2021-06-08 DIAGNOSIS — N6489 Other specified disorders of breast: Secondary | ICD-10-CM

## 2021-06-09 ENCOUNTER — Encounter: Payer: Self-pay | Admitting: Family Medicine

## 2021-06-13 ENCOUNTER — Encounter: Payer: Self-pay | Admitting: Family Medicine

## 2021-06-13 DIAGNOSIS — N6489 Other specified disorders of breast: Secondary | ICD-10-CM

## 2021-06-14 NOTE — Telephone Encounter (Signed)
Dr Glori Bickers I pulled down the imaging code that is the right one per [facility] for review. With [facility] for orders they want bilateral diagnostic not just one side.

## 2021-06-16 ENCOUNTER — Other Ambulatory Visit (HOSPITAL_COMMUNITY): Payer: Self-pay

## 2021-06-16 ENCOUNTER — Other Ambulatory Visit: Payer: Self-pay

## 2021-06-16 MED ORDER — WEGOVY 0.25 MG/0.5ML ~~LOC~~ SOAJ
SUBCUTANEOUS | 0 refills | Status: DC
  Filled 2021-06-16 (×2): qty 2, 28d supply, fill #0

## 2021-06-21 ENCOUNTER — Other Ambulatory Visit: Payer: Self-pay

## 2021-06-21 MED ORDER — WEGOVY 0.25 MG/0.5ML ~~LOC~~ SOAJ
SUBCUTANEOUS | 0 refills | Status: DC
  Filled 2021-06-21 – 2021-06-22 (×2): qty 2, 28d supply, fill #0

## 2021-06-22 ENCOUNTER — Other Ambulatory Visit: Payer: Self-pay

## 2021-06-29 DIAGNOSIS — H1820 Unspecified corneal edema: Secondary | ICD-10-CM | POA: Diagnosis not present

## 2021-07-05 ENCOUNTER — Other Ambulatory Visit: Payer: Self-pay

## 2021-07-07 ENCOUNTER — Other Ambulatory Visit: Payer: Self-pay

## 2021-07-10 ENCOUNTER — Other Ambulatory Visit: Payer: Self-pay

## 2021-07-10 MED ORDER — WEGOVY 0.5 MG/0.5ML ~~LOC~~ SOAJ
SUBCUTANEOUS | 0 refills | Status: DC
  Filled 2021-07-10: qty 2, 28d supply, fill #0

## 2021-07-14 ENCOUNTER — Other Ambulatory Visit: Payer: Self-pay

## 2021-07-17 ENCOUNTER — Ambulatory Visit
Admission: RE | Admit: 2021-07-17 | Discharge: 2021-07-17 | Disposition: A | Discharge status: Home / Self Care | Payer: 59 | Source: Ambulatory Visit | Attending: Family Medicine | Admitting: Family Medicine

## 2021-07-17 ENCOUNTER — Other Ambulatory Visit: Payer: Self-pay

## 2021-07-17 DIAGNOSIS — N6489 Other specified disorders of breast: Secondary | ICD-10-CM | POA: Insufficient documentation

## 2021-07-17 DIAGNOSIS — R922 Inconclusive mammogram: Secondary | ICD-10-CM | POA: Diagnosis not present

## 2021-08-03 ENCOUNTER — Other Ambulatory Visit: Payer: Self-pay

## 2021-08-03 MED ORDER — WEGOVY 1 MG/0.5ML ~~LOC~~ SOAJ
SUBCUTANEOUS | 0 refills | Status: DC
  Filled 2021-08-03: qty 2, 28d supply, fill #0

## 2021-08-18 ENCOUNTER — Other Ambulatory Visit: Payer: Self-pay

## 2021-08-18 ENCOUNTER — Telehealth: Payer: 59 | Admitting: Physician Assistant

## 2021-08-18 DIAGNOSIS — J208 Acute bronchitis due to other specified organisms: Secondary | ICD-10-CM

## 2021-08-18 DIAGNOSIS — B9689 Other specified bacterial agents as the cause of diseases classified elsewhere: Secondary | ICD-10-CM | POA: Diagnosis not present

## 2021-08-18 MED ORDER — BENZONATATE 100 MG PO CAPS
100.0000 mg | ORAL_CAPSULE | Freq: Three times a day (TID) | ORAL | 0 refills | Status: DC | PRN
  Filled 2021-08-18: qty 30, 10d supply, fill #0

## 2021-08-18 MED ORDER — FLUCONAZOLE 150 MG PO TABS
150.0000 mg | ORAL_TABLET | Freq: Once | ORAL | 0 refills | Status: AC
Start: 2021-08-18 — End: 2021-08-19
  Filled 2021-08-18: qty 2, 3d supply, fill #0

## 2021-08-18 MED ORDER — AZITHROMYCIN 250 MG PO TABS
ORAL_TABLET | ORAL | 0 refills | Status: AC
  Filled 2021-08-18: qty 6, 5d supply, fill #0

## 2021-08-18 MED ORDER — PREDNISONE 20 MG PO TABS
40.0000 mg | ORAL_TABLET | Freq: Every day | ORAL | 0 refills | Status: DC
  Filled 2021-08-18: qty 10, 5d supply, fill #0

## 2021-08-18 MED ORDER — PROMETHAZINE-DM 6.25-15 MG/5ML PO SYRP
5.0000 mL | ORAL_SOLUTION | Freq: Four times a day (QID) | ORAL | 0 refills | Status: DC | PRN
Start: 2021-08-18 — End: 2022-03-29
  Filled 2021-08-18: qty 118, 6d supply, fill #0

## 2021-08-18 NOTE — Progress Notes (Signed)
Virtual Visit Consent   Kimberly Torres, you are scheduled for a virtual visit with a [facility] provider today.     Just as with appointments in the office, your consent must be obtained to participate.  Your consent will be active for this visit and any virtual visit you may have with one of our providers in the next 365 days.     If you have a MyChart account, a copy of this consent can be sent to you electronically.  All virtual visits are billed to your insurance company just like a traditional visit in the office.    As this is a virtual visit, video technology does not allow for your provider to perform a traditional examination.  This may limit your provider's ability to fully assess your condition.  If your provider identifies any concerns that need to be evaluated in person or the need to arrange testing (such as labs, EKG, etc.), we will make arrangements to do so.     Although advances in technology are sophisticated, we cannot ensure that it will always work on either your end or our end.  If the connection with a video visit is poor, the visit may have to be switched to a telephone visit.  With either a video or telephone visit, we are not always able to ensure that we have a secure connection.     I need to obtain your verbal consent now.   Are you willing to proceed with your visit today?    Kimberly Torres has provided verbal consent on 08/18/2021 for a virtual visit (video or telephone).   Kimberly Loveless, PA-C   Date: 08/18/2021 8:49 AM   Virtual Visit via Video Note   I, Kimberly Torres, connected with  Kimberly Torres  ([id number], 10-14-1980) on 08/18/21 at  9:15 AM EDT by a video-enabled telemedicine application and verified that I am speaking with the correct person using two identifiers.  Location: Patient: work; Games developer: Engineer, mining Provider: Home Office   I discussed the limitations of evaluation and  management by telemedicine and the availability of in person appointments. The patient expressed understanding and agreed to proceed.    History of Present Illness: Kimberly Torres is a 41 y.o. who identifies as a female who was assigned female at birth, and is being seen today for possible sinus infection and cough.  HPI: Sinusitis This is a new problem. The current episode started 1 to 4 weeks ago. The problem has been gradually worsening since onset. There has been no fever. The pain is mild. Associated symptoms include congestion, coughing (was productive but now dry), ear pain, headaches, sinus pressure and a sore throat. Pertinent negatives include no chills or hoarse voice. (Back pain from coughing, rhinorrhea, post nasal drainage) Past treatments include oral decongestants (fexofenadine, ipratropium, delsym, sudafed, flonase,astelin nasal spray). The treatment provided no relief.     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   Cluster headache 02/15/2012   Routine general medical examination at a health care facility 01/28/2012   Sinus tachycardia 09/11/2010  HEMANGIOMA, HEPATIC 03/14/2009   Attention deficit hyperactivity disorder (ADHD) 06/25/2007   SCOLIOSIS 06/25/2007    Allergies:  Allergies  Allergen Reactions   Covid-19 Mrna Vacc (Moderna) Anaphylaxis    WAS GIVEN THE PFIZER NOT MODERNA   Triamcinolone Acetonide Anaphylaxis   Medications:  Current Outpatient Medications:    azithromycin (ZITHROMAX) 250 MG tablet, Take 2 tablets on day 1,  then 1 tablet daily on days 2 through 5, Disp: 6 tablet, Rfl: 0   benzonatate (TESSALON) 100 MG capsule, Take 1 capsule (100 mg total) by mouth 3 (three) times daily as needed., Disp: 30 capsule, Rfl: 0   fluconazole (DIFLUCAN) 150 MG tablet, Take 1 tablet (150 mg total) by mouth once for 1 dose. May repeat 72 hours if needed, Disp: 2 tablet, Rfl: 0   predniSONE (DELTASONE) 20 MG tablet, Take 2 tablets (40 mg total) by mouth daily with breakfast., Disp: 10 tablet, Rfl: 0   promethazine-dextromethorphan (PROMETHAZINE-DM) 6.25-15 MG/5ML syrup, Take 5 mLs by mouth 4 (four) times daily as needed., Disp: 118 mL, Rfl: 0   Cholecalciferol (DIALYVITE VITAMIN D 5000 PO), Take by mouth daily., Disp: , Rfl:    COVID-19 At Home Antigen Test (CARESTART COVID-19 HOME TEST) KIT, Use as directed, Disp: 4 each, Rfl: 0   COVID-19 At Home Antigen Test (CARESTART COVID-19 HOME TEST) KIT, Use as directed, Disp: 4 each, Rfl: 0   cyclobenzaprine (FLEXERIL) 10 MG tablet, Take 1 tablet (10 mg total) by mouth 2 (two) times daily as needed for muscle spasms., Disp: 20 tablet, Rfl: 0   EPINEPHrine 0.3 mg/0.3 mL IJ SOAJ injection, Inject 0.3 mLs (0.3 mg total) into the muscle as needed for anaphylaxis., Disp: 1 each, Rfl: 1   fluticasone (FLONASE) 50 MCG/ACT nasal spray, Place 2 sprays into both nostrils daily as needed for allergies. , Disp: , Rfl:    folic acid (FOLVITE) 1 MG tablet, Take 1 mg by mouth daily., Disp: , Rfl:    folic acid (FOLVITE) 1 MG tablet, Take 1 tablet by mouth every day, Disp: 90 tablet, Rfl: 3   melatonin 5 MG TABS, Take 5 mg by mouth at bedtime as needed., Disp: , Rfl:    metoprolol tartrate (LOPRESSOR) 25 MG tablet, TAKE 1 TABLET BY MOUTH 2 TIMES DAILY AS NEEDED FOR ELEVATED BLOOD PRESSURE, HEADACHE OR HIGH PULSE RATE, Disp: 180 tablet, Rfl: 3   mometasone (ELOCON) 0.1 % lotion, Apply daily to affected areas on scalp as needed, Disp: 30 mL, Rfl: 2   nirmatrelvir & ritonavir (PAXLOVID, 300/100,) 20 x  150 MG & 10 x 100MG  TBPK, Take 3 tablets by mouth 2 (two) times daily., Disp: 30 tablet, Rfl: 0   Norethindrone-Ethinyl Estradiol-Fe Biphas (LO LOESTRIN FE) 1 MG-10 MCG / 10 MCG tablet, TAKE 1 TABLET BY MOUTH DAILY. TAKE CONTINUOUS ACTIVE TABLETS FOR 3 PACKS THEN PLACEBO TABLETS, Disp: 112 tablet, Rfl: 4   Norethindrone-Ethinyl Estradiol-Fe Biphas (LO LOESTRIN FE) 1 MG-10 MCG / 10 MCG tablet, Take 1 tablet every day by oral route., Disp: 84 tablet, Rfl: 3   Omega-3 Fatty Acids (FISH OIL) 1000 MG CAPS, Take 2 capsules by mouth daily., Disp: , Rfl:    Probiotic Product (CULTURELLE PROBIOTICS PO), Take 1 tablet by mouth daily., Disp: , Rfl:    sertraline (ZOLOFT) 100 MG tablet, TAKE 1 TABLET BY MOUTH ONCE DAILY WITH BREAKFAST, Disp: 90 tablet, Rfl: 1   tretinoin (RETIN-A) 0.025 % cream, Apply topically 3 (three) times a week. , Disp: , Rfl:  WEGOVY 0.25 MG/0.5ML SOAJ, Inject 0.25 mg subcutaneously every week for weight loss, Disp: 2 mL, Rfl: 0   WEGOVY 0.5 MG/0.5ML SOAJ, Inject 0.5 mg subcutaneously every week for weight loss, Disp: 2 mL, Rfl: 0   WEGOVY 1 MG/0.5ML SOAJ, Inject 1 mg subcutaneously every week for weight loss, Disp: 2 mL, Rfl: 0  Observations/Objective: Patient is well-developed, well-nourished in no acute distress.  Resting comfortably Head is normocephalic, atraumatic.  No labored breathing.  Speech is clear and coherent with logical content.  Patient is alert and oriented at baseline.    Assessment and Plan: 1. Acute bacterial bronchitis - azithromycin (ZITHROMAX) 250 MG tablet; Take 2 tablets on day 1, then 1 tablet daily on days 2 through 5  Dispense: 6 tablet; Refill: 0 - predniSONE (DELTASONE) 20 MG tablet; Take 2 tablets (40 mg total) by mouth daily with breakfast.  Dispense: 10 tablet; Refill: 0 - promethazine-dextromethorphan (PROMETHAZINE-DM) 6.25-15 MG/5ML syrup; Take 5 mLs by mouth 4 (four) times daily as needed.  Dispense: 118 mL; Refill: 0 - benzonatate  (TESSALON) 100 MG capsule; Take 1 capsule (100 mg total) by mouth 3 (three) times daily as needed.  Dispense: 30 capsule; Refill: 0 - fluconazole (DIFLUCAN) 150 MG tablet; Take 1 tablet (150 mg total) by mouth once for 1 dose. May repeat 72 hours if needed  Dispense: 2 tablet; Refill: 0  - Worsening despite OTC medications over 1 week. - Will treat with zpak, prednisone, Promethazine DM and tessalon perles.  - Steam and humidifier can help - Diflucan given as prophylaxis as patient tends to get vaginal yeast infections with antibiotic use. - Push fluids.  - Rest.  - Seek in person evaluation if worsening or symptoms not improving    Follow Up Instructions: I discussed the assessment and treatment plan with the patient. The patient was provided an opportunity to ask questions and all were answered. The patient agreed with the plan and demonstrated an understanding of the instructions.  A copy of instructions were sent to the patient via MyChart unless otherwise noted below.    The patient was advised to call back or seek an in-person evaluation if the symptoms worsen or if the condition fails to improve as anticipated.  Time:  I spent 9 minutes with the patient via telehealth technology discussing the above problems/concerns.    Kimberly Loveless, PA-C

## 2021-08-18 NOTE — Patient Instructions (Signed)
?Kimberly Torres, thank you for joining Mar Daring, PA-C for today's virtual visit.  While this provider is not your primary care provider (PCP), if your PCP is located in our provider database this encounter information will be shared with them immediately following your visit. ? ?Consent: ?(Patient) Kimberly Torres provided verbal consent for this virtual visit at the beginning of the encounter. ? ?Current Medications: ? ?Current Outpatient Medications:  ?  azithromycin (ZITHROMAX) 250 MG tablet, Take 2 tablets on day 1, then 1 tablet daily on days 2 through 5, Disp: 6 tablet, Rfl: 0 ?  benzonatate (TESSALON) 100 MG capsule, Take 1 capsule (100 mg total) by mouth 3 (three) times daily as needed., Disp: 30 capsule, Rfl: 0 ?  fluconazole (DIFLUCAN) 150 MG tablet, Take 1 tablet (150 mg total) by mouth once for 1 dose. May repeat 72 hours if needed, Disp: 2 tablet, Rfl: 0 ?  predniSONE (DELTASONE) 20 MG tablet, Take 2 tablets (40 mg total) by mouth daily with breakfast., Disp: 10 tablet, Rfl: 0 ?  promethazine-dextromethorphan (PROMETHAZINE-DM) 6.25-15 MG/5ML syrup, Take 5 mLs by mouth 4 (four) times daily as needed., Disp: 118 mL, Rfl: 0 ?  Cholecalciferol (DIALYVITE VITAMIN D 5000 PO), Take by mouth daily., Disp: , Rfl:  ?  COVID-19 At Home Antigen Test [facility] COVID-19 HOME TEST) KIT, Use as directed, Disp: 4 each, Rfl: 0 ?  COVID-19 At Home Antigen Test [facility] COVID-19 HOME TEST) KIT, Use as directed, Disp: 4 each, Rfl: 0 ?  cyclobenzaprine (FLEXERIL) 10 MG tablet, Take 1 tablet (10 mg total) by mouth 2 (two) times daily as needed for muscle spasms., Disp: 20 tablet, Rfl: 0 ?  EPINEPHrine 0.3 mg/0.3 mL IJ SOAJ injection, Inject 0.3 mLs (0.3 mg total) into the muscle as needed for anaphylaxis., Disp: 1 each, Rfl: 1 ?  fluticasone (FLONASE) 50 MCG/ACT nasal spray, Place 2 sprays into both nostrils daily as needed for allergies. , Disp: , Rfl:  ?  folic acid (FOLVITE) 1 MG tablet,  Take 1 mg by mouth daily., Disp: , Rfl:  ?  folic acid (FOLVITE) 1 MG tablet, Take 1 tablet by mouth every day, Disp: 90 tablet, Rfl: 3 ?  melatonin 5 MG TABS, Take 5 mg by mouth at bedtime as needed., Disp: , Rfl:  ?  metoprolol tartrate (LOPRESSOR) 25 MG tablet, TAKE 1 TABLET BY MOUTH 2 TIMES DAILY AS NEEDED FOR ELEVATED BLOOD PRESSURE, HEADACHE OR HIGH PULSE RATE, Disp: 180 tablet, Rfl: 3 ?  mometasone (ELOCON) 0.1 % lotion, Apply daily to affected areas on scalp as needed, Disp: 30 mL, Rfl: 2 ?  nirmatrelvir & ritonavir (PAXLOVID, 300/100,) 20 x 150 MG & 10 x 100MG TBPK, Take 3 tablets by mouth 2 (two) times daily., Disp: 30 tablet, Rfl: 0 ?  Norethindrone-Ethinyl Estradiol-Fe Biphas (LO LOESTRIN FE) 1 MG-10 MCG / 10 MCG tablet, TAKE 1 TABLET BY MOUTH DAILY. TAKE CONTINUOUS ACTIVE TABLETS FOR 3 PACKS THEN PLACEBO TABLETS, Disp: 112 tablet, Rfl: 4 ?  Norethindrone-Ethinyl Estradiol-Fe Biphas (LO LOESTRIN FE) 1 MG-10 MCG / 10 MCG tablet, Take 1 tablet every day by oral route., Disp: 84 tablet, Rfl: 3 ?  Omega-3 Fatty Acids (FISH OIL) 1000 MG CAPS, Take 2 capsules by mouth daily., Disp: , Rfl:  ?  Probiotic Product (CULTURELLE PROBIOTICS PO), Take 1 tablet by mouth daily., Disp: , Rfl:  ?  sertraline (ZOLOFT) 100 MG tablet, TAKE 1 TABLET BY MOUTH ONCE DAILY WITH BREAKFAST, Disp: 90 tablet,  Rfl: 1 ?  tretinoin (RETIN-A) 0.025 % cream, Apply topically 3 (three) times a week. , Disp: , Rfl:  ?  WEGOVY 0.25 MG/0.5ML SOAJ, Inject 0.25 mg subcutaneously every week for weight loss, Disp: 2 mL, Rfl: 0 ?  WEGOVY 0.5 MG/0.5ML SOAJ, Inject 0.5 mg subcutaneously every week for weight loss, Disp: 2 mL, Rfl: 0 ?  WEGOVY 1 MG/0.5ML SOAJ, Inject 1 mg subcutaneously every week for weight loss, Disp: 2 mL, Rfl: 0  ? ?Medications ordered in this encounter:  ?Meds ordered this encounter  ?Medications  ? azithromycin (ZITHROMAX) 250 MG tablet  ?  Sig: Take 2 tablets on day 1, then 1 tablet daily on days 2 through 5  ?  Dispense:  6  tablet  ?  Refill:  0  ?  Order Specific Question:   Supervising Provider  ?  Answer:   Noemi Chapel [[id number]]  ? predniSONE (DELTASONE) 20 MG tablet  ?  Sig: Take 2 tablets (40 mg total) by mouth daily with breakfast.  ?  Dispense:  10 tablet  ?  Refill:  0  ?  Order Specific Question:   Supervising Provider  ?  Answer:   Noemi Chapel [[id number]]  ? promethazine-dextromethorphan (PROMETHAZINE-DM) 6.25-15 MG/5ML syrup  ?  Sig: Take 5 mLs by mouth 4 (four) times daily as needed.  ?  Dispense:  118 mL  ?  Refill:  0  ?  Order Specific Question:   Supervising Provider  ?  Answer:   Noemi Chapel [[id number]]  ? benzonatate (TESSALON) 100 MG capsule  ?  Sig: Take 1 capsule (100 mg total) by mouth 3 (three) times daily as needed.  ?  Dispense:  30 capsule  ?  Refill:  0  ?  Order Specific Question:   Supervising Provider  ?  Answer:   Noemi Chapel [[id number]]  ? fluconazole (DIFLUCAN) 150 MG tablet  ?  Sig: Take 1 tablet (150 mg total) by mouth once for 1 dose. May repeat 72 hours if needed  ?  Dispense:  2 tablet  ?  Refill:  0  ?  Order Specific Question:   Supervising Provider  ?  Answer:   Noemi Chapel [[id number]]  ?  ? ?*If you need refills on other medications prior to your next appointment, please contact your pharmacy* ? ?Follow-Up: ?Call back or seek an in-person evaluation if the symptoms worsen or if the condition fails to improve as anticipated. ? ?Other Instructions ?Acute Bronchitis, Adult ?Acute bronchitis is sudden inflammation of the main airways (bronchi) that come off the windpipe (trachea) in the lungs. The swelling causes the airways to get smaller and make more mucus than normal. This can make it hard to breathe and can cause coughing or noisy breathing (wheezing). ?Acute bronchitis may last several weeks. The cough may last longer. Allergies, asthma, and exposure to smoke may make the condition worse. ?What are the causes? ?This condition can be caused by germs and by substances that irritate the lungs,  including: ?Cold and flu viruses. The most common cause of this condition is the virus that causes the common cold. ?Bacteria. This is less common. ?Breathing in substances that irritate the lungs, including: ?Smoke from cigarettes and other forms of tobacco. ?Dust and pollen. ?Fumes from household cleaning products, gases, or burned fuel. ?Indoor or outdoor air pollution. ?What increases the risk? ?The following factors may make you more likely to develop this condition: ?A weak body's defense system, also called the immune  system. ?A condition that affects your lungs and breathing, such as asthma. ?What are the signs or symptoms? ?Common symptoms of this condition include: ?Coughing. This may bring up clear, yellow, or green mucus from your lungs (sputum). ?Wheezing. ?Runny or stuffy nose. ?Having too much mucus in your lungs (chest congestion). ?Shortness of breath. ?Aches and pains, including sore throat or chest. ?How is this diagnosed? ?This condition is usually diagnosed based on: ?Your symptoms and medical history. ?A physical exam. ?You may also have other tests, including tests to rule out other conditions, such as pneumonia. These tests include: ?A test of lung function. ?Test of a mucus sample to look for the presence of bacteria. ?Tests to check the oxygen level in your blood. ?Blood tests. ?Chest X-ray. ?How is this treated? ?Most cases of acute bronchitis clear up over time without treatment. Your health care provider may recommend: ?Drinking more fluids to help thin your mucus so it is easier to cough up. ?Taking inhaled medicine (inhaler) to improve air flow in and out of your lungs. ?Using a vaporizer or a humidifier. These are machines that add water to the air to help you breathe better. ?Taking a medicine that thins mucus and clears congestion (expectorant). ?Taking a medicine that prevents or stops coughing (cough suppressant). ?It is notcommon to take an antibiotic medicine for this  condition. ?Follow these instructions at home: ? ?Take over-the-counter and prescription medicines only as told by your health care provider. ?Use an inhaler, vaporizer, or humidifier as told by your health care provider. ?Take

## 2021-08-24 ENCOUNTER — Ambulatory Visit (INDEPENDENT_AMBULATORY_CARE_PROVIDER_SITE_OTHER): Payer: Self-pay | Admitting: Dermatology

## 2021-08-24 ENCOUNTER — Other Ambulatory Visit: Payer: Self-pay

## 2021-08-24 DIAGNOSIS — L988 Other specified disorders of the skin and subcutaneous tissue: Secondary | ICD-10-CM

## 2021-08-24 NOTE — Progress Notes (Signed)
? ?  Follow-Up Visit ?  ?Subjective  ?Kimberly Torres is a 41 y.o. female who presents for the following: Facial Elastosis (Face, pt presents for Botox ). ? ?The following portions of the chart were reviewed this encounter and updated as appropriate:  ? Tobacco  Allergies  Meds  Problems  Med Hx  Surg Hx  Fam Hx   ?  ?Review of Systems:  No other skin or systemic complaints except as noted in HPI or Assessment and Plan. ? ?Objective  ?Well appearing patient in no apparent distress; mood and affect are within normal limits. ? ?A focused examination was performed including face. Relevant physical exam findings are noted in the Assessment and Plan. ? ?face ?Rhytides and volume loss.  ? ? ? ? ? ?Assessment & Plan  ?Elastosis of skin ?face ? ?Discussed increasing the forehead from 5 units to 7.5 units ? ? ?Botox 37.5 units injected as marked: ?- Frown complex 20 units  ?- Crow's feet 10 units (5 each side) ?- forehead 7.5 units ? ?Botox Injection - face ?Location: Frown complex, forehead, bil crow's feet ? ?Informed consent: Discussed risks (infection, pain, bleeding, bruising, swelling, allergic reaction, paralysis of nearby muscles, eyelid droop, double vision, neck weakness, difficulty breathing, headache, undesirable cosmetic result, and need for additional treatment) and benefits of the procedure, as well as the alternatives.  Informed consent was obtained. ? ?Preparation: The area was cleansed with alcohol. ? ?Procedure Details:  Botox was injected into the dermis with a 30-gauge needle. Pressure applied to any bleeding. Ice packs offered for swelling. ? ?Lot Number: [id number] ?Expiration:  08/2023 ? ?Total Units Injected:  37.5 ? ?Plan: Patient was instructed to remain upright for 4 hours. Patient was instructed to avoid massaging the face and avoid vigorous exercise for the rest of the day. Tylenol may be used for headache.  Allow 2 weeks before returning to clinic for additional dosing as needed. Patient  will call for any problems. ? ? ? ?Return for 3-66mBotox. ? ?I, SOthelia Pulling RMA, am acting as scribe for DSarina Ser MD . ?Documentation: I have reviewed the above documentation for accuracy and completeness, and I agree with the above. ? ?DSarina Ser MD ? ? ?

## 2021-08-24 NOTE — Patient Instructions (Signed)

## 2021-08-27 ENCOUNTER — Encounter: Payer: Self-pay | Admitting: Dermatology

## 2021-08-30 ENCOUNTER — Other Ambulatory Visit: Payer: Self-pay

## 2021-08-31 ENCOUNTER — Ambulatory Visit: Payer: Self-pay | Admitting: Dermatology

## 2021-09-01 ENCOUNTER — Other Ambulatory Visit: Payer: Self-pay

## 2021-09-01 MED ORDER — CARESTART COVID-19 HOME TEST VI KIT
PACK | 0 refills | Status: DC
  Filled 2021-09-01: qty 2, 4d supply, fill #0

## 2021-09-04 ENCOUNTER — Other Ambulatory Visit: Payer: Self-pay

## 2021-09-04 MED ORDER — WEGOVY 1 MG/0.5ML ~~LOC~~ SOAJ
SUBCUTANEOUS | 0 refills | Status: DC
  Filled 2021-09-04: qty 2, 28d supply, fill #0

## 2021-09-05 ENCOUNTER — Other Ambulatory Visit: Payer: Self-pay

## 2021-09-22 ENCOUNTER — Other Ambulatory Visit: Payer: Self-pay

## 2021-09-22 MED ORDER — WEGOVY 1.7 MG/0.75ML ~~LOC~~ SOAJ
SUBCUTANEOUS | 0 refills | Status: DC
  Filled 2021-09-22 – 2021-09-27 (×4): qty 3, 28d supply, fill #0

## 2021-09-26 ENCOUNTER — Other Ambulatory Visit: Payer: Self-pay

## 2021-09-27 ENCOUNTER — Other Ambulatory Visit: Payer: Self-pay

## 2021-10-11 ENCOUNTER — Other Ambulatory Visit: Payer: Self-pay

## 2021-10-11 MED ORDER — COVID-19 AT HOME ANTIGEN TEST VI KIT
PACK | 0 refills | Status: DC
  Filled 2021-10-11: qty 2, 4d supply, fill #0

## 2021-10-15 ENCOUNTER — Other Ambulatory Visit: Payer: Self-pay

## 2021-10-15 MED ORDER — WEGOVY 1.7 MG/0.75ML ~~LOC~~ SOAJ
SUBCUTANEOUS | 0 refills | Status: DC
  Filled 2021-10-15 – 2021-10-17 (×2): qty 3, 28d supply, fill #0

## 2021-10-16 ENCOUNTER — Other Ambulatory Visit: Payer: Self-pay

## 2021-10-17 ENCOUNTER — Other Ambulatory Visit: Payer: Self-pay

## 2021-10-19 ENCOUNTER — Other Ambulatory Visit: Payer: Self-pay

## 2021-10-25 ENCOUNTER — Encounter: Payer: Self-pay | Admitting: Family Medicine

## 2021-10-25 DIAGNOSIS — I83811 Varicose veins of right lower extremities with pain: Secondary | ICD-10-CM

## 2021-10-26 DIAGNOSIS — I83813 Varicose veins of bilateral lower extremities with pain: Secondary | ICD-10-CM | POA: Insufficient documentation

## 2021-10-26 DIAGNOSIS — I8390 Asymptomatic varicose veins of unspecified lower extremity: Secondary | ICD-10-CM | POA: Insufficient documentation

## 2021-11-02 ENCOUNTER — Other Ambulatory Visit: Payer: Self-pay

## 2021-11-02 MED ORDER — WEGOVY 2.4 MG/0.75ML ~~LOC~~ SOAJ
SUBCUTANEOUS | 0 refills | Status: DC
  Filled 2021-11-02: qty 9, 84d supply, fill #0

## 2021-11-07 ENCOUNTER — Other Ambulatory Visit: Payer: Self-pay

## 2021-12-07 ENCOUNTER — Other Ambulatory Visit: Payer: Self-pay

## 2021-12-08 ENCOUNTER — Other Ambulatory Visit: Payer: Self-pay

## 2021-12-08 MED ORDER — TRETINOIN 0.05 % EX CREA
TOPICAL_CREAM | CUTANEOUS | 2 refills | Status: DC
  Filled 2021-12-08: qty 45, 20d supply, fill #0
  Filled 2022-03-02: qty 45, 20d supply, fill #1

## 2021-12-11 ENCOUNTER — Other Ambulatory Visit: Payer: Self-pay

## 2021-12-14 ENCOUNTER — Ambulatory Visit (INDEPENDENT_AMBULATORY_CARE_PROVIDER_SITE_OTHER): Payer: Self-pay | Admitting: Dermatology

## 2021-12-14 DIAGNOSIS — L988 Other specified disorders of the skin and subcutaneous tissue: Secondary | ICD-10-CM

## 2021-12-14 NOTE — Progress Notes (Signed)
   Follow-Up Visit   Subjective  Kimberly Torres is a 41 y.o. female who presents for the following: Facial Elastosis (4 months f/u on Botox ).  The following portions of the chart were reviewed this encounter and updated as appropriate:   Tobacco  Allergies  Meds  Problems  Med Hx  Surg Hx  Fam Hx     Review of Systems:  No other skin or systemic complaints except as noted in HPI or Assessment and Plan.  Objective  Well appearing patient in no apparent distress; mood and affect are within normal limits.  A focused examination was performed including face. Relevant physical exam findings are noted in the Assessment and Plan.  face Rhytides and volume loss.       Assessment & Plan  Elastosis of skin face  Botox 37.5 units injected as marked: - Frown complex 20 units  - Crow's feet 10 units (5 each side) - forehead 7.5 units  Intralesional injection - face Location: See attached image  Informed consent: Discussed risks (infection, pain, bleeding, bruising, swelling, allergic reaction, paralysis of nearby muscles, eyelid droop, double vision, neck weakness, difficulty breathing, headache, undesirable cosmetic result, and need for additional treatment) and benefits of the procedure, as well as the alternatives.  Informed consent was obtained.  Preparation: The area was cleansed with alcohol.  Procedure Details:  Botox was injected into the dermis with a 30-gauge needle. Pressure applied to any bleeding. Ice packs offered for swelling.  Lot Number:  [id number] Expiration:  10/03/2023  Total Units Injected:  37.5  Plan: Patient was instructed to remain upright for 4 hours. Patient was instructed to avoid massaging the face and avoid vigorous exercise for the rest of the day. Tylenol may be used for headache.  Allow 2 weeks before returning to clinic for additional dosing as needed. Patient will call for any problems.    Return in about 4 months (around 04/16/2022) for  Botox.  IMarye Round, CMA, am acting as scribe for Sarina Ser, MD .  Documentation: I have reviewed the above documentation for accuracy and completeness, and I agree with the above.  Sarina Ser, MD

## 2021-12-14 NOTE — Patient Instructions (Signed)
Due to recent changes in healthcare laws, you may see results of your pathology and/or laboratory studies on MyChart before the doctors have had a chance to review them. We understand that in some cases there may be results that are confusing or concerning to you. Please understand that not all results are received at the same time and often the doctors may need to interpret multiple results in order to provide you with the best plan of care or course of treatment. Therefore, we ask that you please give us 2 business days to thoroughly review all your results before contacting the office for clarification. Should we see a critical lab result, you will be contacted sooner.   If You Need Anything After Your Visit  If you have any questions or concerns for your doctor, please call our main line at [phone] and press option 4 to reach your doctor's medical assistant. If no one answers, please leave a voicemail as directed and we will return your call as soon as possible. Messages left after 4 pm will be answered the following business day.   You may also send us a message via MyChart. We typically respond to MyChart messages within 1-2 business days.  For prescription refills, please ask your pharmacy to contact our office. Our fax number is [phone].  If you have an urgent issue when the clinic is closed that cannot wait until the next business day, you can page your doctor at the number below.    Please note that while we do our best to be available for urgent issues outside of office hours, we are not available 24/7.   If you have an urgent issue and are unable to reach us, you may choose to seek medical care at your doctor's office, retail clinic, urgent care center, or emergency room.  If you have a medical emergency, please immediately call 911 or go to the emergency department.  Pager Numbers  - Dr. Kowalski: [phone]  - Dr. Moye: [phone]  - Dr. Stewart:  [phone]  In the event of inclement weather, please call our main line at [phone] for an update on the status of any delays or closures.  Dermatology Medication Tips: Please keep the boxes that topical medications come in in order to help keep track of the instructions about where and how to use these. Pharmacies typically print the medication instructions only on the boxes and not directly on the medication tubes.   If your medication is too expensive, please contact our office at [phone] option 4 or send us a message through MyChart.   We are unable to tell what your co-pay for medications will be in advance as this is different depending on your insurance coverage. However, we may be able to find a substitute medication at lower cost or fill out paperwork to get insurance to cover a needed medication.   If a prior authorization is required to get your medication covered by your insurance company, please allow us 1-2 business days to complete this process.  Drug prices often vary depending on where the prescription is filled and some pharmacies may offer cheaper prices.  The website www.goodrx.com contains coupons for medications through different pharmacies. The prices here do not account for what the cost may be with help from insurance (it may be cheaper with your insurance), but the website can give you the price if you did not use any insurance.  - You can print the associated coupon and take it with   your prescription to the pharmacy.  - You may also stop by our office during regular business hours and pick up a GoodRx coupon card.  - If you need your prescription sent electronically to a different pharmacy, notify our office through [employer] MyChart or by phone at [phone] option 4.     Si Usted Necesita Algo Despus de Su Visita  Tambin puede enviarnos un mensaje a travs de MyChart. Por lo general respondemos a los mensajes de MyChart en el transcurso de 1 a 2  das hbiles.  Para renovar recetas, por favor pida a su farmacia que se ponga en contacto con nuestra oficina. Nuestro nmero de fax es el [phone].  Si tiene un asunto urgente cuando la clnica est cerrada y que no puede esperar hasta el siguiente da hbil, puede llamar/localizar a su doctor(a) al nmero que aparece a continuacin.   Por favor, tenga en cuenta que aunque hacemos todo lo posible para estar disponibles para asuntos urgentes fuera del horario de oficina, no estamos disponibles las 24 horas del da, los 7 das de la semana.   Si tiene un problema urgente y no puede comunicarse con nosotros, puede optar por buscar atencin mdica  en el consultorio de su doctor(a), en una clnica privada, en un centro de atencin urgente o en una sala de emergencias.  Si tiene una emergencia mdica, por favor llame inmediatamente al 911 o vaya a la sala de emergencias.  Nmeros de bper  - Dr. Kowalski: [phone]  - Dra. Moye: [phone]  - Dra. Stewart: [phone]  En caso de inclemencias del tiempo, por favor llame a nuestra lnea principal al [phone] para una actualizacin sobre el estado de cualquier retraso o cierre.  Consejos para la medicacin en dermatologa: Por favor, guarde las cajas en las que vienen los medicamentos de uso tpico para ayudarle a seguir las instrucciones sobre dnde y cmo usarlos. Las farmacias generalmente imprimen las instrucciones del medicamento slo en las cajas y no directamente en los tubos del medicamento.   Si su medicamento es muy caro, por favor, pngase en contacto con nuestra oficina llamando al [phone] y presione la opcin 4 o envenos un mensaje a travs de MyChart.   No podemos decirle cul ser su copago por los medicamentos por adelantado ya que esto es diferente dependiendo de la cobertura de su seguro. Sin embargo, es posible que podamos encontrar un medicamento sustituto a menor costo o llenar un formulario para que el  seguro cubra el medicamento que se considera necesario.   Si se requiere una autorizacin previa para que su compaa de seguros cubra su medicamento, por favor permtanos de 1 a 2 das hbiles para completar este proceso.  Los precios de los medicamentos varan con frecuencia dependiendo del lugar de dnde se surte la receta y alguna farmacias pueden ofrecer precios ms baratos.  El sitio web www.goodrx.com tiene cupones para medicamentos de diferentes farmacias. Los precios aqu no tienen en cuenta lo que podra costar con la ayuda del seguro (puede ser ms barato con su seguro), pero el sitio web puede darle el precio si no utiliz ningn seguro.  - Puede imprimir el cupn correspondiente y llevarlo con su receta a la farmacia.  - Tambin puede pasar por nuestra oficina durante el horario de atencin regular y recoger una tarjeta de cupones de GoodRx.  - Si necesita que su receta se enve electrnicamente a una farmacia diferente, informe a nuestra oficina a travs de MyChart de [place] [facility]   o por telfono llamando al [phone] y presione la opcin 4.  

## 2021-12-22 ENCOUNTER — Other Ambulatory Visit (INDEPENDENT_AMBULATORY_CARE_PROVIDER_SITE_OTHER): Payer: Self-pay | Admitting: Nurse Practitioner

## 2021-12-22 DIAGNOSIS — I83812 Varicose veins of left lower extremities with pain: Secondary | ICD-10-CM

## 2021-12-23 ENCOUNTER — Encounter: Payer: Self-pay | Admitting: Dermatology

## 2021-12-26 ENCOUNTER — Ambulatory Visit (INDEPENDENT_AMBULATORY_CARE_PROVIDER_SITE_OTHER): Payer: 59

## 2021-12-26 ENCOUNTER — Ambulatory Visit (INDEPENDENT_AMBULATORY_CARE_PROVIDER_SITE_OTHER): Payer: 59 | Admitting: Vascular Surgery

## 2021-12-26 ENCOUNTER — Encounter (INDEPENDENT_AMBULATORY_CARE_PROVIDER_SITE_OTHER): Payer: Self-pay | Admitting: Vascular Surgery

## 2021-12-26 ENCOUNTER — Encounter (INDEPENDENT_AMBULATORY_CARE_PROVIDER_SITE_OTHER): Payer: 59

## 2021-12-26 ENCOUNTER — Encounter (INDEPENDENT_AMBULATORY_CARE_PROVIDER_SITE_OTHER): Payer: 59 | Admitting: Vascular Surgery

## 2021-12-26 VITALS — BP 125/80 | HR 85 | Resp 16 | Wt 187.6 lb

## 2021-12-26 DIAGNOSIS — I83812 Varicose veins of left lower extremities with pain: Secondary | ICD-10-CM | POA: Diagnosis not present

## 2021-12-26 DIAGNOSIS — I83813 Varicose veins of bilateral lower extremities with pain: Secondary | ICD-10-CM

## 2021-12-26 NOTE — Progress Notes (Signed)
Patient ID: Kimberly Torres, female   DOB: 07-Nov-1980, 41 y.o.   MRN: [id number]  Chief Complaint  Patient presents with   New Patient (Initial Visit)    Ref Tower consult varicose veins    HPI Kimberly Torres is a 41 y.o. female.  I am asked to see the patient by Dr. Glori Bickers for evaluation of painful varicose veins of the lower extremity.  About 10 or 15 years ago the patient underwent laser ablation of the left great saphenous vein at a vein clinic.  For many years, she did well.  Over the past couple of years she has noticed increasing pain and heaviness particularly in the left lower extremity.  This has been associated with increasing varicose veins.  No previous history of DVT or superficial thrombophlebitis to her knowledge.  She denies any chest pain or shortness of breath.  No ulceration.  Her legs are heavy and achy.  This particular prominent in the evening when she has been up on her feet and she has a job where she has to stand much of the day.  A left leg venous reflux study was done today which demonstrated successful ablation of the left great saphenous vein throughout much of the left lower extremity.  No DVT or superficial thrombophlebitis was seen.  No significant venous reflux was identified.   Past Medical History:  Diagnosis Date   Acne    sees derm- on accutane   Anxiety    Chondromalacia of right knee    right   Depression    Dysplastic nevus 05/16/2009   R deltoid - moderate   Dysplastic nevus 09/06/2009   R med knee - moderate   Dysrhythmia    hx SVT   H/O seasonal allergies    Headache(784.0)    cluster migraines   Insomnia    Sinus tachycardia    eval by Dr. Stanford Breed in 2007: monior revealed sinus tach. vs EAT; Echo 10/07: EF 70%   Varicose veins    eval. by vascular surgeon and procedure scheduled to correct    Past Surgical History:  Procedure Laterality Date   CESAREAN SECTION  2009   DIAGNOSTIC LAPAROSCOPY  2010   scar  tissue   KNEE SURGERY  04/10/2010   LUMBAR LAMINECTOMY/DECOMPRESSION MICRODISCECTOMY Right 09/28/2013   Procedure: LUMBAR LAMINECTOMY/DECOMPRESSION MICRODISCECTOMY 1 LEVEL,LUMBAR  THREE-FOUR RIGHT ;  Surgeon: Charlie Pitter, MD;  Location: [facility];  Service: Neurosurgery;  Laterality: Right;  right   MYRINGOTOMY       Family History  Problem Relation Age of Onset   Hypertension Mother    ADD / ADHD Mother    Coronary artery disease Other        grandfather   Heart attack Other        grandfather   Breast cancer Other        grandmother   Bipolar disorder Other        grandmother   ADD / ADHD Sister    COPD Maternal Grandfather    Breast cancer Maternal Grandmother 70      Social History   Tobacco Use   Smoking status: Never   Smokeless tobacco: Never  Vaping Use   Vaping Use: Never used  Substance Use Topics   Alcohol use: Yes    Alcohol/week: 0.0 standard drinks of alcohol    Comment: occasional to rare   Drug use: No     Allergies  Allergen Reactions  Covid-19 Mrna Vacc (Moderna) Anaphylaxis    WAS GIVEN THE PFIZER NOT MODERNA   Triamcinolone Acetonide Anaphylaxis   Covid-19 (Mrna) Vaccine     Current Outpatient Medications  Medication Sig Dispense Refill   Cholecalciferol (DIALYVITE VITAMIN D 5000 PO) Take by mouth daily.     EPINEPHrine 0.3 mg/0.3 mL IJ SOAJ injection Inject 0.3 mLs (0.3 mg total) into the muscle as needed for anaphylaxis. 1 each 1   fluticasone (FLONASE) 50 MCG/ACT nasal spray Place 2 sprays into both nostrils daily as needed for allergies.      folic acid (FOLVITE) 1 MG tablet Take 1 tablet by mouth every day 90 tablet 3   melatonin 5 MG TABS Take 5 mg by mouth at bedtime as needed.     mometasone (ELOCON) 0.1 % lotion Apply daily to affected areas on scalp as needed 30 mL 2   Norethindrone-Ethinyl Estradiol-Fe Biphas (LO LOESTRIN FE) 1 MG-10 MCG / 10 MCG tablet Take 1 tablet every day by oral route. 84 tablet 3   Omega-3 Fatty Acids  (FISH OIL) 1000 MG CAPS Take 2 capsules by mouth daily.     tretinoin (RETIN-A) 0.05 % cream Apply a pea-sized amount to dry face as bedtime 45 g 2   WEGOVY 2.4 MG/0.75ML SOAJ Inject 2.4 mg subcutaneously every week for weight loss 9 mL 0   benzonatate (TESSALON) 100 MG capsule Take 1 capsule (100 mg total) by mouth 3 (three) times daily as needed. 30 capsule 0   COVID-19 At Home Antigen Test (CARESTART COVID-19 HOME TEST) KIT Use as directed 4 each 0   COVID-19 At Home Antigen Test (CARESTART COVID-19 HOME TEST) KIT Use as directed 4 each 0   COVID-19 At Home Antigen Test (CARESTART COVID-19 HOME TEST) KIT Use as directed 2 kit 0   cyclobenzaprine (FLEXERIL) 10 MG tablet Take 1 tablet (10 mg total) by mouth 2 (two) times daily as needed for muscle spasms. 20 tablet 0   folic acid (FOLVITE) 1 MG tablet Take 1 mg by mouth daily.     metoprolol tartrate (LOPRESSOR) 25 MG tablet TAKE 1 TABLET BY MOUTH 2 TIMES DAILY AS NEEDED FOR ELEVATED BLOOD PRESSURE, HEADACHE OR HIGH PULSE RATE 180 tablet 3   nirmatrelvir & ritonavir (PAXLOVID, 300/100,) 20 x 150 MG & 10 x 100MG TBPK Take 3 tablets by mouth 2 (two) times daily. 30 tablet 0   Norethindrone-Ethinyl Estradiol-Fe Biphas (LO LOESTRIN FE) 1 MG-10 MCG / 10 MCG tablet TAKE 1 TABLET BY MOUTH DAILY. TAKE CONTINUOUS ACTIVE TABLETS FOR 3 PACKS THEN PLACEBO TABLETS 112 tablet 4   predniSONE (DELTASONE) 20 MG tablet Take 2 tablets (40 mg total) by mouth daily with breakfast. 10 tablet 0   Probiotic Product (CULTURELLE PROBIOTICS PO) Take 1 tablet by mouth daily.     promethazine-dextromethorphan (PROMETHAZINE-DM) 6.25-15 MG/5ML syrup Take 5 mLs by mouth 4 (four) times daily as needed. 118 mL 0   sertraline (ZOLOFT) 100 MG tablet TAKE 1 TABLET BY MOUTH ONCE DAILY WITH BREAKFAST 90 tablet 1   tretinoin (RETIN-A) 0.025 % cream Apply topically 3 (three) times a week.      WEGOVY 0.25 MG/0.5ML SOAJ Inject 0.25 mg subcutaneously every week for weight loss 2 mL 0    WEGOVY 0.5 MG/0.5ML SOAJ Inject 0.5 mg subcutaneously every week for weight loss 2 mL 0   WEGOVY 1 MG/0.5ML SOAJ Inject 1 mg subcutaneously every week for weight loss 2 mL 0   WEGOVY 1.7 MG/0.75ML SOAJ  Inject 1.7 mg subcutaneously every week for weight loss 3 mL 0   No current facility-administered medications for this visit.      REVIEW OF SYSTEMS (Negative unless checked)  Constitutional: [] Weight loss  [] Fever  [] Chills Cardiac: [] Chest pain   [] Chest pressure   [x] Palpitations   [] Shortness of breath when laying flat   [] Shortness of breath at rest   [] Shortness of breath with exertion. Vascular:  [x] Pain in legs with walking   [] Pain in legs at rest   [] Pain in legs when laying flat   [] Claudication   [] Pain in feet when walking  [] Pain in feet at rest  [] Pain in feet when laying flat   [] History of DVT   [] Phlebitis   [] Swelling in legs   [x] Varicose veins   [] Non-healing ulcers Pulmonary:   [] Uses home oxygen   [] Productive cough   [] Hemoptysis   [] Wheeze  [] COPD   [] Asthma Neurologic:  [] Dizziness  [] Blackouts   [] Seizures   [] History of stroke   [] History of TIA  [] Aphasia   [] Temporary blindness   [] Dysphagia   [] Weakness or numbness in arms   [] Weakness or numbness in legs Musculoskeletal:  [] Arthritis   [] Joint swelling   [] Joint pain   [] Low back pain Hematologic:  [] Easy bruising  [] Easy bleeding   [] Hypercoagulable state   [] Anemic  [] Hepatitis Gastrointestinal:  [] Blood in stool   [] Vomiting blood  [] Gastroesophageal reflux/heartburn   [] Abdominal pain Genitourinary:  [] Chronic kidney disease   [] Difficult urination  [] Frequent urination  [] Burning with urination   [] Hematuria Skin:  [] Rashes   [] Ulcers   [] Wounds Psychological:  [x] History of anxiety   []  History of major depression.    Physical Exam BP 125/80 (BP Location: Left Arm)   Pulse 85   Resp 16   Wt 187 lb 9.6 oz (85.1 kg)   BMI 29.38 kg/m  Gen:  WD/WN, NAD Head: [place]/AT, No temporalis wasting.   Ear/Nose/Throat: Hearing grossly intact, nares w/o erythema or drainage, oropharynx w/o Erythema/Exudate Eyes: Conjunctiva clear, sclera non-icteric  Neck: trachea midline.  No JVD.  Pulmonary:  Good air movement, respirations not labored, no use of accessory muscles  Cardiac: RRR, no JVD Vascular:  Vessel Right Left  Radial Palpable Palpable                                   Gastrointestinal:. No masses, surgical incisions, or scars. Musculoskeletal: M/S 5/5 throughout.  Extremities without ischemic changes.  No deformity or atrophy.  Diffuse varicosities left greater than right.  No significant lower extremity edema. Neurologic: Sensation grossly intact in extremities.  Symmetrical.  Speech is fluent. Motor exam as listed above. Psychiatric: Judgment intact, Mood & affect appropriate for pt's clinical situation. Dermatologic: No rashes or ulcers noted.  No cellulitis or open wounds.    Radiology No results found.  Labs No results found for this or any previous visit (from the past 2160 hour(s)).  Assessment/Plan:  Varicose veins of leg with pain, bilateral A left leg venous reflux study was done today which demonstrated successful ablation of the left great saphenous vein throughout much of the left lower extremity.  No DVT or superficial thrombophlebitis was seen.  No significant venous reflux was identified. Given this finding, laser ablation would not be helpful at this point.  I do think it would be reasonable to consider sclerotherapy for her residual varicosities that are painful.  I discussed  the risks and benefits of sclerotherapy.  We will submit this for approval.      Leotis Pain 12/26/2021, 9:16 AM   This note was created with Dragon medical transcription system.  Any errors from dictation are unintentional.

## 2021-12-26 NOTE — Assessment & Plan Note (Signed)
A left leg venous reflux study was done today which demonstrated successful ablation of the left great saphenous vein throughout much of the left lower extremity.  No DVT or superficial thrombophlebitis was seen.  No significant venous reflux was identified. Given this finding, laser ablation would not be helpful at this point.  I do think it would be reasonable to consider sclerotherapy for her residual varicosities that are painful.  I discussed the risks and benefits of sclerotherapy.  We will submit this for approval.

## 2022-01-02 ENCOUNTER — Other Ambulatory Visit: Payer: Self-pay

## 2022-01-03 ENCOUNTER — Other Ambulatory Visit: Payer: Self-pay

## 2022-01-03 MED ORDER — WEGOVY 2.4 MG/0.75ML ~~LOC~~ SOAJ
SUBCUTANEOUS | 0 refills | Status: DC
  Filled 2022-01-03: qty 9, 84d supply, fill #0

## 2022-01-08 ENCOUNTER — Other Ambulatory Visit: Payer: Self-pay

## 2022-01-08 DIAGNOSIS — Z01419 Encounter for gynecological examination (general) (routine) without abnormal findings: Secondary | ICD-10-CM | POA: Diagnosis not present

## 2022-01-08 DIAGNOSIS — Z6829 Body mass index (BMI) 29.0-29.9, adult: Secondary | ICD-10-CM | POA: Diagnosis not present

## 2022-01-08 MED ORDER — FOLIC ACID 1 MG PO TABS
ORAL_TABLET | ORAL | 3 refills | Status: DC
  Filled 2022-01-08: qty 90, 90d supply, fill #0
  Filled 2022-03-21: qty 90, 90d supply, fill #1
  Filled 2022-06-19: qty 90, 90d supply, fill #2
  Filled 2022-10-30: qty 90, 90d supply, fill #3

## 2022-01-08 MED ORDER — LO LOESTRIN FE 1 MG-10 MCG / 10 MCG PO TABS
ORAL_TABLET | ORAL | 3 refills | Status: DC
  Filled 2022-01-08: qty 112, 84d supply, fill #0
  Filled 2022-03-19: qty 112, 84d supply, fill #1
  Filled 2022-06-19: qty 112, 84d supply, fill #2
  Filled 2022-11-07: qty 112, 84d supply, fill #3

## 2022-01-09 ENCOUNTER — Other Ambulatory Visit: Payer: Self-pay

## 2022-01-25 ENCOUNTER — Encounter (INDEPENDENT_AMBULATORY_CARE_PROVIDER_SITE_OTHER): Payer: Self-pay | Admitting: Nurse Practitioner

## 2022-01-25 ENCOUNTER — Ambulatory Visit (INDEPENDENT_AMBULATORY_CARE_PROVIDER_SITE_OTHER): Payer: 59 | Admitting: Nurse Practitioner

## 2022-01-25 VITALS — BP 121/78 | HR 85 | Resp 16 | Wt 187.0 lb

## 2022-01-25 DIAGNOSIS — I83813 Varicose veins of bilateral lower extremities with pain: Secondary | ICD-10-CM

## 2022-02-12 ENCOUNTER — Encounter (INDEPENDENT_AMBULATORY_CARE_PROVIDER_SITE_OTHER): Payer: Self-pay | Admitting: Nurse Practitioner

## 2022-02-12 NOTE — Progress Notes (Signed)
Varicose veins of left lower extremity with inflammation (454.1  I83.10) Current Plans   Indication: Patient presents with symptomatic varicose veins of the left lower extremity.   Procedure: Sclerotherapy using hypertonic saline mixed with 1% Lidocaine was performed on the left lower extremity. Compression wraps were placed. The patient tolerated the procedure well. 

## 2022-02-22 ENCOUNTER — Ambulatory Visit (INDEPENDENT_AMBULATORY_CARE_PROVIDER_SITE_OTHER): Payer: 59 | Admitting: Nurse Practitioner

## 2022-02-22 ENCOUNTER — Encounter (INDEPENDENT_AMBULATORY_CARE_PROVIDER_SITE_OTHER): Payer: Self-pay | Admitting: Nurse Practitioner

## 2022-02-22 VITALS — BP 117/71 | HR 94 | Resp 16 | Wt 185.4 lb

## 2022-02-22 DIAGNOSIS — I83812 Varicose veins of left lower extremities with pain: Secondary | ICD-10-CM | POA: Diagnosis not present

## 2022-02-22 NOTE — Progress Notes (Signed)
Varicose veins of left lower extremity with inflammation (454.1  I83.10) Current Plans   Indication: Patient presents with symptomatic varicose veins of the left lower extremity.   Procedure: Sclerotherapy using hypertonic saline mixed with 1% Lidocaine was performed on the left lower extremity. Compression wraps were placed. The patient tolerated the procedure well. 

## 2022-03-02 ENCOUNTER — Other Ambulatory Visit: Payer: Self-pay

## 2022-03-05 ENCOUNTER — Other Ambulatory Visit: Payer: Self-pay

## 2022-03-06 ENCOUNTER — Other Ambulatory Visit: Payer: Self-pay

## 2022-03-19 ENCOUNTER — Other Ambulatory Visit: Payer: Self-pay

## 2022-03-20 ENCOUNTER — Other Ambulatory Visit: Payer: Self-pay

## 2022-03-20 MED ORDER — WEGOVY 2.4 MG/0.75ML ~~LOC~~ SOAJ
SUBCUTANEOUS | 0 refills | Status: DC
  Filled 2022-03-20 – 2022-03-27 (×3): qty 9, 84d supply, fill #0

## 2022-03-21 ENCOUNTER — Other Ambulatory Visit: Payer: Self-pay

## 2022-03-22 ENCOUNTER — Ambulatory Visit (INDEPENDENT_AMBULATORY_CARE_PROVIDER_SITE_OTHER): Payer: 59 | Admitting: Nurse Practitioner

## 2022-03-27 ENCOUNTER — Ambulatory Visit (INDEPENDENT_AMBULATORY_CARE_PROVIDER_SITE_OTHER): Payer: Self-pay | Admitting: Dermatology

## 2022-03-27 ENCOUNTER — Other Ambulatory Visit: Payer: Self-pay

## 2022-03-27 DIAGNOSIS — L988 Other specified disorders of the skin and subcutaneous tissue: Secondary | ICD-10-CM

## 2022-03-27 NOTE — Patient Instructions (Signed)
Due to recent changes in healthcare laws, you may see results of your pathology and/or laboratory studies on MyChart before the doctors have had a chance to review them. We understand that in some cases there may be results that are confusing or concerning to you. Please understand that not all results are received at the same time and often the doctors may need to interpret multiple results in order to provide you with the best plan of care or course of treatment. Therefore, we ask that you please give us 2 business days to thoroughly review all your results before contacting the office for clarification. Should we see a critical lab result, you will be contacted sooner.   If You Need Anything After Your Visit  If you have any questions or concerns for your doctor, please call our main line at [phone] and press option 4 to reach your doctor's medical assistant. If no one answers, please leave a voicemail as directed and we will return your call as soon as possible. Messages left after 4 pm will be answered the following business day.   You may also send us a message via MyChart. We typically respond to MyChart messages within 1-2 business days.  For prescription refills, please ask your pharmacy to contact our office. Our fax number is [phone].  If you have an urgent issue when the clinic is closed that cannot wait until the next business day, you can page your doctor at the number below.    Please note that while we do our best to be available for urgent issues outside of office hours, we are not available 24/7.   If you have an urgent issue and are unable to reach us, you may choose to seek medical care at your doctor's office, retail clinic, urgent care center, or emergency room.  If you have a medical emergency, please immediately call 911 or go to the emergency department.  Pager Numbers  - Dr. Kowalski: [phone]  - Dr. Moye: [phone]  - Dr. Stewart:  [phone]  In the event of inclement weather, please call our main line at [phone] for an update on the status of any delays or closures.  Dermatology Medication Tips: Please keep the boxes that topical medications come in in order to help keep track of the instructions about where and how to use these. Pharmacies typically print the medication instructions only on the boxes and not directly on the medication tubes.   If your medication is too expensive, please contact our office at [phone] option 4 or send us a message through MyChart.   We are unable to tell what your co-pay for medications will be in advance as this is different depending on your insurance coverage. However, we may be able to find a substitute medication at lower cost or fill out paperwork to get insurance to cover a needed medication.   If a prior authorization is required to get your medication covered by your insurance company, please allow us 1-2 business days to complete this process.  Drug prices often vary depending on where the prescription is filled and some pharmacies may offer cheaper prices.  The website www.goodrx.com contains coupons for medications through different pharmacies. The prices here do not account for what the cost may be with help from insurance (it may be cheaper with your insurance), but the website can give you the price if you did not use any insurance.  - You can print the associated coupon and take it with   your prescription to the pharmacy.  - You may also stop by our office during regular business hours and pick up a GoodRx coupon card.  - If you need your prescription sent electronically to a different pharmacy, notify our office through [facility] MyChart or by phone at [phone] option 4.     Si Usted Necesita Algo Despus de Su Visita  Tambin puede enviarnos un mensaje a travs de MyChart. Por lo general respondemos a los mensajes de MyChart en el transcurso de 1 a 2  das hbiles.  Para renovar recetas, por favor pida a su farmacia que se ponga en contacto con nuestra oficina. Nuestro nmero de fax es el [phone].  Si tiene un asunto urgente cuando la clnica est cerrada y que no puede esperar hasta el siguiente da hbil, puede llamar/localizar a su doctor(a) al nmero que aparece a continuacin.   Por favor, tenga en cuenta que aunque hacemos todo lo posible para estar disponibles para asuntos urgentes fuera del horario de oficina, no estamos disponibles las 24 horas del da, los 7 das de la semana.   Si tiene un problema urgente y no puede comunicarse con nosotros, puede optar por buscar atencin mdica  en el consultorio de su doctor(a), en una clnica privada, en un centro de atencin urgente o en una sala de emergencias.  Si tiene una emergencia mdica, por favor llame inmediatamente al 911 o vaya a la sala de emergencias.  Nmeros de bper  - Dr. Kowalski: [phone]  - Dra. Moye: [phone]  - Dra. Stewart: [phone]  En caso de inclemencias del tiempo, por favor llame a nuestra lnea principal al [phone] para una actualizacin sobre el estado de cualquier retraso o cierre.  Consejos para la medicacin en dermatologa: Por favor, guarde las cajas en las que vienen los medicamentos de uso tpico para ayudarle a seguir las instrucciones sobre dnde y cmo usarlos. Las farmacias generalmente imprimen las instrucciones del medicamento slo en las cajas y no directamente en los tubos del medicamento.   Si su medicamento es muy caro, por favor, pngase en contacto con nuestra oficina llamando al [phone] y presione la opcin 4 o envenos un mensaje a travs de MyChart.   No podemos decirle cul ser su copago por los medicamentos por adelantado ya que esto es diferente dependiendo de la cobertura de su seguro. Sin embargo, es posible que podamos encontrar un medicamento sustituto a menor costo o llenar un formulario para que el  seguro cubra el medicamento que se considera necesario.   Si se requiere una autorizacin previa para que su compaa de seguros cubra su medicamento, por favor permtanos de 1 a 2 das hbiles para completar este proceso.  Los precios de los medicamentos varan con frecuencia dependiendo del lugar de dnde se surte la receta y alguna farmacias pueden ofrecer precios ms baratos.  El sitio web www.goodrx.com tiene cupones para medicamentos de diferentes farmacias. Los precios aqu no tienen en cuenta lo que podra costar con la ayuda del seguro (puede ser ms barato con su seguro), pero el sitio web puede darle el precio si no utiliz ningn seguro.  - Puede imprimir el cupn correspondiente y llevarlo con su receta a la farmacia.  - Tambin puede pasar por nuestra oficina durante el horario de atencin regular y recoger una tarjeta de cupones de GoodRx.  - Si necesita que su receta se enve electrnicamente a una farmacia diferente, informe a nuestra oficina a travs de MyChart de [place]   o por telfono llamando al [phone] y presione la opcin 4.  

## 2022-03-27 NOTE — Progress Notes (Signed)
   Follow-Up Visit   Subjective  Kimberly Torres is a 41 y.o. female who presents for the following: Facial Elastosis (Patient here today for Botox injections).  The following portions of the chart were reviewed this encounter and updated as appropriate:   Tobacco  Allergies  Meds  Problems  Med Hx  Surg Hx  Fam Hx     Review of Systems:  No other skin or systemic complaints except as noted in HPI or Assessment and Plan.  Objective  Well appearing patient in no apparent distress; mood and affect are within normal limits.  A focused examination was performed including the face. Relevant physical exam findings are noted in the Assessment and Plan.  Face Rhytides and volume loss.       Assessment & Plan  Elastosis of skin Face  Botox 37.5 units injected as marked: - Frown complex 20 units  - Crow's feet 10 units (5 each side) - forehead 7.5 units  Since patient still experiencing movement in the frown complex consider increasing corrugators to 7.5 units each and continuing procerus 5 unit injection at follow up appointment.   Botox Injection - Face Location: See attached image  Informed consent: Discussed risks (infection, pain, bleeding, bruising, swelling, allergic reaction, paralysis of nearby muscles, eyelid droop, double vision, neck weakness, difficulty breathing, headache, undesirable cosmetic result, and need for additional treatment) and benefits of the procedure, as well as the alternatives.  Informed consent was obtained.  Preparation: The area was cleansed with alcohol.  Procedure Details:  Botox was injected into the dermis with a 30-gauge needle. Pressure applied to any bleeding. Ice packs offered for swelling.  Lot Number:  [id number] Expiration:  05/2024  Total Units Injected:    Plan: Patient was instructed to remain upright for 4 hours. Patient was instructed to avoid massaging the face and avoid vigorous exercise for the rest of the day. Tylenol may  be used for headache.  Allow 2 weeks before returning to clinic for additional dosing as needed. Patient will call for any problems.    Return in about 4 months (around 07/28/2022) for Botox injections.  Luther Redo, CMA, am acting as scribe for Sarina Ser, MD . Documentation: I have reviewed the above documentation for accuracy and completeness, and I agree with the above.  Sarina Ser, MD

## 2022-03-28 ENCOUNTER — Other Ambulatory Visit: Payer: Self-pay

## 2022-03-29 ENCOUNTER — Encounter (INDEPENDENT_AMBULATORY_CARE_PROVIDER_SITE_OTHER): Payer: Self-pay | Admitting: Nurse Practitioner

## 2022-03-29 ENCOUNTER — Ambulatory Visit (INDEPENDENT_AMBULATORY_CARE_PROVIDER_SITE_OTHER): Payer: 59 | Admitting: Nurse Practitioner

## 2022-03-29 VITALS — BP 177/78 | HR 83 | Resp 17 | Ht 68.0 in | Wt 186.6 lb

## 2022-03-29 DIAGNOSIS — I83813 Varicose veins of bilateral lower extremities with pain: Secondary | ICD-10-CM

## 2022-04-01 ENCOUNTER — Encounter (INDEPENDENT_AMBULATORY_CARE_PROVIDER_SITE_OTHER): Payer: Self-pay | Admitting: Nurse Practitioner

## 2022-04-01 NOTE — Progress Notes (Signed)
Varicose veins of left lower extremity with inflammation (454.1  I83.10) Current Plans   Indication: Patient presents with symptomatic varicose veins of the left lower extremity.   Procedure: Sclerotherapy using hypertonic saline mixed with 1% Lidocaine was performed on the left lower extremity. Compression wraps were placed. The patient tolerated the procedure well. 

## 2022-04-02 ENCOUNTER — Encounter: Payer: Self-pay | Admitting: Dermatology

## 2022-04-02 ENCOUNTER — Encounter (INDEPENDENT_AMBULATORY_CARE_PROVIDER_SITE_OTHER): Payer: Self-pay

## 2022-04-04 ENCOUNTER — Other Ambulatory Visit: Payer: Self-pay

## 2022-04-25 ENCOUNTER — Ambulatory Visit (INDEPENDENT_AMBULATORY_CARE_PROVIDER_SITE_OTHER): Payer: 59 | Admitting: Nurse Practitioner

## 2022-04-25 ENCOUNTER — Encounter (INDEPENDENT_AMBULATORY_CARE_PROVIDER_SITE_OTHER): Payer: Self-pay | Admitting: Nurse Practitioner

## 2022-04-25 VITALS — BP 109/84 | HR 81 | Resp 16 | Ht 68.0 in | Wt 188.0 lb

## 2022-04-25 DIAGNOSIS — E669 Obesity, unspecified: Secondary | ICD-10-CM | POA: Insufficient documentation

## 2022-04-25 DIAGNOSIS — I83813 Varicose veins of bilateral lower extremities with pain: Secondary | ICD-10-CM

## 2022-04-25 DIAGNOSIS — Z1211 Encounter for screening for malignant neoplasm of colon: Secondary | ICD-10-CM | POA: Insufficient documentation

## 2022-04-25 DIAGNOSIS — Z1231 Encounter for screening mammogram for malignant neoplasm of breast: Secondary | ICD-10-CM | POA: Insufficient documentation

## 2022-04-27 ENCOUNTER — Encounter (INDEPENDENT_AMBULATORY_CARE_PROVIDER_SITE_OTHER): Payer: Self-pay | Admitting: Nurse Practitioner

## 2022-04-27 NOTE — Progress Notes (Signed)
Subjective:    Patient ID: Kimberly Torres, female    DOB: 03/09/1981, 41 y.o.   MRN: [id number] Chief Complaint  Patient presents with   Follow-up    4 week no studies    Kimberly Torres is a 41 y.o. female.  She returns today for follow-up evaluation after undergoing sclerotherapy on her left lower extremity.  She previously had an endovenous laser ablation of the left great saphenous vein.  She recently underwent sclerotherapy of the left lower extremity.  She notes that there has been some slight improvement but she still continues to have heaviness and achiness in her left lower extremity.  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     Review of Systems  All other systems reviewed and are negative.      Objective:   Physical Exam Vitals reviewed.  HENT:     Head: Normocephalic.  Cardiovascular:     Rate and Rhythm: Normal rate.     Pulses: Normal pulses.  Pulmonary:     Effort: Pulmonary effort is normal.  Skin:    General: Skin is warm and dry.  Neurological:     Mental Status: She is alert and oriented to person, place, and time.  Psychiatric:        Mood and Affect: Mood normal.        Behavior: Behavior normal.        Thought Content: Thought content normal.        Judgment: Judgment normal.     BP 109/84 (BP Location: Left Arm)   Pulse 81   Resp 16   Ht '5\' 8"'$  (1.727 m)   Wt 188 lb (85.3 kg)   BMI 28.59 kg/m   Past Medical History:  Diagnosis Date   Acne    sees derm- on accutane   Anxiety    Chondromalacia of right knee    right   Depression    Dysplastic nevus 05/16/2009   R deltoid - moderate   Dysplastic nevus 09/06/2009   R med knee - moderate   Dysrhythmia    hx SVT   H/O seasonal allergies    Headache(784.0)    cluster migraines   Insomnia    Sinus tachycardia    eval by Dr. Stanford Breed in 2007: monior revealed sinus  tach. vs EAT; Echo 10/07: EF 70%   Varicose veins    eval. by vascular surgeon and procedure scheduled to correct    Social History   Socioeconomic History   Marital status: Married    Spouse name: Not on file   Number of children: Not on file   Years of education: Not on file   Highest education level: Not on file  Occupational History   Occupation: pharmacist    Employer: [employer]  Tobacco Use   Smoking status: Never   Smokeless tobacco: Never  Vaping Use   Vaping Use: Never used  Substance and Sexual Activity   Alcohol use: Yes    Alcohol/week: 0.0 standard drinks of alcohol    Comment: occasional to rare   Drug use: No   Sexual activity: Yes    Partners: Male    Birth control/protection: None  Other Topics Concern   Not on file  Social History Narrative   Family History:   GF CAD with MI in his 32s   MGM breast cancer in her 70s  GM with bipolar      Social History:   Marital Status: Married   Children: 1   Occupation: Software engineer   Her husband is a Engineer, water         Social Determinants of Radio broadcast assistant Strain: Not on file  Food Insecurity: Not on file  Transportation Needs: Not on file  Physical Activity: Not on file  Stress: Not on file  Social Connections: Not on file  Intimate Partner Violence: Not on file    Past Surgical History:  Procedure Laterality Date   CESAREAN SECTION  2009   DIAGNOSTIC LAPAROSCOPY  2010   scar tissue   KNEE SURGERY  04/10/2010   LUMBAR LAMINECTOMY/DECOMPRESSION MICRODISCECTOMY Right 09/28/2013   Procedure: LUMBAR LAMINECTOMY/DECOMPRESSION MICRODISCECTOMY 1 LEVEL,LUMBAR  THREE-FOUR RIGHT ;  Surgeon: Charlie Pitter, MD;  Location: [facility];  Service: Neurosurgery;  Laterality: Right;  right   MYRINGOTOMY      Family History  Problem Relation Age of Onset   Hypertension Mother    ADD / ADHD Mother    Coronary artery disease Other        grandfather   Heart attack Other         grandfather   Breast cancer Other        grandmother   Bipolar disorder Other        grandmother   ADD / ADHD Sister    COPD Maternal Grandfather    Breast cancer Maternal Grandmother 68    Allergies  Allergen Reactions   Covid-19 Mrna Vacc (Moderna) Anaphylaxis    WAS GIVEN THE PFIZER NOT MODERNA   Triamcinolone Acetonide Anaphylaxis   Covid-19 (Mrna) Vaccine        Latest Ref Rng & Units 06/03/2021    9:13 AM 11/03/2020    7:45 AM 10/19/2019    1:23 PM  CBC  WBC 4.0 - 10.5 K/uL 6.2  5.9  9.3   Hemoglobin 12.0 - 15.0 g/dL 12.8  13.0  12.6   Hematocrit 36.0 - 46.0 % 38.9  38.5  37.3   Platelets 150 - 400 K/uL 285  277.0  252.0       CMP     Component Value Date/Time   NA 137 06/03/2021 0913   NA 141 04/06/2014 0935   K 4.0 06/03/2021 0913   K 4.1 04/06/2014 0935   CL 104 06/03/2021 0913   CL 107 04/06/2014 0935   CO2 26 06/03/2021 0913   CO2 28 04/06/2014 0935   GLUCOSE 103 (H) 06/03/2021 0913   GLUCOSE 91 04/06/2014 0935   BUN 10 06/03/2021 0913   BUN 12 04/06/2014 0935   CREATININE 0.73 06/03/2021 0913   CREATININE 0.92 07/22/2019 1512   CALCIUM 9.0 06/03/2021 0913   CALCIUM 8.1 (L) 04/06/2014 0935   PROT 7.0 06/03/2021 0913   ALBUMIN 3.9 06/03/2021 0913   AST 10 (L) 06/03/2021 0913   ALT 9 06/03/2021 0913   ALKPHOS 51 06/03/2021 0913   BILITOT 0.3 06/03/2021 0913   GFRNONAA >60 06/03/2021 0913   GFRNONAA >60 04/06/2014 0935   GFRAA >60 04/06/2014 0935     No results found.     Assessment & Plan:   1. Varicose veins of leg with pain, bilateral The patient has had a successful left lower extremity ablation.  She has had several rounds of sclerotherapy however she still continues to have some continued discomfort and some notable residual varicosities.  I do think  it would be reasonable to consider sclerotherapy for her residual varicosities that are painful.  I discussed the risks and benefits of sclerotherapy.  We will submit this for  approval.   Current Outpatient Medications on File Prior to Visit  Medication Sig Dispense Refill   Cholecalciferol (DIALYVITE VITAMIN D 5000 PO) Take by mouth daily.     EPINEPHrine 0.3 mg/0.3 mL IJ SOAJ injection Inject 0.3 mLs (0.3 mg total) into the muscle as needed for anaphylaxis. 1 each 1   fluticasone (FLONASE) 50 MCG/ACT nasal spray Place 2 sprays into both nostrils daily as needed for allergies.      folic acid (FOLVITE) 1 MG tablet Take 1 tablet by mouth every day 90 tablet 3   folic acid (FOLVITE) 1 MG tablet Take 1 tablet every day by oral route. 90 tablet 3   melatonin 5 MG TABS Take 5 mg by mouth at bedtime as needed.     mometasone (ELOCON) 0.1 % lotion Apply daily to affected areas on scalp as needed 30 mL 2   Norethindrone-Ethinyl Estradiol-Fe Biphas (LO LOESTRIN FE) 1 MG-10 MCG / 10 MCG tablet continuous active pill 112 tablet 3   Omega-3 Fatty Acids (FISH OIL) 1000 MG CAPS Take 2 capsules by mouth daily.     tretinoin (RETIN-A) 0.05 % cream Apply a pea-sized amount to dry face as bedtime 45 g 2   WEGOVY 2.4 MG/0.75ML SOAJ Inject 2.4 mg subcutaneously every week for weight loss 9 mL 0   metoprolol tartrate (LOPRESSOR) 25 MG tablet TAKE 1 TABLET BY MOUTH 2 TIMES DAILY AS NEEDED FOR ELEVATED BLOOD PRESSURE, HEADACHE OR HIGH PULSE RATE 180 tablet 3   sertraline (ZOLOFT) 100 MG tablet TAKE 1 TABLET BY MOUTH ONCE DAILY WITH BREAKFAST 90 tablet 1   No current facility-administered medications on file prior to visit.    There are no Patient Instructions on file for this visit. No follow-ups on file.   Kris Hartmann, NP

## 2022-04-30 DIAGNOSIS — H5203 Hypermetropia, bilateral: Secondary | ICD-10-CM | POA: Diagnosis not present

## 2022-05-02 ENCOUNTER — Telehealth: Payer: Self-pay | Admitting: Family Medicine

## 2022-05-02 DIAGNOSIS — Z0000 Encounter for general adult medical examination without abnormal findings: Secondary | ICD-10-CM

## 2022-05-02 DIAGNOSIS — E559 Vitamin D deficiency, unspecified: Secondary | ICD-10-CM

## 2022-05-02 NOTE — Telephone Encounter (Signed)
-----   Message from Velna Hatchet, RT sent at 04/23/2022 10:22 AM EST ----- Regarding: 11/30 lab Patient is scheduled for cpx, please order future labs.  Thanks, Anda Kraft

## 2022-05-03 ENCOUNTER — Other Ambulatory Visit (INDEPENDENT_AMBULATORY_CARE_PROVIDER_SITE_OTHER): Payer: 59

## 2022-05-03 DIAGNOSIS — Z0000 Encounter for general adult medical examination without abnormal findings: Secondary | ICD-10-CM

## 2022-05-03 DIAGNOSIS — E559 Vitamin D deficiency, unspecified: Secondary | ICD-10-CM | POA: Diagnosis not present

## 2022-05-03 LAB — COMPREHENSIVE METABOLIC PANEL
ALT: 12 U/L (ref 0–35)
AST: 13 U/L (ref 0–37)
Albumin: 3.9 g/dL (ref 3.5–5.2)
Alkaline Phosphatase: 45 U/L (ref 39–117)
BUN: 14 mg/dL (ref 6–23)
CO2: 29 mEq/L (ref 19–32)
Calcium: 8.7 mg/dL (ref 8.4–10.5)
Chloride: 106 mEq/L (ref 96–112)
Creatinine, Ser: 0.82 mg/dL (ref 0.40–1.20)
GFR: 87.85 mL/min (ref >60.00)
Glucose, Bld: 92 mg/dL (ref 70–99)
Potassium: 4.3 mEq/L (ref 3.5–5.1)
Sodium: 140 mEq/L (ref 135–145)
Total Bilirubin: 0.5 mg/dL (ref 0.2–1.2)
Total Protein: 6.4 g/dL (ref 6.0–8.3)

## 2022-05-03 LAB — CBC WITH DIFFERENTIAL/PLATELET
Basophils Absolute: 0 10*3/uL (ref 0.0–0.1)
Basophils Relative: 0.6 % (ref 0.0–3.0)
Eosinophils Absolute: 0.1 10*3/uL (ref 0.0–0.7)
Eosinophils Relative: 2.2 % (ref 0.0–5.0)
HCT: 37.7 % (ref 36.0–46.0)
Hemoglobin: 12.8 g/dL (ref 12.0–15.0)
Lymphocytes Relative: 38.4 % (ref 12.0–46.0)
Lymphs Abs: 2.1 10*3/uL (ref 0.7–4.0)
MCHC: 34 g/dL (ref 30.0–36.0)
MCV: 88.9 fl (ref 78.0–100.0)
Monocytes Absolute: 0.5 10*3/uL (ref 0.1–1.0)
Monocytes Relative: 8.8 % (ref 3.0–12.0)
Neutro Abs: 2.7 10*3/uL (ref 1.4–7.7)
Neutrophils Relative %: 50 % (ref 43.0–77.0)
Platelets: 274 10*3/uL (ref 150.0–400.0)
RBC: 4.24 Mil/uL (ref 3.87–5.11)
RDW: 12.7 % (ref 11.5–15.5)
WBC: 5.4 10*3/uL (ref 4.0–10.5)

## 2022-05-03 LAB — LIPID PANEL
Cholesterol: 140 mg/dL (ref 0–200)
HDL: 51.1 mg/dL (ref >39.00)
LDL Cholesterol: 74 mg/dL (ref 0–99)
NonHDL: 88.94
Total CHOL/HDL Ratio: 3
Triglycerides: 76 mg/dL (ref 0.0–149.0)
VLDL: 15.2 mg/dL (ref 0.0–40.0)

## 2022-05-03 LAB — VITAMIN D 25 HYDROXY (VIT D DEFICIENCY, FRACTURES): VITD: 60.07 ng/mL (ref 30.00–100.00)

## 2022-05-03 LAB — TSH: TSH: 2.56 u[IU]/mL (ref 0.35–5.50)

## 2022-05-07 ENCOUNTER — Ambulatory Visit (INDEPENDENT_AMBULATORY_CARE_PROVIDER_SITE_OTHER): Payer: 59 | Admitting: Family Medicine

## 2022-05-07 ENCOUNTER — Encounter: Payer: Self-pay | Admitting: Family Medicine

## 2022-05-07 ENCOUNTER — Other Ambulatory Visit: Payer: Self-pay

## 2022-05-07 VITALS — BP 124/82 | HR 90 | Temp 97.3°F | Ht 66.75 in | Wt 183.1 lb

## 2022-05-07 DIAGNOSIS — Z0000 Encounter for general adult medical examination without abnormal findings: Secondary | ICD-10-CM | POA: Diagnosis not present

## 2022-05-07 DIAGNOSIS — F411 Generalized anxiety disorder: Secondary | ICD-10-CM | POA: Diagnosis not present

## 2022-05-07 DIAGNOSIS — R000 Tachycardia, unspecified: Secondary | ICD-10-CM

## 2022-05-07 DIAGNOSIS — E559 Vitamin D deficiency, unspecified: Secondary | ICD-10-CM | POA: Diagnosis not present

## 2022-05-07 DIAGNOSIS — F909 Attention-deficit hyperactivity disorder, unspecified type: Secondary | ICD-10-CM | POA: Diagnosis not present

## 2022-05-07 DIAGNOSIS — Z1231 Encounter for screening mammogram for malignant neoplasm of breast: Secondary | ICD-10-CM

## 2022-05-07 DIAGNOSIS — F410 Panic disorder [episodic paroxysmal anxiety] without agoraphobia: Secondary | ICD-10-CM | POA: Diagnosis not present

## 2022-05-07 MED ORDER — METOPROLOL TARTRATE 25 MG PO TABS
25.0000 mg | ORAL_TABLET | Freq: Two times a day (BID) | ORAL | 3 refills | Status: DC
  Filled 2022-05-07: qty 180, 90d supply, fill #0
  Filled 2022-07-27: qty 180, 90d supply, fill #1
  Filled 2022-10-30: qty 180, 90d supply, fill #2
  Filled 2023-01-15: qty 180, 90d supply, fill #3

## 2022-05-07 MED ORDER — SERTRALINE HCL 50 MG PO TABS
50.0000 mg | ORAL_TABLET | Freq: Every day | ORAL | 3 refills | Status: DC
  Filled 2022-05-07: qty 90, 90d supply, fill #0
  Filled 2022-07-27 – 2022-07-31 (×2): qty 90, 90d supply, fill #1
  Filled 2022-10-30: qty 90, 90d supply, fill #2
  Filled 2023-01-15: qty 90, 90d supply, fill #3

## 2022-05-07 NOTE — Assessment & Plan Note (Signed)
Vitamin D level is therapeutic with current supplementation Disc importance of this to bone and overall health  Continue 5000 iu D3 daily

## 2022-05-07 NOTE — Assessment & Plan Note (Addendum)
Reviewed health habits including diet and exercise and skin cancer prevention Reviewed appropriate screening tests for age  Also reviewed health mt list, fam hx and immunization status , as well as social and family history   See HPI Labs reviewed  Commended intentional wt loss , enc strength training  Pap and gyn f/u utd  Mammogram due in February (ordered and pt will call to schedule) Good cholesterol profile  Enc sun protection

## 2022-05-07 NOTE — Assessment & Plan Note (Signed)
Takes metoprolol as needed

## 2022-05-07 NOTE — Assessment & Plan Note (Signed)
Coping well without medication  Anxiety is improved

## 2022-05-07 NOTE — Assessment & Plan Note (Signed)
Treated with sertraline 50 mg daily  No longer under psychiatric care- we will take over this px Reviewed stressors/ coping techniques/symptoms/ support sources/ tx options and side effects in detail today Encouraged good self care

## 2022-05-07 NOTE — Patient Instructions (Addendum)
Take care of yourself  Keep up the great diet and exercise     Continue dermatology and gyn visits   Go ahead and schedule your mammogram

## 2022-05-07 NOTE — Progress Notes (Signed)
Subjective:    Patient ID: Kimberly Torres, female    DOB: December 28, 1980, 41 y.o.   MRN: [id number]  HPI Here for health maintenance exam and to review chronic medical problems    Wt Readings from Last 3 Encounters:  05/07/22 183 lb 2 oz (83.1 kg)  04/25/22 188 lb (85.3 kg)  03/29/22 186 lb 9.6 oz (84.6 kg)   28.90 kg/m  Wt was 224 in June of 2022  Feeling good  Taking care of herself   Exercise - elliptical 2 times per week  Plans to start some weights soon , has kettle bells to start also    Immunization History  Administered Date(s) Administered   Influenza Whole 03/05/2007, 01/23/2009, 04/04/2010   Influenza,inj,Quad PF,6+ Mos 02/15/2014   Influenza-Unspecified 05/22/2013, 02/12/2017, 03/20/2018, 03/05/2019, 02/22/2020, 03/01/2021   Td 10/02/2004, 10/22/2004   Tdap 08/25/2014   Unspecified SARS-COV-2 Vaccination 06/01/2019   Health Maintenance Due  Topic Date Due   Hepatitis C Screening  Never done   INFLUENZA VACCINE  01/02/2022   Flu shot -had this season  Allergic to covid vaccine   Saw gyn in August  Pap 12/2020  nl /gyn  OC Menses: every 3 months - heavier as she gets older  Loestrin fe : continuous    Mammogram 07/2021  Self breast exam : no changes  Bca in MGM over 44 y old   Mood:  H/o ADD and depression and GAD  Psychiatric care - Dr Nicolasa Ducking released her , on sertraline and needs to change to our care      Sertraline 50 mg daily and that works well  Doing well recently  ADD is tolerable if anxiety is better    H/o sinus tach Metoprolol -periodically  BP Readings from Last 3 Encounters:  05/07/22 124/82  04/25/22 109/84  03/29/22 (!) 177/78   Pulse Readings from Last 3 Encounters:  05/07/22 90  04/25/22 81  03/29/22 83   HR has been slt higher on wegovy     Vit D def Level of 60 Taking 5000 iu daily   Cholesterol Lab Results  Component Value Date   CHOL 140 05/03/2022   CHOL 139 11/03/2020   CHOL 153 07/22/2019    Lab Results  Component Value Date   HDL 51.10 05/03/2022   HDL 45.30 11/03/2020   HDL 50 07/22/2019   Lab Results  Component Value Date   LDLCALC 74 05/03/2022   LDLCALC 71 11/03/2020   LDLCALC 81 07/22/2019   Lab Results  Component Value Date   TRIG 76.0 05/03/2022   TRIG 112.0 11/03/2020   TRIG 121 07/22/2019   Lab Results  Component Value Date   CHOLHDL 3 05/03/2022   CHOLHDL 3 11/03/2020   CHOLHDL 3.1 07/22/2019   No results found for: "LDLDIRECT"  Eats healthy  Lots of fruits and veggies  Trying to get more protein-this is harder Better choices  More yogurt      Other labs  Results for orders placed or performed in visit on 05/03/22  VITAMIN D 25 Hydroxy (Vit-D Deficiency, Fractures)  Result Value Ref Range   VITD 60.07 30.00 - 100.00 ng/mL  CBC with Differential/Platelet  Result Value Ref Range   WBC 5.4 4.0 - 10.5 K/uL   RBC 4.24 3.87 - 5.11 Mil/uL   Hemoglobin 12.8 12.0 - 15.0 g/dL   HCT 37.7 36.0 - 46.0 %   MCV 88.9 78.0 - 100.0 fl   MCHC 34.0 30.0 - 36.0 g/dL  RDW 12.7 11.5 - 15.5 %   Platelets 274.0 150.0 - 400.0 K/uL   Neutrophils Relative % 50.0 43.0 - 77.0 %   Lymphocytes Relative 38.4 12.0 - 46.0 %   Monocytes Relative 8.8 3.0 - 12.0 %   Eosinophils Relative 2.2 0.0 - 5.0 %   Basophils Relative 0.6 0.0 - 3.0 %   Neutro Abs 2.7 1.4 - 7.7 K/uL   Lymphs Abs 2.1 0.7 - 4.0 K/uL   Monocytes Absolute 0.5 0.1 - 1.0 K/uL   Eosinophils Absolute 0.1 0.0 - 0.7 K/uL   Basophils Absolute 0.0 0.0 - 0.1 K/uL  Comprehensive metabolic panel  Result Value Ref Range   Sodium 140 135 - 145 mEq/L   Potassium 4.3 3.5 - 5.1 mEq/L   Chloride 106 96 - 112 mEq/L   CO2 29 19 - 32 mEq/L   Glucose, Bld 92 70 - 99 mg/dL   BUN 14 6 - 23 mg/dL   Creatinine, Ser 0.82 0.40 - 1.20 mg/dL   Total Bilirubin 0.5 0.2 - 1.2 mg/dL   Alkaline Phosphatase 45 39 - 117 U/L   AST 13 0 - 37 U/L   ALT 12 0 - 35 U/L   Total Protein 6.4 6.0 - 8.3 g/dL   Albumin 3.9 3.5 -  5.2 g/dL   GFR 87.85 >60.00 mL/min   Calcium 8.7 8.4 - 10.5 mg/dL  Lipid panel  Result Value Ref Range   Cholesterol 140 0 - 200 mg/dL   Triglycerides 76.0 0.0 - 149.0 mg/dL   HDL 51.10 >39.00 mg/dL   VLDL 15.2 0.0 - 40.0 mg/dL   LDL Cholesterol 74 0 - 99 mg/dL   Total CHOL/HDL Ratio 3    NonHDL 88.94   TSH  Result Value Ref Range   TSH 2.56 0.35 - 5.50 uIU/mL      Wegovy- has done very well with that  Caused mild constipation  She inc fiber  Some magnesium- helps also    Patient Active Problem List   Diagnosis Date Noted   Encounter for screening mammogram for breast cancer 04/25/2022   Varicose veins of leg with pain, bilateral 10/26/2021   Attention deficit hyperactivity disorder 11/23/2020   Depressive disorder 11/23/2020   History of COVID-19 06/21/2020   Fatigue 10/15/2019   Vitamin D deficiency 07/27/2019   Generalized anxiety disorder with panic attacks 07/13/2019   Immunization reaction 06/11/2019   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   HNP (herniated nucleus pulposus), lumbar 09/28/2013   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   Cluster headache 02/15/2012   Routine general medical examination at a health care facility 01/28/2012   Sinus tachycardia 09/11/2010   HEMANGIOMA, HEPATIC 03/14/2009   Attention deficit hyperactivity disorder (ADHD) 06/25/2007   SCOLIOSIS 06/25/2007   Past Medical History:  Diagnosis Date   Acne    sees derm- on accutane   Anxiety    Chondromalacia of right knee    right   Depression    Dysplastic nevus 05/16/2009   R deltoid - moderate   Dysplastic nevus 09/06/2009   R med knee - moderate   Dysrhythmia    hx SVT   H/O seasonal allergies    Headache(784.0)    cluster migraines   Insomnia    Sinus tachycardia    eval by Dr. Stanford Breed in 2007: monior revealed sinus tach. vs EAT; Echo 10/07: EF 70%   Varicose veins    eval. by vascular surgeon and procedure scheduled to correct   Past Surgical  History:  Procedure Laterality Date   CESAREAN SECTION  2009   DIAGNOSTIC LAPAROSCOPY  2010   scar tissue   KNEE SURGERY  04/10/2010   LUMBAR LAMINECTOMY/DECOMPRESSION MICRODISCECTOMY Right 09/28/2013   Procedure: LUMBAR LAMINECTOMY/DECOMPRESSION MICRODISCECTOMY 1 LEVEL,LUMBAR  THREE-FOUR RIGHT ;  Surgeon: Charlie Pitter, MD;  Location: [facility];  Service: Neurosurgery;  Laterality: Right;  right   MYRINGOTOMY     Social History   Tobacco Use   Smoking status: Never   Smokeless tobacco: Never  Vaping Use   Vaping Use: Never used  Substance Use Topics   Alcohol use: Yes    Alcohol/week: 0.0 standard drinks of alcohol    Comment: occasional to rare   Drug use: No   Family History  Problem Relation Age of Onset   Hypertension Mother    ADD / ADHD Mother    Coronary artery disease Other        grandfather   Heart attack Other        grandfather   Breast cancer Other        grandmother   Bipolar disorder Other        grandmother   ADD / ADHD Sister    COPD Maternal Grandfather    Breast cancer Maternal Grandmother 61   Allergies  Allergen Reactions   Covid-19 Mrna Vacc (Moderna) Anaphylaxis    WAS GIVEN THE PFIZER NOT MODERNA   Triamcinolone Acetonide Anaphylaxis   Covid-19 (Mrna) Vaccine    Current Outpatient Medications on File Prior to Visit  Medication Sig Dispense Refill   Cholecalciferol (DIALYVITE VITAMIN D 5000 PO) Take by mouth daily.     EPINEPHrine 0.3 mg/0.3 mL IJ SOAJ injection Inject 0.3 mLs (0.3 mg total) into the muscle as needed for anaphylaxis. 1 each 1   fluticasone (FLONASE) 50 MCG/ACT nasal spray Place 2 sprays into both nostrils daily as needed for allergies.      folic acid (FOLVITE) 1 MG tablet Take 1 tablet by mouth every day 90 tablet 3   folic acid (FOLVITE) 1 MG tablet Take 1 tablet every day by oral route. 90 tablet 3   melatonin 5 MG TABS Take 5 mg by mouth at bedtime as needed.     mometasone (ELOCON) 0.1 % lotion Apply daily to affected  areas on scalp as needed 30 mL 2   Norethindrone-Ethinyl Estradiol-Fe Biphas (LO LOESTRIN FE) 1 MG-10 MCG / 10 MCG tablet continuous active pill 112 tablet 3   Omega-3 Fatty Acids (FISH OIL) 1000 MG CAPS Take 2 capsules by mouth daily.     tretinoin (RETIN-A) 0.05 % cream Apply a pea-sized amount to dry face as bedtime 45 g 2   WEGOVY 2.4 MG/0.75ML SOAJ Inject 2.4 mg subcutaneously every week for weight loss 9 mL 0   No current facility-administered medications on file prior to visit.    Review of Systems  Constitutional:  Negative for activity change, appetite change, fatigue, fever and unexpected weight change.  HENT:  Negative for congestion, ear pain, rhinorrhea, sinus pressure and sore throat.   Eyes:  Negative for pain, redness and visual disturbance.  Respiratory:  Negative for cough, shortness of breath and wheezing.   Cardiovascular:  Negative for chest pain and palpitations.  Gastrointestinal:  Negative for abdominal pain, blood in stool, constipation and diarrhea.  Endocrine: Negative for polydipsia and polyuria.  Genitourinary:  Negative for dysuria, frequency and urgency.  Musculoskeletal:  Negative for arthralgias, back pain and myalgias.  Skin:  Negative for pallor and rash.  Allergic/Immunologic: Negative for environmental allergies.  Neurological:  Negative for dizziness, syncope and headaches.  Hematological:  Negative for adenopathy. Does not bruise/bleed easily.  Psychiatric/Behavioral:  Negative for decreased concentration and dysphoric mood. The patient is not nervous/anxious.        Objective:   Physical Exam Constitutional:      General: She is not in acute distress.    Appearance: Normal appearance. She is well-developed and normal weight. She is not ill-appearing or diaphoretic.  HENT:     Head: Normocephalic and atraumatic.     Right Ear: Tympanic membrane, ear canal and external ear normal.     Left Ear: Tympanic membrane, ear canal and external ear  normal.     Nose: Nose normal. No congestion.     Mouth/Throat:     Mouth: Mucous membranes are moist.     Pharynx: Oropharynx is clear. No posterior oropharyngeal erythema.  Eyes:     General: No scleral icterus.    Extraocular Movements: Extraocular movements intact.     Conjunctiva/sclera: Conjunctivae normal.     Pupils: Pupils are equal, round, and reactive to light.  Neck:     Thyroid: No thyromegaly.     Vascular: No carotid bruit or JVD.  Cardiovascular:     Rate and Rhythm: Normal rate and regular rhythm.     Pulses: Normal pulses.     Heart sounds: Normal heart sounds.     No gallop.  Pulmonary:     Effort: Pulmonary effort is normal. No respiratory distress.     Breath sounds: Normal breath sounds. No wheezing.     Comments: Good air exch Chest:     Chest wall: No tenderness.  Abdominal:     General: Bowel sounds are normal. There is no distension or abdominal bruit.     Palpations: Abdomen is soft. There is no mass.     Tenderness: There is no abdominal tenderness.     Hernia: No hernia is present.  Genitourinary:    Comments: Breast and pelvic exam utd from gyn provider   Musculoskeletal:        General: No tenderness. Normal range of motion.     Cervical back: Normal range of motion and neck supple. No rigidity. No muscular tenderness.     Right lower leg: No edema.     Left lower leg: No edema.     Comments: No kyphosis   Lymphadenopathy:     Cervical: No cervical adenopathy.  Skin:    General: Skin is warm and dry.     Coloration: Skin is not pale.     Findings: No erythema or rash.     Comments: Solar lentigines diffusely   Neurological:     Mental Status: She is alert. Mental status is at baseline.     Cranial Nerves: No cranial nerve deficit.     Motor: No abnormal muscle tone.     Coordination: Coordination normal.     Gait: Gait normal.     Deep Tendon Reflexes: Reflexes are normal and symmetric. Reflexes normal.  Psychiatric:        Mood and  Affect: Mood normal.        Cognition and Memory: Cognition and memory normal.           Assessment & Plan:   Problem List Items Addressed This Visit       Other   Attention deficit hyperactivity disorder (ADHD)    Coping  well without medication  Anxiety is improved      Encounter for screening mammogram for breast cancer   Relevant Orders   MM 3D SCREEN BREAST BILATERAL   Generalized anxiety disorder with panic attacks    Treated with sertraline 50 mg daily  No longer under psychiatric care- we will take over this px Reviewed stressors/ coping techniques/symptoms/ support sources/ tx options and side effects in detail today Encouraged good self care       Relevant Medications   sertraline (ZOLOFT) 50 MG tablet   Routine general medical examination at a health care facility - Primary    Reviewed health habits including diet and exercise and skin cancer prevention Reviewed appropriate screening tests for age  Also reviewed health mt list, fam hx and immunization status , as well as social and family history   See HPI Labs reviewed  Commended intentional wt loss , enc strength training  Pap and gyn f/u utd  Mammogram due in February (ordered and pt will call to schedule) Good cholesterol profile  Enc sun protection        Sinus tachycardia    Takes metoprolol as needed       Vitamin D deficiency    Vitamin D level is therapeutic with current supplementation Disc importance of this to bone and overall health  Continue 5000 iu D3 daily

## 2022-05-23 ENCOUNTER — Encounter (INDEPENDENT_AMBULATORY_CARE_PROVIDER_SITE_OTHER): Payer: Self-pay

## 2022-05-25 ENCOUNTER — Other Ambulatory Visit: Payer: Self-pay

## 2022-05-25 ENCOUNTER — Encounter (INDEPENDENT_AMBULATORY_CARE_PROVIDER_SITE_OTHER): Payer: Self-pay

## 2022-05-28 ENCOUNTER — Other Ambulatory Visit: Payer: Self-pay

## 2022-05-28 MED ORDER — ZEPBOUND 5 MG/0.5ML ~~LOC~~ SOAJ
5.0000 mg | SUBCUTANEOUS | 0 refills | Status: DC
  Filled 2022-05-28 – 2022-05-29 (×2): qty 2, 28d supply, fill #0

## 2022-05-29 ENCOUNTER — Other Ambulatory Visit: Payer: Self-pay

## 2022-06-14 ENCOUNTER — Other Ambulatory Visit: Payer: Self-pay

## 2022-06-14 MED ORDER — ZEPBOUND 7.5 MG/0.5ML ~~LOC~~ SOAJ
7.5000 mg | SUBCUTANEOUS | 0 refills | Status: DC
  Filled 2022-06-14: qty 2, 28d supply, fill #0

## 2022-06-20 ENCOUNTER — Other Ambulatory Visit: Payer: Self-pay

## 2022-07-03 ENCOUNTER — Other Ambulatory Visit: Payer: Self-pay

## 2022-07-03 ENCOUNTER — Ambulatory Visit (INDEPENDENT_AMBULATORY_CARE_PROVIDER_SITE_OTHER): Payer: Self-pay | Admitting: Dermatology

## 2022-07-03 VITALS — BP 122/77 | HR 82

## 2022-07-03 DIAGNOSIS — L988 Other specified disorders of the skin and subcutaneous tissue: Secondary | ICD-10-CM

## 2022-07-03 MED ORDER — ZEPBOUND 10 MG/0.5ML ~~LOC~~ SOAJ
10.0000 mg | SUBCUTANEOUS | 0 refills | Status: DC
  Filled 2022-07-03: qty 2, 28d supply, fill #0

## 2022-07-03 NOTE — Patient Instructions (Signed)
Due to recent changes in healthcare laws, you may see results of your pathology and/or laboratory studies on MyChart before the doctors have had a chance to review them. We understand that in some cases there may be results that are confusing or concerning to you. Please understand that not all results are received at the same time and often the doctors may need to interpret multiple results in order to provide you with the best plan of care or course of treatment. Therefore, we ask that you please give us 2 business days to thoroughly review all your results before contacting the office for clarification. Should we see a critical lab result, you will be contacted sooner.   If You Need Anything After Your Visit  If you have any questions or concerns for your doctor, please call our main line at [phone] and press option 4 to reach your doctor's medical assistant. If no one answers, please leave a voicemail as directed and we will return your call as soon as possible. Messages left after 4 pm will be answered the following business day.   You may also send us a message via MyChart. We typically respond to MyChart messages within 1-2 business days.  For prescription refills, please ask your pharmacy to contact our office. Our fax number is [phone].  If you have an urgent issue when the clinic is closed that cannot wait until the next business day, you can page your doctor at the number below.    Please note that while we do our best to be available for urgent issues outside of office hours, we are not available 24/7.   If you have an urgent issue and are unable to reach us, you may choose to seek medical care at your doctor's office, retail clinic, urgent care center, or emergency room.  If you have a medical emergency, please immediately call 911 or go to the emergency department.  Pager Numbers  - Dr. Kowalski: [phone]  - Dr. Moye: [phone]  - Dr. Stewart:  [phone]  In the event of inclement weather, please call our main line at [phone] for an update on the status of any delays or closures.  Dermatology Medication Tips: Please keep the boxes that topical medications come in in order to help keep track of the instructions about where and how to use these. Pharmacies typically print the medication instructions only on the boxes and not directly on the medication tubes.   If your medication is too expensive, please contact our office at [phone] option 4 or send us a message through MyChart.   We are unable to tell what your co-pay for medications will be in advance as this is different depending on your insurance coverage. However, we may be able to find a substitute medication at lower cost or fill out paperwork to get insurance to cover a needed medication.   If a prior authorization is required to get your medication covered by your insurance company, please allow us 1-2 business days to complete this process.  Drug prices often vary depending on where the prescription is filled and some pharmacies may offer cheaper prices.  The website www.goodrx.com contains coupons for medications through different pharmacies. The prices here do not account for what the cost may be with help from insurance (it may be cheaper with your insurance), but the website can give you the price if you did not use any insurance.  - You can print the associated coupon and take it with   your prescription to the pharmacy.  - You may also stop by our office during regular business hours and pick up a GoodRx coupon card.  - If you need your prescription sent electronically to a different pharmacy, notify our office through [facility] MyChart or by phone at [phone] option 4.     Si Usted Necesita Algo Despus de Su Visita  Tambin puede enviarnos un mensaje a travs de MyChart. Por lo general respondemos a los mensajes de MyChart en el transcurso de 1 a 2  das hbiles.  Para renovar recetas, por favor pida a su farmacia que se ponga en contacto con nuestra oficina. Nuestro nmero de fax es el [phone].  Si tiene un asunto urgente cuando la clnica est cerrada y que no puede esperar hasta el siguiente da hbil, puede llamar/localizar a su doctor(a) al nmero que aparece a continuacin.   Por favor, tenga en cuenta que aunque hacemos todo lo posible para estar disponibles para asuntos urgentes fuera del horario de oficina, no estamos disponibles las 24 horas del da, los 7 das de la semana.   Si tiene un problema urgente y no puede comunicarse con nosotros, puede optar por buscar atencin mdica  en el consultorio de su doctor(a), en una clnica privada, en un centro de atencin urgente o en una sala de emergencias.  Si tiene una emergencia mdica, por favor llame inmediatamente al 911 o vaya a la sala de emergencias.  Nmeros de bper  - Dr. Kowalski: [phone]  - Dra. Moye: [phone]  - Dra. Stewart: [phone]  En caso de inclemencias del tiempo, por favor llame a nuestra lnea principal al [phone] para una actualizacin sobre el estado de cualquier retraso o cierre.  Consejos para la medicacin en dermatologa: Por favor, guarde las cajas en las que vienen los medicamentos de uso tpico para ayudarle a seguir las instrucciones sobre dnde y cmo usarlos. Las farmacias generalmente imprimen las instrucciones del medicamento slo en las cajas y no directamente en los tubos del medicamento.   Si su medicamento es muy caro, por favor, pngase en contacto con nuestra oficina llamando al [phone] y presione la opcin 4 o envenos un mensaje a travs de MyChart.   No podemos decirle cul ser su copago por los medicamentos por adelantado ya que esto es diferente dependiendo de la cobertura de su seguro. Sin embargo, es posible que podamos encontrar un medicamento sustituto a menor costo o llenar un formulario para que el  seguro cubra el medicamento que se considera necesario.   Si se requiere una autorizacin previa para que su compaa de seguros cubra su medicamento, por favor permtanos de 1 a 2 das hbiles para completar este proceso.  Los precios de los medicamentos varan con frecuencia dependiendo del lugar de dnde se surte la receta y alguna farmacias pueden ofrecer precios ms baratos.  El sitio web www.goodrx.com tiene cupones para medicamentos de diferentes farmacias. Los precios aqu no tienen en cuenta lo que podra costar con la ayuda del seguro (puede ser ms barato con su seguro), pero el sitio web puede darle el precio si no utiliz ningn seguro.  - Puede imprimir el cupn correspondiente y llevarlo con su receta a la farmacia.  - Tambin puede pasar por nuestra oficina durante el horario de atencin regular y recoger una tarjeta de cupones de GoodRx.  - Si necesita que su receta se enve electrnicamente a una farmacia diferente, informe a nuestra oficina a travs de MyChart de [facility]   o por telfono llamando al [phone] y presione la opcin 4.  

## 2022-07-03 NOTE — Progress Notes (Unsigned)
   Follow-Up Visit   Subjective  Kimberly Torres is a 42 y.o. female who presents for the following: Facial Elastosis (Face, pt presents for botox).  The following portions of the chart were reviewed this encounter and updated as appropriate:   Tobacco  Allergies  Meds  Problems  Med Hx  Surg Hx  Fam Hx     Review of Systems:  No other skin or systemic complaints except as noted in HPI or Assessment and Plan.  Objective  Well appearing patient in no apparent distress; mood and affect are within normal limits.  A focused examination was performed including face. Relevant physical exam findings are noted in the Assessment and Plan.  face Rhytides and volume loss.       Assessment & Plan  Elastosis of skin face  Botox 42.5 units injected as marked: - Frown complex 25 units  - Crow's feet 5 units each side for a total of 10 units - forehead 7.5 units  Increased frown complex from 20 units to 25 since pt still had some movement  Botox Injection - face Location: frown complex, forehead, crow's feet  Informed consent: Discussed risks (infection, pain, bleeding, bruising, swelling, allergic reaction, paralysis of nearby muscles, eyelid droop, double vision, neck weakness, difficulty breathing, headache, undesirable cosmetic result, and need for additional treatment) and benefits of the procedure, as well as the alternatives.  Informed consent was obtained.  Preparation: The area was cleansed with alcohol.  Procedure Details:  Botox was injected into the dermis with a 30-gauge needle. Pressure applied to any bleeding. Ice packs offered for swelling.  Lot Number:  [id number] Expiration:  03/26  Total Units Injected:  42.5  Plan: Patient was instructed to remain upright for 4 hours. Patient was instructed to avoid massaging the face and avoid vigorous exercise for the rest of the day. Tylenol may be used for headache.  Allow 2 weeks before returning to clinic for additional  dosing as needed. Patient will call for any problems.    Return for 3-41mBotox.  I, SOthelia Pulling RMA, am acting as scribe for DSarina Ser MD . Documentation: I have reviewed the above documentation for accuracy and completeness, and I agree with the above.  DSarina Ser MD

## 2022-07-04 ENCOUNTER — Encounter: Payer: Self-pay | Admitting: Dermatology

## 2022-07-19 ENCOUNTER — Ambulatory Visit
Admission: RE | Admit: 2022-07-19 | Discharge: 2022-07-19 | Disposition: A | Discharge status: Home / Self Care | Payer: 59 | Source: Ambulatory Visit | Attending: Family Medicine | Admitting: Family Medicine

## 2022-07-19 DIAGNOSIS — Z1231 Encounter for screening mammogram for malignant neoplasm of breast: Secondary | ICD-10-CM | POA: Diagnosis not present

## 2022-07-25 ENCOUNTER — Other Ambulatory Visit: Payer: Self-pay

## 2022-07-25 MED ORDER — ZEPBOUND 10 MG/0.5ML ~~LOC~~ SOAJ
SUBCUTANEOUS | 0 refills | Status: DC
  Filled 2022-07-25: qty 2, 28d supply, fill #0

## 2022-07-31 ENCOUNTER — Other Ambulatory Visit: Payer: Self-pay

## 2022-08-01 ENCOUNTER — Other Ambulatory Visit: Payer: Self-pay

## 2022-08-01 DIAGNOSIS — D2261 Melanocytic nevi of right upper limb, including shoulder: Secondary | ICD-10-CM | POA: Diagnosis not present

## 2022-08-01 DIAGNOSIS — D485 Neoplasm of uncertain behavior of skin: Secondary | ICD-10-CM | POA: Diagnosis not present

## 2022-08-01 DIAGNOSIS — D2272 Melanocytic nevi of left lower limb, including hip: Secondary | ICD-10-CM | POA: Diagnosis not present

## 2022-08-01 DIAGNOSIS — D225 Melanocytic nevi of trunk: Secondary | ICD-10-CM | POA: Diagnosis not present

## 2022-08-01 DIAGNOSIS — L538 Other specified erythematous conditions: Secondary | ICD-10-CM | POA: Diagnosis not present

## 2022-08-01 DIAGNOSIS — D2271 Melanocytic nevi of right lower limb, including hip: Secondary | ICD-10-CM | POA: Diagnosis not present

## 2022-08-01 DIAGNOSIS — D224 Melanocytic nevi of scalp and neck: Secondary | ICD-10-CM | POA: Diagnosis not present

## 2022-08-01 DIAGNOSIS — R58 Hemorrhage, not elsewhere classified: Secondary | ICD-10-CM | POA: Diagnosis not present

## 2022-08-01 DIAGNOSIS — L821 Other seborrheic keratosis: Secondary | ICD-10-CM | POA: Diagnosis not present

## 2022-08-01 DIAGNOSIS — D2262 Melanocytic nevi of left upper limb, including shoulder: Secondary | ICD-10-CM | POA: Diagnosis not present

## 2022-08-01 MED ORDER — TRETINOIN 0.05 % EX CREA
TOPICAL_CREAM | CUTANEOUS | 2 refills | Status: AC
  Filled 2022-08-01: qty 45, 30d supply, fill #0
  Filled 2022-10-30: qty 45, 30d supply, fill #1

## 2022-08-02 ENCOUNTER — Other Ambulatory Visit: Payer: Self-pay

## 2022-08-03 ENCOUNTER — Other Ambulatory Visit: Payer: Self-pay

## 2022-08-03 MED ORDER — ZEPBOUND 12.5 MG/0.5ML ~~LOC~~ SOAJ
SUBCUTANEOUS | 0 refills | Status: DC
  Filled 2022-08-03: qty 6, 84d supply, fill #0

## 2022-08-31 ENCOUNTER — Other Ambulatory Visit: Payer: Self-pay

## 2022-09-12 ENCOUNTER — Other Ambulatory Visit: Payer: Self-pay

## 2022-10-02 ENCOUNTER — Ambulatory Visit: Payer: Commercial Managed Care - PPO | Admitting: Dermatology

## 2022-10-25 ENCOUNTER — Other Ambulatory Visit: Payer: Self-pay

## 2022-10-25 MED ORDER — ZEPBOUND 12.5 MG/0.5ML ~~LOC~~ SOAJ
12.5000 mg | SUBCUTANEOUS | 0 refills | Status: DC
  Filled 2022-10-25: qty 2, 28d supply, fill #0

## 2022-10-30 ENCOUNTER — Other Ambulatory Visit: Payer: Self-pay

## 2022-10-31 ENCOUNTER — Other Ambulatory Visit: Payer: Self-pay

## 2022-11-01 ENCOUNTER — Other Ambulatory Visit: Payer: Self-pay

## 2022-11-07 ENCOUNTER — Other Ambulatory Visit: Payer: Self-pay

## 2022-11-16 ENCOUNTER — Other Ambulatory Visit: Payer: Self-pay | Admitting: Family Medicine

## 2022-11-16 ENCOUNTER — Ambulatory Visit: Payer: 59 | Admitting: Family Medicine

## 2022-11-16 ENCOUNTER — Encounter: Payer: Self-pay | Admitting: Family Medicine

## 2022-11-16 VITALS — BP 112/74 | HR 83 | Temp 97.8°F | Ht 66.75 in | Wt 182.5 lb

## 2022-11-16 DIAGNOSIS — R5382 Chronic fatigue, unspecified: Secondary | ICD-10-CM

## 2022-11-16 DIAGNOSIS — R2231 Localized swelling, mass and lump, right upper limb: Secondary | ICD-10-CM | POA: Diagnosis not present

## 2022-11-16 DIAGNOSIS — R2230 Localized swelling, mass and lump, unspecified upper limb: Secondary | ICD-10-CM | POA: Insufficient documentation

## 2022-11-16 LAB — CBC WITH DIFFERENTIAL/PLATELET
Basophils Absolute: 0 10*3/uL (ref 0.0–0.1)
Basophils Relative: 0.8 % (ref 0.0–3.0)
Eosinophils Absolute: 0.1 10*3/uL (ref 0.0–0.7)
Eosinophils Relative: 2.1 % (ref 0.0–5.0)
HCT: 36.4 % (ref 36.0–46.0)
Hemoglobin: 12.3 g/dL (ref 12.0–15.0)
Lymphocytes Relative: 36.1 % (ref 12.0–46.0)
Lymphs Abs: 2.1 10*3/uL (ref 0.7–4.0)
MCHC: 33.9 g/dL (ref 30.0–36.0)
MCV: 89.3 fl (ref 78.0–100.0)
Monocytes Absolute: 0.4 10*3/uL (ref 0.1–1.0)
Monocytes Relative: 7.6 % (ref 3.0–12.0)
Neutro Abs: 3.1 10*3/uL (ref 1.4–7.7)
Neutrophils Relative %: 53.4 % (ref 43.0–77.0)
Platelets: 272 10*3/uL (ref 150.0–400.0)
RBC: 4.07 Mil/uL (ref 3.87–5.11)
RDW: 12.8 % (ref 11.5–15.5)
WBC: 5.8 10*3/uL (ref 4.0–10.5)

## 2022-11-16 NOTE — Patient Instructions (Addendum)
Call and schedule the ultrasound of the breast and axilla at [facility] breast center   Continue warm compresses  Let us know if the lump gets bigger or painful or does not continue to improve   Cbc today for fatigue

## 2022-11-16 NOTE — Progress Notes (Signed)
Subjective:    Patient ID: Kimberly Torres, female    DOB: [birth year], 42 y.o.   MRN: [id number]  HPI Pt presents with swollen LN? Under R arm  Wt Readings from Last 3 Encounters:  11/16/22 182 lb 8 oz (82.8 kg)  05/07/22 183 lb 2 oz (83.1 kg)  04/25/22 188 lb (85.3 kg)   28.80 kg/m  Vitals:   11/16/22 0759  BP: 112/74  Pulse: 83  Temp: 97.8 F (36.6 C)  SpO2: 98%   Last Friday noted lump in R axilla  Not sore  Has gone down significantly   Also more fatigued than usual   No other LN No signs and symptoms of illness  No trauma No shots   MGM with breast cancer     Normal mammogram 07/19/22    Patient Active Problem List   Diagnosis Date Noted   Lump of axilla 11/16/2022   Encounter for screening mammogram for breast cancer 04/25/2022   Varicose veins of leg with pain, bilateral 10/26/2021   Attention deficit hyperactivity disorder 11/23/2020   Depressive disorder 11/23/2020   History of COVID-19 06/21/2020   Fatigue 10/15/2019   Vitamin D deficiency 07/27/2019   Generalized anxiety disorder with panic attacks 07/13/2019   Immunization reaction 06/11/2019   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   HNP (herniated nucleus pulposus), lumbar 09/28/2013   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   Cluster headache 02/15/2012   Routine general medical examination at a health care facility 01/28/2012   Sinus tachycardia 09/11/2010   HEMANGIOMA, HEPATIC 03/14/2009   Attention deficit hyperactivity disorder (ADHD) 06/25/2007   SCOLIOSIS 06/25/2007   Past Medical History:  Diagnosis Date   Acne    sees derm- on accutane   Anxiety    Chondromalacia of right knee    right   Depression    Dysplastic nevus 05/16/2009   R deltoid - moderate   Dysplastic nevus 09/06/2009   R med knee - moderate   Dysrhythmia    hx SVT   H/O seasonal allergies    Headache(784.0)    cluster migraines   Insomnia    Sinus tachycardia    eval by  Dr. Jens Som in 2007: monior revealed sinus tach. vs EAT; Echo 10/07: EF 70%   Varicose veins    eval. by vascular surgeon and procedure scheduled to correct   Past Surgical History:  Procedure Laterality Date   CESAREAN SECTION  2009   DIAGNOSTIC LAPAROSCOPY  2010   scar tissue   KNEE SURGERY  04/10/2010   LUMBAR LAMINECTOMY/DECOMPRESSION MICRODISCECTOMY Right 09/28/2013   Procedure: LUMBAR LAMINECTOMY/DECOMPRESSION MICRODISCECTOMY 1 LEVEL,LUMBAR  THREE-FOUR RIGHT ;  Surgeon: Temple Pacini, MD;  Location: [facility];  Service: Neurosurgery;  Laterality: Right;  right   MYRINGOTOMY     Social History   Tobacco Use   Smoking status: Never   Smokeless tobacco: Never  Vaping Use   Vaping Use: Never used  Substance Use Topics   Alcohol use: Yes    Alcohol/week: 0.0 standard drinks of alcohol    Comment: occasional to rare   Drug use: No   Family History  Problem Relation Age of Onset   Hypertension Mother    ADD / ADHD Mother    ADD / ADHD Sister    Breast cancer Maternal Grandmother 60   COPD Maternal Grandfather    Coronary artery disease Other        grandfather   Heart attack Other  grandfather   Breast cancer Other        grandmother   Bipolar disorder Other        grandmother   Pancreatic cancer Maternal Aunt    Allergies  Allergen Reactions   Covid-19 Mrna Vacc (Moderna) Anaphylaxis    WAS GIVEN THE PFIZER NOT MODERNA   Triamcinolone Acetonide Anaphylaxis   Covid-19 (Mrna) Vaccine    Current Outpatient Medications on File Prior to Visit  Medication Sig Dispense Refill   Cholecalciferol (DIALYVITE VITAMIN D 5000 PO) Take by mouth daily.     EPINEPHrine 0.3 mg/0.3 mL IJ SOAJ injection Inject 0.3 mLs (0.3 mg total) into the muscle as needed for anaphylaxis. 1 each 1   fluticasone (FLONASE) 50 MCG/ACT nasal spray Place 2 sprays into both nostrils daily as needed for allergies.      folic acid (FOLVITE) 1 MG tablet Take 1 tablet every day by oral route. 90  tablet 3   melatonin 5 MG TABS Take 5 mg by mouth at bedtime as needed.     metoprolol tartrate (LOPRESSOR) 25 MG tablet Take 1 tablet (25 mg total) by mouth 2 (two) times daily for elevated blood pressure, headache or high pulse rate. 180 tablet 3   mometasone (ELOCON) 0.1 % lotion Apply daily to affected areas on scalp as needed 30 mL 2   Norethindrone-Ethinyl Estradiol-Fe Biphas (LO LOESTRIN FE) 1 MG-10 MCG / 10 MCG tablet continuous active pill 112 tablet 3   Omega-3 Fatty Acids (FISH OIL) 1000 MG CAPS Take 2 capsules by mouth daily.     sertraline (ZOLOFT) 50 MG tablet Take 1 tablet (50 mg total) by mouth daily. 90 tablet 3   tretinoin (RETIN-A) 0.05 % cream Apply a pea-sized amount to dry face as bedtime 45 g 2   tretinoin (RETIN-A) 0.05 % cream Apply pea size amount to dry face every night 45 g 2   ZEPBOUND 12.5 MG/0.5ML Pen Inject 12.5 mg into the skin once a week. 2 mL 0   No current facility-administered medications on file prior to visit.    Review of Systems  Constitutional:  Positive for fatigue. Negative for activity change, appetite change, fever and unexpected weight change.  HENT:  Negative for congestion, ear pain, rhinorrhea, sinus pressure and sore throat.   Eyes:  Negative for pain, redness and visual disturbance.  Respiratory:  Negative for cough, shortness of breath and wheezing.   Cardiovascular:  Negative for chest pain and palpitations.  Gastrointestinal:  Negative for abdominal pain, blood in stool, constipation and diarrhea.  Endocrine: Negative for polydipsia and polyuria.  Genitourinary:  Negative for dysuria, frequency and urgency.  Musculoskeletal:  Negative for arthralgias, back pain and myalgias.  Skin:  Negative for pallor and rash.  Allergic/Immunologic: Negative for environmental allergies.  Neurological:  Negative for dizziness, syncope and headaches.  Hematological:  Negative for adenopathy. Does not bruise/bleed easily.  Psychiatric/Behavioral:   Negative for decreased concentration and dysphoric mood. The patient is not nervous/anxious.        Objective:   Physical Exam Constitutional:      General: She is not in acute distress.    Appearance: She is well-developed.  HENT:     Head: Normocephalic and atraumatic.  Eyes:     Conjunctiva/sclera: Conjunctivae normal.     Pupils: Pupils are equal, round, and reactive to light.  Neck:     Thyroid: No thyromegaly.     Vascular: No carotid bruit or JVD.  Cardiovascular:  Rate and Rhythm: Normal rate and regular rhythm.     Heart sounds: Normal heart sounds.     No gallop.  Pulmonary:     Effort: Pulmonary effort is normal. No respiratory distress.     Breath sounds: Normal breath sounds. No wheezing or rales.  Abdominal:     General: There is no distension or abdominal bruit.     Palpations: Abdomen is soft.  Genitourinary:    Comments: Breast exam: No mass, nodules, thickening, tenderness, bulging, retraction, inflamation, nipple discharge or skin changes noted.  No axillary or clavicular LN noted in L breast Small ( 1 cm ) mobile mass in R axilla consistent with LN - is non tender   Musculoskeletal:     Cervical back: Normal range of motion and neck supple.     Right lower leg: No edema.     Left lower leg: No edema.  Lymphadenopathy:     Head:     Right side of head: No submental, submandibular, preauricular, posterior auricular or occipital adenopathy.     Left side of head: No submental, submandibular, preauricular, posterior auricular or occipital adenopathy.     Cervical: No cervical adenopathy.     Right cervical: No superficial, deep or posterior cervical adenopathy.    Left cervical: No superficial, deep or posterior cervical adenopathy.     Upper Body:     Right upper body: No supraclavicular or epitrochlear adenopathy.     Left upper body: No supraclavicular or epitrochlear adenopathy.     Lower Body: No right inguinal adenopathy. No left inguinal  adenopathy.  Skin:    General: Skin is warm and dry.     Coloration: Skin is not pale.     Findings: No rash.  Neurological:     Mental Status: She is alert.     Coordination: Coordination normal.     Deep Tendon Reflexes: Reflexes are normal and symmetric. Reflexes normal.  Psychiatric:        Mood and Affect: Mood normal.           Assessment & Plan:   Problem List Items Addressed This Visit       Other   Lump of axilla - Primary    Small lump in R axilla with no skin change and no discomfort  For 1 week- now smaller than it was  No source of inflammation or infection noted Normal breast exam, mammogram was 07/2022  [place] of breast R and axilla ordered Cbc (also some fatigue)   Instructed to call if this increase in size or new symptoms      Relevant Orders   US BREAST COMPLETE UNI RIGHT INC AXILLA   CBC with Differential/Platelet   Fatigue    Cbc today  Also small lump in R axilla  No other lumps or LNs noted on exam      Relevant Orders   CBC with Differential/Platelet

## 2022-11-16 NOTE — Assessment & Plan Note (Signed)
Small lump in R axilla with no skin change and no discomfort  For 1 week- now smaller than it was  No source of inflammation or infection noted Normal breast exam, mammogram was 07/2022  [place] of breast R and axilla ordered Cbc (also some fatigue)   Instructed to call if this increase in size or new symptoms

## 2022-11-16 NOTE — Assessment & Plan Note (Signed)
Cbc today  Also small lump in R axilla  No other lumps or LNs noted on exam

## 2022-11-26 ENCOUNTER — Ambulatory Visit
Admission: RE | Admit: 2022-11-26 | Discharge: 2022-11-26 | Disposition: A | Discharge status: Home / Self Care | Payer: 59 | Source: Ambulatory Visit | Attending: Family Medicine | Admitting: Family Medicine

## 2022-11-26 ENCOUNTER — Other Ambulatory Visit: Payer: Self-pay | Admitting: Family Medicine

## 2022-11-26 DIAGNOSIS — R5382 Chronic fatigue, unspecified: Secondary | ICD-10-CM

## 2022-11-26 DIAGNOSIS — N6489 Other specified disorders of breast: Secondary | ICD-10-CM | POA: Diagnosis not present

## 2022-11-26 DIAGNOSIS — R2231 Localized swelling, mass and lump, right upper limb: Secondary | ICD-10-CM | POA: Insufficient documentation

## 2022-11-26 DIAGNOSIS — R92321 Mammographic fibroglandular density, right breast: Secondary | ICD-10-CM | POA: Diagnosis not present

## 2022-11-27 ENCOUNTER — Other Ambulatory Visit: Payer: Self-pay

## 2022-11-27 MED ORDER — ZEPBOUND 12.5 MG/0.5ML ~~LOC~~ SOAJ
12.5000 mg | SUBCUTANEOUS | 0 refills | Status: DC
  Filled 2022-11-27: qty 2, 28d supply, fill #0

## 2022-12-12 ENCOUNTER — Other Ambulatory Visit: Payer: Self-pay | Admitting: Oncology

## 2022-12-12 DIAGNOSIS — Z006 Encounter for examination for normal comparison and control in clinical research program: Secondary | ICD-10-CM

## 2022-12-19 ENCOUNTER — Other Ambulatory Visit
Admission: RE | Admit: 2022-12-19 | Discharge: 2022-12-19 | Disposition: A | Discharge status: Home / Self Care | Payer: Self-pay | Attending: Oncology | Admitting: Oncology

## 2022-12-19 DIAGNOSIS — Z006 Encounter for examination for normal comparison and control in clinical research program: Secondary | ICD-10-CM | POA: Insufficient documentation

## 2023-01-05 ENCOUNTER — Other Ambulatory Visit: Payer: Self-pay

## 2023-01-05 MED ORDER — ZEPBOUND 12.5 MG/0.5ML ~~LOC~~ SOAJ
12.5000 mg | SUBCUTANEOUS | 0 refills | Status: DC
  Filled 2023-01-05: qty 2, 28d supply, fill #0

## 2023-01-06 ENCOUNTER — Other Ambulatory Visit: Payer: Self-pay

## 2023-01-15 ENCOUNTER — Other Ambulatory Visit: Payer: Self-pay

## 2023-02-04 ENCOUNTER — Other Ambulatory Visit: Payer: Self-pay

## 2023-02-04 MED ORDER — ZEPBOUND 12.5 MG/0.5ML ~~LOC~~ SOAJ
SUBCUTANEOUS | 0 refills | Status: DC
  Filled 2023-02-04: qty 2, 28d supply, fill #0

## 2023-02-05 ENCOUNTER — Other Ambulatory Visit: Payer: Self-pay

## 2023-02-06 ENCOUNTER — Other Ambulatory Visit (HOSPITAL_COMMUNITY): Payer: Self-pay

## 2023-02-08 ENCOUNTER — Other Ambulatory Visit: Payer: Self-pay

## 2023-02-08 DIAGNOSIS — M62830 Muscle spasm of back: Secondary | ICD-10-CM | POA: Diagnosis not present

## 2023-02-08 MED ORDER — METHOCARBAMOL 500 MG PO TABS
500.0000 mg | ORAL_TABLET | Freq: Three times a day (TID) | ORAL | 0 refills | Status: AC | PRN
  Filled 2023-02-08: qty 30, 10d supply, fill #0

## 2023-02-08 MED ORDER — METHYLPREDNISOLONE 4 MG PO TBPK
ORAL_TABLET | ORAL | 0 refills | Status: DC
  Filled 2023-02-08: qty 21, 6d supply, fill #0

## 2023-02-08 MED ORDER — CYCLOBENZAPRINE HCL 5 MG PO TABS
5.0000 mg | ORAL_TABLET | Freq: Every evening | ORAL | 0 refills | Status: AC | PRN
  Filled 2023-02-08: qty 10, 10d supply, fill #0

## 2023-02-12 ENCOUNTER — Other Ambulatory Visit: Payer: Self-pay

## 2023-03-05 ENCOUNTER — Other Ambulatory Visit: Payer: Self-pay

## 2023-03-11 ENCOUNTER — Other Ambulatory Visit: Payer: Self-pay

## 2023-03-11 DIAGNOSIS — Z01419 Encounter for gynecological examination (general) (routine) without abnormal findings: Secondary | ICD-10-CM | POA: Diagnosis not present

## 2023-03-11 DIAGNOSIS — Z1331 Encounter for screening for depression: Secondary | ICD-10-CM | POA: Diagnosis not present

## 2023-03-11 MED ORDER — FOLIC ACID 1 MG PO TABS
1.0000 mg | ORAL_TABLET | Freq: Every day | ORAL | 3 refills | Status: AC
  Filled 2023-03-11: qty 90, 90d supply, fill #0
  Filled 2023-06-13: qty 90, 90d supply, fill #1
  Filled 2023-09-03: qty 90, 90d supply, fill #2
  Filled 2023-11-29: qty 90, 90d supply, fill #3

## 2023-03-13 ENCOUNTER — Other Ambulatory Visit: Payer: Self-pay

## 2023-03-13 MED ORDER — ZEPBOUND 12.5 MG/0.5ML ~~LOC~~ SOAJ
12.5000 mg | SUBCUTANEOUS | 0 refills | Status: DC
  Filled 2023-03-13: qty 2, 28d supply, fill #0

## 2023-04-05 ENCOUNTER — Other Ambulatory Visit: Payer: Self-pay

## 2023-04-05 MED ORDER — ZEPBOUND 12.5 MG/0.5ML ~~LOC~~ SOAJ
12.5000 mg | SUBCUTANEOUS | 0 refills | Status: DC
  Filled 2023-04-05: qty 2, 28d supply, fill #0

## 2023-05-03 ENCOUNTER — Other Ambulatory Visit: Payer: Self-pay

## 2023-05-05 ENCOUNTER — Other Ambulatory Visit: Payer: Self-pay

## 2023-05-05 MED ORDER — ZEPBOUND 12.5 MG/0.5ML ~~LOC~~ SOAJ
SUBCUTANEOUS | 0 refills | Status: DC
  Filled 2023-05-05: qty 2, 28d supply, fill #0

## 2023-05-07 ENCOUNTER — Other Ambulatory Visit: Payer: Self-pay

## 2023-05-07 ENCOUNTER — Other Ambulatory Visit: Payer: Self-pay | Admitting: Family Medicine

## 2023-05-07 MED FILL — Sertraline HCl Tab 50 MG: ORAL | 90 days supply | Qty: 90 | Fill #0 | Status: AC

## 2023-05-07 MED FILL — Metoprolol Tartrate Tab 25 MG: ORAL | 90 days supply | Qty: 180 | Fill #0 | Status: AC

## 2023-05-19 ENCOUNTER — Telehealth: Payer: Self-pay | Admitting: Family Medicine

## 2023-05-19 DIAGNOSIS — E559 Vitamin D deficiency, unspecified: Secondary | ICD-10-CM

## 2023-05-19 DIAGNOSIS — Z0000 Encounter for general adult medical examination without abnormal findings: Secondary | ICD-10-CM

## 2023-05-19 NOTE — Telephone Encounter (Signed)
-----   Message from Alvina Chou sent at 05/07/2023 10:50 AM EST ----- Regarding: Lab orders for Mon, 12.16.24 Patient is scheduled for CPX labs, please order future labs, Thanks , Camelia Eng

## 2023-05-20 ENCOUNTER — Other Ambulatory Visit (INDEPENDENT_AMBULATORY_CARE_PROVIDER_SITE_OTHER): Payer: 59

## 2023-05-20 DIAGNOSIS — E559 Vitamin D deficiency, unspecified: Secondary | ICD-10-CM

## 2023-05-20 DIAGNOSIS — Z0000 Encounter for general adult medical examination without abnormal findings: Secondary | ICD-10-CM

## 2023-05-20 LAB — COMPREHENSIVE METABOLIC PANEL
ALT: 12 U/L (ref 0–35)
AST: 15 U/L (ref 0–37)
Albumin: 3.9 g/dL (ref 3.5–5.2)
Alkaline Phosphatase: 45 U/L (ref 39–117)
BUN: 10 mg/dL (ref 6–23)
CO2: 27 meq/L (ref 19–32)
Calcium: 8.6 mg/dL (ref 8.4–10.5)
Chloride: 107 meq/L (ref 96–112)
Creatinine, Ser: 0.76 mg/dL (ref 0.40–1.20)
GFR: 95.53 mL/min (ref >60.00)
Glucose, Bld: 87 mg/dL (ref 70–99)
Potassium: 4.1 meq/L (ref 3.5–5.1)
Sodium: 138 meq/L (ref 135–145)
Total Bilirubin: 0.5 mg/dL (ref 0.2–1.2)
Total Protein: 6.2 g/dL (ref 6.0–8.3)

## 2023-05-20 LAB — LIPID PANEL
Cholesterol: 129 mg/dL (ref 0–200)
HDL: 46.6 mg/dL (ref >39.00)
LDL Cholesterol: 71 mg/dL (ref 0–99)
NonHDL: 82.56
Total CHOL/HDL Ratio: 3
Triglycerides: 56 mg/dL (ref 0.0–149.0)
VLDL: 11.2 mg/dL (ref 0.0–40.0)

## 2023-05-20 LAB — CBC WITH DIFFERENTIAL/PLATELET
Basophils Absolute: 0 10*3/uL (ref 0.0–0.1)
Basophils Relative: 0.7 % (ref 0.0–3.0)
Eosinophils Absolute: 0.1 10*3/uL (ref 0.0–0.7)
Eosinophils Relative: 2.2 % (ref 0.0–5.0)
HCT: 36.3 % (ref 36.0–46.0)
Hemoglobin: 12.3 g/dL (ref 12.0–15.0)
Lymphocytes Relative: 30.9 % (ref 12.0–46.0)
Lymphs Abs: 1.8 10*3/uL (ref 0.7–4.0)
MCHC: 33.8 g/dL (ref 30.0–36.0)
MCV: 87.3 fL (ref 78.0–100.0)
Monocytes Absolute: 0.5 10*3/uL (ref 0.1–1.0)
Monocytes Relative: 7.7 % (ref 3.0–12.0)
Neutro Abs: 3.5 10*3/uL (ref 1.4–7.7)
Neutrophils Relative %: 58.5 % (ref 43.0–77.0)
Platelets: 265 10*3/uL (ref 150.0–400.0)
RBC: 4.16 Mil/uL (ref 3.87–5.11)
RDW: 13.6 % (ref 11.5–15.5)
WBC: 6 10*3/uL (ref 4.0–10.5)

## 2023-05-20 LAB — TSH: TSH: 1.66 u[IU]/mL (ref 0.35–5.50)

## 2023-05-20 LAB — VITAMIN D 25 HYDROXY (VIT D DEFICIENCY, FRACTURES): VITD: 55.6 ng/mL (ref 30.00–100.00)

## 2023-05-23 ENCOUNTER — Encounter: Payer: Self-pay | Admitting: Family Medicine

## 2023-05-23 ENCOUNTER — Other Ambulatory Visit: Payer: Self-pay

## 2023-05-23 ENCOUNTER — Ambulatory Visit: Payer: 59 | Admitting: Family Medicine

## 2023-05-23 ENCOUNTER — Ambulatory Visit (INDEPENDENT_AMBULATORY_CARE_PROVIDER_SITE_OTHER)
Admission: RE | Admit: 2023-05-23 | Discharge: 2023-05-23 | Disposition: A | Discharge status: Home / Self Care | Payer: 59 | Source: Ambulatory Visit | Attending: Family Medicine | Admitting: Family Medicine

## 2023-05-23 VITALS — BP 110/64 | HR 88 | Temp 98.5°F | Ht 66.75 in | Wt 174.2 lb

## 2023-05-23 DIAGNOSIS — J209 Acute bronchitis, unspecified: Secondary | ICD-10-CM

## 2023-05-23 DIAGNOSIS — R062 Wheezing: Secondary | ICD-10-CM | POA: Diagnosis not present

## 2023-05-23 DIAGNOSIS — R059 Cough, unspecified: Secondary | ICD-10-CM | POA: Diagnosis not present

## 2023-05-23 MED ORDER — HYDROCODONE BIT-HOMATROP MBR 5-1.5 MG/5ML PO SOLN
5.0000 mL | Freq: Three times a day (TID) | ORAL | 0 refills | Status: DC | PRN
  Filled 2023-05-23: qty 100, 7d supply, fill #0

## 2023-05-23 MED ORDER — ALBUTEROL SULFATE HFA 108 (90 BASE) MCG/ACT IN AERS
2.0000 | INHALATION_SPRAY | Freq: Four times a day (QID) | RESPIRATORY_TRACT | 3 refills | Status: AC | PRN
  Filled 2023-05-23: qty 6.7, 25d supply, fill #0
  Filled 2024-04-24: qty 6.7, 25d supply, fill #1
  Filled 2024-04-28: qty 6.7, 25d supply, fill #2

## 2023-05-23 MED ORDER — PREDNISONE 10 MG PO TABS
ORAL_TABLET | ORAL | 0 refills | Status: DC
  Filled 2023-05-23: qty 30, 12d supply, fill #0

## 2023-05-23 NOTE — Progress Notes (Signed)
Subjective:    Patient ID: Kimberly Torres, female    DOB: [birth year], 42 y.o.   MRN: [id number]  HPI  Wt Readings from Last 3 Encounters:  05/23/23 174 lb 4 oz (79 kg)  11/16/22 182 lb 8 oz (82.8 kg)  05/07/22 183 lb 2 oz (83.1 kg)   27.50 kg/m  Vitals:   05/23/23 1058  BP: 110/64  Pulse: 88  Temp: 98.5 F (36.9 C)  SpO2: 98%   Pt presents for c/o uri symptoms for 3 weeks   Started with pnd and some nasal congestion (caught from husband who gave it to her and her son) Then congestion in chest  Got a little better   Then cough worsened  Pretty dry cough   (a little phlegm in am -is clear)  Some wheezing and shortness of breath   No fever  No headache  No sinus or facial pain     Antihist Tessalon  Prometh dm  Albuterol   DG Chest 2 View Result Date: 05/23/2023 CLINICAL DATA:  Cough and wheezing for 3 weeks, initial encounter EXAM: CHEST - 2 VIEW COMPARISON:  07/04/2015 FINDINGS: The heart size and mediastinal contours are within normal limits. Both lungs are clear. The visualized skeletal structures are unremarkable. IMPRESSION: No active cardiopulmonary disease. Electronically Signed   By: Alcide Clever M.D.   On: 05/23/2023 15:16      Patient Active Problem List   Diagnosis Date Noted   Lump of axilla 11/16/2022   Encounter for screening mammogram for breast cancer 04/25/2022   Varicose veins of leg with pain, bilateral 10/26/2021   Attention deficit hyperactivity disorder 11/23/2020   Depressive disorder 11/23/2020   History of COVID-19 06/21/2020   Fatigue 10/15/2019   Vitamin D deficiency 07/27/2019   Generalized anxiety disorder with panic attacks 07/13/2019   Immunization reaction 06/11/2019   Inappropriate sinus tachycardia (HCC) 04/09/2014   HNP (herniated nucleus pulposus), lumbar 09/28/2013   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   Acute bronchitis 09/23/2012   Cluster headache 02/15/2012   Routine general medical  examination at a health care facility 01/28/2012   Sinus tachycardia 09/11/2010   HEMANGIOMA, HEPATIC 03/14/2009   Attention deficit hyperactivity disorder (ADHD) 06/25/2007   Idiopathic scoliosis and kyphoscoliosis 06/25/2007   Past Medical History:  Diagnosis Date   Acne    sees derm- on accutane   Anxiety    Chondromalacia of right knee    right   Depression    Dysplastic nevus 05/16/2009   R deltoid - moderate   Dysplastic nevus 09/06/2009   R med knee - moderate   Dysrhythmia    hx SVT   H/O seasonal allergies    Headache(784.0)    cluster migraines   Insomnia    Sinus tachycardia    eval by Dr. Jens Som in 2007: monior revealed sinus tach. vs EAT; Echo 10/07: EF 70%   Varicose veins    eval. by vascular surgeon and procedure scheduled to correct   Past Surgical History:  Procedure Laterality Date   CESAREAN SECTION  2009   DIAGNOSTIC LAPAROSCOPY  2010   scar tissue   KNEE SURGERY  04/10/2010   LUMBAR LAMINECTOMY/DECOMPRESSION MICRODISCECTOMY Right 09/28/2013   Procedure: LUMBAR LAMINECTOMY/DECOMPRESSION MICRODISCECTOMY 1 LEVEL,LUMBAR  THREE-FOUR RIGHT ;  Surgeon: Temple Pacini, MD;  Location: [facility];  Service: Neurosurgery;  Laterality: Right;  right   MYRINGOTOMY     Social History   Tobacco Use   Smoking status:  Never   Smokeless tobacco: Never  Vaping Use   Vaping status: Never Used  Substance Use Topics   Alcohol use: Yes    Alcohol/week: 0.0 standard drinks of alcohol    Comment: occasional to rare   Drug use: No   Family History  Problem Relation Age of Onset   Hypertension Mother    ADD / ADHD Mother    ADD / ADHD Sister    Breast cancer Maternal Grandmother 64   COPD Maternal Grandfather    Coronary artery disease Other        grandfather   Heart attack Other        grandfather   Breast cancer Other        grandmother   Bipolar disorder Other        grandmother   Pancreatic cancer Maternal Aunt    Allergies  Allergen Reactions    Covid-19 Mrna Vacc (Moderna) Anaphylaxis    WAS GIVEN THE PFIZER NOT MODERNA   Triamcinolone Acetonide Anaphylaxis   Covid-19 (Mrna) Vaccine    Current Outpatient Medications on File Prior to Visit  Medication Sig Dispense Refill   Cholecalciferol (DIALYVITE VITAMIN D 5000 PO) Take by mouth daily.     fluticasone (FLONASE) 50 MCG/ACT nasal spray Place 2 sprays into both nostrils daily as needed for allergies.      folic acid (FOLVITE) 1 MG tablet Take 1 tablet every day by oral route. 90 tablet 3   folic acid (FOLVITE) 1 MG tablet Take 1 tablet by mouth once daily. 90 tablet 3   melatonin 5 MG TABS Take 5 mg by mouth at bedtime as needed.     metoprolol tartrate (LOPRESSOR) 25 MG tablet Take 1 tablet (25 mg total) by mouth 2 (two) times daily. 180 tablet 0   mometasone (ELOCON) 0.1 % lotion Apply daily to affected areas on scalp as needed 30 mL 2   Omega-3 Fatty Acids (FISH OIL) 1000 MG CAPS Take 2 capsules by mouth daily.     sertraline (ZOLOFT) 50 MG tablet Take 1 tablet (50 mg total) by mouth daily. 90 tablet 0   tretinoin (RETIN-A) 0.05 % cream Apply a pea-sized amount to dry face as bedtime 45 g 2   tretinoin (RETIN-A) 0.05 % cream Apply pea size amount to dry face every night 45 g 2   ZEPBOUND 12.5 MG/0.5ML Pen Inject 12.5 mg subcutaneously every week. Please do not fill or substitute with any other GLP-1 than that prescribed (only fill with branded Zepbound, not compounded Tirzepatide.) 2 mL 0   No current facility-administered medications on file prior to visit.    Review of Systems     Objective:   Physical Exam Constitutional:      General: She is not in acute distress.    Appearance: Normal appearance. She is well-developed. She is not ill-appearing, toxic-appearing or diaphoretic.  HENT:     Head: Normocephalic and atraumatic.     Comments: Nares are injected and congested      Right Ear: Tympanic membrane, ear canal and external ear normal.     Left Ear: Tympanic  membrane, ear canal and external ear normal.     Nose: Congestion and rhinorrhea present.     Mouth/Throat:     Mouth: Mucous membranes are moist.     Pharynx: Oropharynx is clear. No oropharyngeal exudate or posterior oropharyngeal erythema.     Comments: Clear pnd  Eyes:     General:  Right eye: No discharge.        Left eye: No discharge.     Conjunctiva/sclera: Conjunctivae normal.     Pupils: Pupils are equal, round, and reactive to light.  Cardiovascular:     Rate and Rhythm: Normal rate.     Heart sounds: Normal heart sounds.  Pulmonary:     Effort: Pulmonary effort is normal. No respiratory distress.     Breath sounds: No stridor. Wheezing and rhonchi present. No rales.     Comments: Scattered rhonchi Wheeze on forced exp  No prolonged exp phase  Chest:     Chest wall: No tenderness.  Musculoskeletal:     Cervical back: Normal range of motion and neck supple.  Lymphadenopathy:     Cervical: No cervical adenopathy.  Skin:    General: Skin is warm and dry.     Capillary Refill: Capillary refill takes less than 2 seconds.     Findings: No rash.  Neurological:     Mental Status: She is alert.     Cranial Nerves: No cranial nerve deficit.  Psychiatric:        Mood and Affect: Mood normal.           Assessment & Plan:   Problem List Items Addressed This Visit       Respiratory   Acute bronchitis - Primary   Ongoing cough for 3 weeks with wheezing after uri  Some wheeze and rhonchi on exam  Cxr today : no infiltrate   Prednisone 40 mg taper /discussed side effects Hycodan for cough/caution of sedation  Albuterol mdi   Call back and Er precautions noted in detail today  Update if not starting to improve in a week or if worsening        Relevant Orders   DG Chest 2 View (Completed)

## 2023-05-23 NOTE — Patient Instructions (Addendum)
Keep drinking fluids   Chest xray today  Take prednisone as directed   Treat your symptoms   Caution of sedation with hycodan  Use inhaler as needed

## 2023-05-23 NOTE — Assessment & Plan Note (Addendum)
Ongoing cough for 3 weeks with wheezing after uri  Some wheeze and rhonchi on exam  Cxr today : no infiltrate   Prednisone 40 mg taper /discussed side effects Hycodan for cough/caution of sedation  Albuterol mdi   Call back and Er precautions noted in detail today  Update if not starting to improve in a week or if worsening

## 2023-05-24 ENCOUNTER — Other Ambulatory Visit: Payer: Self-pay

## 2023-05-24 MED ORDER — ZEPBOUND 12.5 MG/0.5ML ~~LOC~~ SOAJ
12.5000 mg | SUBCUTANEOUS | 0 refills | Status: DC
  Filled 2023-05-24: qty 2, 28d supply, fill #0

## 2023-05-27 ENCOUNTER — Other Ambulatory Visit: Payer: Self-pay

## 2023-05-27 ENCOUNTER — Encounter: Payer: Self-pay | Admitting: Family Medicine

## 2023-05-27 ENCOUNTER — Ambulatory Visit (INDEPENDENT_AMBULATORY_CARE_PROVIDER_SITE_OTHER): Payer: 59 | Admitting: Family Medicine

## 2023-05-27 VITALS — BP 116/70 | HR 87 | Temp 98.7°F | Ht 66.75 in | Wt 175.0 lb

## 2023-05-27 DIAGNOSIS — F411 Generalized anxiety disorder: Secondary | ICD-10-CM

## 2023-05-27 DIAGNOSIS — I4711 Inappropriate sinus tachycardia, so stated: Secondary | ICD-10-CM | POA: Diagnosis not present

## 2023-05-27 DIAGNOSIS — J209 Acute bronchitis, unspecified: Secondary | ICD-10-CM

## 2023-05-27 DIAGNOSIS — E559 Vitamin D deficiency, unspecified: Secondary | ICD-10-CM | POA: Diagnosis not present

## 2023-05-27 DIAGNOSIS — Z0000 Encounter for general adult medical examination without abnormal findings: Secondary | ICD-10-CM | POA: Diagnosis not present

## 2023-05-27 DIAGNOSIS — F410 Panic disorder [episodic paroxysmal anxiety] without agoraphobia: Secondary | ICD-10-CM | POA: Diagnosis not present

## 2023-05-27 DIAGNOSIS — Z1159 Encounter for screening for other viral diseases: Secondary | ICD-10-CM | POA: Diagnosis not present

## 2023-05-27 DIAGNOSIS — E663 Overweight: Secondary | ICD-10-CM | POA: Diagnosis not present

## 2023-05-27 DIAGNOSIS — R5382 Chronic fatigue, unspecified: Secondary | ICD-10-CM

## 2023-05-27 MED ORDER — SERTRALINE HCL 50 MG PO TABS
50.0000 mg | ORAL_TABLET | Freq: Every day | ORAL | 3 refills | Status: DC
  Filled 2023-05-27 – 2023-07-19 (×2): qty 90, 90d supply, fill #0
  Filled 2023-09-03 – 2023-10-08 (×2): qty 90, 90d supply, fill #1
  Filled 2024-02-28: qty 90, 90d supply, fill #2
  Filled 2024-04-28 – 2024-05-12 (×2): qty 90, 90d supply, fill #3

## 2023-05-27 MED ORDER — METOPROLOL TARTRATE 25 MG PO TABS
25.0000 mg | ORAL_TABLET | Freq: Two times a day (BID) | ORAL | 3 refills | Status: DC
  Filled 2023-05-27 – 2023-07-19 (×3): qty 180, 90d supply, fill #0
  Filled 2023-09-03 – 2023-10-08 (×2): qty 180, 90d supply, fill #1
  Filled 2024-02-28: qty 180, 90d supply, fill #2
  Filled 2024-04-28: qty 180, 90d supply, fill #3
  Filled 2024-05-12: qty 13, 7d supply, fill #3
  Filled 2024-05-12: qty 167, 83d supply, fill #3

## 2023-05-27 NOTE — Patient Instructions (Addendum)
Stay active  Add some strength training to your routine, this is important for bone and brain health and can reduce your risk of falls and help your body use insulin properly and regulate weight  Light weights, exercise bands , and internet videos are a good way to start  Yoga (chair or regular), machines , floor exercises or a gym with machines are also good options   Take care of yourself   Use sunscreen   Hep C screening today

## 2023-05-27 NOTE — Progress Notes (Unsigned)
Subjective:    Patient ID: Kimberly Torres, female    DOB: [birth year], 42 y.o.   MRN: [id number]  HPI  Here for health maintenance exam and to review chronic medical problems   Wt Readings from Last 3 Encounters:  05/27/23 175 lb (79.4 kg)  05/23/23 174 lb 4 oz (79 kg)  11/16/22 182 lb 8 oz (82.8 kg)   27.61 kg/m  Vitals:   05/27/23 1352  BP: 116/70  Pulse: 87  Temp: 98.7 F (37.1 C)  SpO2: 97%    Immunization History  Administered Date(s) Administered   Influenza Inj Mdck Quad Pf 03/18/2022   Influenza Whole 03/05/2007, 01/23/2009, 04/04/2010   Influenza,inj,Quad PF,6+ Mos 02/15/2014   Influenza-Unspecified 05/22/2013, 02/12/2017, 03/20/2018, 03/05/2019, 02/22/2020, 03/01/2021, 03/05/2023   Td 10/02/2004, 10/22/2004   Tdap 08/25/2014   Unspecified SARS-COV-2 Vaccination 06/01/2019    Health Maintenance Due  Topic Date Due   Hepatitis C Screening  Never done   Acute bronchitis -not worse/ getting a little better  Cxr reassuring   Hep C screen   Mammogram 07/2022  Self breast exam- no lumps   Gyn health Pap 12/2020  Gets them every 3 years  Stopped contraception when husb had vasectomy   Is considering starting back on OC to prevent ovarian cancer  Tolerated it fine when she took it  Plans to discuss with her gyn    Colon cancer screening -plans to get a colonoscopy in 2010   Bone health   Falls- none  Fractures-none  Supplements  Last vitamin D-low in past  Lab Results  Component Value Date   VD25OH 55.60 05/20/2023    Exercise  Walks  Uses elliptical 3 days per week  Kettle bell also     Mood    05/27/2023    1:56 PM 05/23/2023   11:05 AM 11/16/2022    8:27 AM 05/07/2022    9:30 AM 11/08/2020    9:50 AM  Depression screen PHQ 2/9  Decreased Interest 0 0 0 0 0  Down, Depressed, Hopeless 0 0 0 0 0  PHQ - 2 Score 0 0 0 0 0  Altered sleeping 0 0 1 1 0  Tired, decreased energy 1 1 2 1 1   Change in appetite 0 0 0 0 0   Feeling bad or failure about yourself  0 0 0 0 0  Trouble concentrating 0 0 0 0 0  Moving slowly or fidgety/restless 0 0 0 0 0  Suicidal thoughts 0 0 0 0 0  PHQ-9 Score 1 1 3 2 1   Difficult doing work/chores Not difficult at all Not difficult at all Not difficult at all     History of GAD and panic attacks in past  Continues sertraline 50 mg daily   Takes metoprolol as needed for tachycardia  BP Readings from Last 3 Encounters:  05/27/23 116/70  05/23/23 110/64  11/16/22 112/74   Pulse Readings from Last 3 Encounters:  05/27/23 87  05/23/23 88  11/16/22 83   Takes zepbound for weight loss  Weight loss program called join sequence  May consider weight training with them     Labs Lab Results  Component Value Date   NA 138 05/20/2023   K 4.1 05/20/2023   CO2 27 05/20/2023   GLUCOSE 87 05/20/2023   BUN 10 05/20/2023   CREATININE 0.76 05/20/2023   CALCIUM 8.6 05/20/2023   GFR 95.53 05/20/2023   GFRNONAA >60 06/03/2021   Lab Results  Component Value  Date   ALT 12 05/20/2023   AST 15 05/20/2023   ALKPHOS 45 05/20/2023   BILITOT 0.5 05/20/2023   Lab Results  Component Value Date   WBC 6.0 05/20/2023   HGB 12.3 05/20/2023   HCT 36.3 05/20/2023   MCV 87.3 05/20/2023   PLT 265.0 05/20/2023   Lab Results  Component Value Date   TSH 1.66 05/20/2023   Cholesterol Lab Results  Component Value Date   CHOL 129 05/20/2023   CHOL 140 05/03/2022   CHOL 139 11/03/2020   Lab Results  Component Value Date   HDL 46.60 05/20/2023   HDL 51.10 05/03/2022   HDL 45.30 11/03/2020   Lab Results  Component Value Date   LDLCALC 71 05/20/2023   LDLCALC 74 05/03/2022   LDLCALC 71 11/03/2020   Lab Results  Component Value Date   TRIG 56.0 05/20/2023   TRIG 76.0 05/03/2022   TRIG 112.0 11/03/2020   Lab Results  Component Value Date   CHOLHDL 3 05/20/2023   CHOLHDL 3 05/03/2022   CHOLHDL 3 11/03/2020   No results found for: "LDLDIRECT"   Eats very healthy  No  red meat  No pork  Very rarely eats fried foods     Patient Active Problem List   Diagnosis Date Noted   Overweight (BMI 25.0-29.9) 05/28/2023   Encounter for hepatitis C screening test for low risk patient 05/27/2023   Lump of axilla 11/16/2022   Encounter for screening mammogram for breast cancer 04/25/2022   Varicose veins of leg with pain, bilateral 10/26/2021   Attention deficit hyperactivity disorder 11/23/2020   Depressive disorder 11/23/2020   History of COVID-19 06/21/2020   Fatigue 10/15/2019   Vitamin D deficiency 07/27/2019   Generalized anxiety disorder with panic attacks 07/13/2019   Immunization reaction 06/11/2019   Inappropriate sinus tachycardia (HCC) 04/09/2014   HNP (herniated nucleus pulposus), lumbar 09/28/2013   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   Acute bronchitis 09/23/2012   Cluster headache 02/15/2012   Routine general medical examination at a health care facility 01/28/2012   Sinus tachycardia 09/11/2010   HEMANGIOMA, HEPATIC 03/14/2009   Attention deficit hyperactivity disorder (ADHD) 06/25/2007   Idiopathic scoliosis and kyphoscoliosis 06/25/2007   Past Medical History:  Diagnosis Date   Acne    sees derm- on accutane   Anxiety    Chondromalacia of right knee    right   Depression    Dysplastic nevus 05/16/2009   R deltoid - moderate   Dysplastic nevus 09/06/2009   R med knee - moderate   Dysrhythmia    hx SVT   H/O seasonal allergies    Headache(784.0)    cluster migraines   Insomnia    Sinus tachycardia    eval by Dr. Jens Som in 2007: monior revealed sinus tach. vs EAT; Echo 10/07: EF 70%   Varicose veins    eval. by vascular surgeon and procedure scheduled to correct   Past Surgical History:  Procedure Laterality Date   CESAREAN SECTION  2009   DIAGNOSTIC LAPAROSCOPY  2010   scar tissue   KNEE SURGERY  04/10/2010   LUMBAR LAMINECTOMY/DECOMPRESSION MICRODISCECTOMY Right 09/28/2013   Procedure: LUMBAR  LAMINECTOMY/DECOMPRESSION MICRODISCECTOMY 1 LEVEL,LUMBAR  THREE-FOUR RIGHT ;  Surgeon: Temple Pacini, MD;  Location: [facility];  Service: Neurosurgery;  Laterality: Right;  right   MYRINGOTOMY     Social History   Tobacco Use   Smoking status: Never   Smokeless tobacco: Never  Vaping Use  Vaping status: Never Used  Substance Use Topics   Alcohol use: Yes    Alcohol/week: 0.0 standard drinks of alcohol    Comment: occasional to rare   Drug use: No   Family History  Problem Relation Age of Onset   Hypertension Mother    ADD / ADHD Mother    ADD / ADHD Sister    Breast cancer Maternal Grandmother 4   COPD Maternal Grandfather    Coronary artery disease Other        grandfather   Heart attack Other        grandfather   Breast cancer Other        grandmother   Bipolar disorder Other        grandmother   Pancreatic cancer Maternal Aunt    Allergies  Allergen Reactions   Covid-19 Mrna Vacc (Moderna) Anaphylaxis    WAS GIVEN THE PFIZER NOT MODERNA   Triamcinolone Acetonide Anaphylaxis   Covid-19 (Mrna) Vaccine    Current Outpatient Medications on File Prior to Visit  Medication Sig Dispense Refill   albuterol (VENTOLIN HFA) 108 (90 Base) MCG/ACT inhaler Inhale 2 puffs into the lungs every 6 (six) hours as needed for wheezing or shortness of breath. 6.7 g 3   Cholecalciferol (DIALYVITE VITAMIN D 5000 PO) Take by mouth daily.     fluticasone (FLONASE) 50 MCG/ACT nasal spray Place 2 sprays into both nostrils daily as needed for allergies.      folic acid (FOLVITE) 1 MG tablet Take 1 tablet by mouth once daily. 90 tablet 3   HYDROcodone bit-homatropine (HYCODAN) 5-1.5 MG/5ML syrup Take 5 mLs by mouth every 8 (eight) hours as needed for cough. Caution of sedation 100 mL 0   melatonin 5 MG TABS Take 5 mg by mouth at bedtime as needed.     mometasone (ELOCON) 0.1 % lotion Apply daily to affected areas on scalp as needed 30 mL 2   Omega-3 Fatty Acids (FISH OIL) 1000 MG CAPS Take  2 capsules by mouth daily.     predniSONE (DELTASONE) 10 MG tablet Take 4 pills once daily by mouth for 3 days, then 3 pills daily for 3 days, then 2 pills daily for 3 days then 1 pill daily for 3 days then stop 30 tablet 0   tretinoin (RETIN-A) 0.05 % cream Apply pea size amount to dry face every night 45 g 2   ZEPBOUND 12.5 MG/0.5ML Pen Inject 12.5 mg into the skin once a week. 2 mL 0   No current facility-administered medications on file prior to visit.    Review of Systems  Constitutional:  Negative for activity change, appetite change, fatigue, fever and unexpected weight change.  HENT:  Negative for congestion, ear pain, rhinorrhea, sinus pressure and sore throat.   Eyes:  Negative for pain, redness and visual disturbance.  Respiratory:  Negative for cough, shortness of breath and wheezing.   Cardiovascular:  Negative for chest pain and palpitations.  Gastrointestinal:  Negative for abdominal pain, blood in stool, constipation and diarrhea.  Endocrine: Negative for polydipsia and polyuria.  Genitourinary:  Negative for dysuria, frequency and urgency.  Musculoskeletal:  Negative for arthralgias, back pain and myalgias.  Skin:  Negative for pallor and rash.  Allergic/Immunologic: Negative for environmental allergies.  Neurological:  Negative for dizziness, syncope and headaches.  Hematological:  Negative for adenopathy. Does not bruise/bleed easily.  Psychiatric/Behavioral:  Negative for decreased concentration and dysphoric mood. The patient is nervous/anxious.  Objective:   Physical Exam Constitutional:      General: She is not in acute distress.    Appearance: Normal appearance. She is well-developed. She is not ill-appearing or diaphoretic.     Comments: Over weight   HENT:     Head: Normocephalic and atraumatic.     Right Ear: Tympanic membrane, ear canal and external ear normal.     Left Ear: Tympanic membrane, ear canal and external ear normal.     Nose: Nose  normal. No congestion.     Mouth/Throat:     Mouth: Mucous membranes are moist.     Pharynx: Oropharynx is clear. No posterior oropharyngeal erythema.  Eyes:     General: No scleral icterus.    Extraocular Movements: Extraocular movements intact.     Conjunctiva/sclera: Conjunctivae normal.     Pupils: Pupils are equal, round, and reactive to light.  Neck:     Thyroid: No thyromegaly.     Vascular: No carotid bruit or JVD.  Cardiovascular:     Rate and Rhythm: Normal rate and regular rhythm.     Pulses: Normal pulses.     Heart sounds: Normal heart sounds.     No gallop.  Pulmonary:     Effort: Pulmonary effort is normal. No respiratory distress.     Breath sounds: Normal breath sounds. No wheezing.     Comments: Good air exch Chest:     Chest wall: No tenderness.  Abdominal:     General: Bowel sounds are normal. There is no distension or abdominal bruit.     Palpations: Abdomen is soft. There is no mass.     Tenderness: There is no abdominal tenderness.     Hernia: No hernia is present.  Genitourinary:    Comments: Breast and pelvic exam are done by gyn provider   Musculoskeletal:        General: No tenderness. Normal range of motion.     Cervical back: Normal range of motion and neck supple. No rigidity. No muscular tenderness.     Right lower leg: No edema.     Left lower leg: No edema.     Comments: No kyphosis   Lymphadenopathy:     Cervical: No cervical adenopathy.  Skin:    General: Skin is warm and dry.     Coloration: Skin is not pale.     Findings: No erythema or rash.     Comments: Solar lentigines diffusely   Neurological:     Mental Status: She is alert. Mental status is at baseline.     Cranial Nerves: No cranial nerve deficit.     Motor: No abnormal muscle tone.     Coordination: Coordination normal.     Gait: Gait normal.     Deep Tendon Reflexes: Reflexes are normal and symmetric. Reflexes normal.  Psychiatric:        Mood and Affect: Mood normal.         Cognition and Memory: Cognition and memory normal.           Assessment & Plan:   Problem List Items Addressed This Visit       Cardiovascular and Mediastinum   Inappropriate sinus tachycardia (HCC)   Continues metoprolol 25 mg bid Doing well       Relevant Medications   metoprolol tartrate (LOPRESSOR) 25 MG tablet     Respiratory   Acute bronchitis   Slowly improving Reassuring cxr        Other  Vitamin D deficiency   Improved Vitamin D level is therapeutic with current supplementation Disc importance of this to bone and overall health Last vitamin D Lab Results  Component Value Date   VD25OH 55.60 05/20/2023         Routine general medical examination at a health care facility - Primary   Reviewed health habits including diet and exercise and skin cancer prevention Reviewed appropriate screening tests for age  Also reviewed health mt list, fam hx and immunization status , as well as social and family history   See HPI Labs reviewed and ordered Hep C screen ordered  Mammogram due in feb  Pap utd / sees gyn  Will plan colonoscopy at age 87 for screening  Discussed fall prevention, supplements and exercise for bone density  {HQ 1 Health Maintenance  Topic Date Due   Hepatitis C Screening  Never done   Mammogram  07/20/2023   Pap with HPV screening  12/13/2023   DTaP/Tdap/Td vaccine (4 - Td or Tdap) 08/24/2024   Flu Shot  Completed   HIV Screening  Completed   HPV Vaccine  Aged Out   COVID-19 Vaccine  Discontinued         Overweight (BMI 25.0-29.9)   Pt takes zepbound 12.5 mg weekly from an on line clinic  She pays out of pocket  Has had success Commended on good lifestyle habits   We may have to take this prescription over in the future       Generalized anxiety disorder with panic attacks   Controlled  Continues sertraline 50 mg daily  Encouraged good self care and exercise       Relevant Medications   sertraline (ZOLOFT) 50  MG tablet   Encounter for hepatitis C screening test for low risk patient   Hep C screen ordered       Relevant Orders   Hepatitis C Antibody

## 2023-05-28 ENCOUNTER — Other Ambulatory Visit: Payer: Self-pay

## 2023-05-28 DIAGNOSIS — E663 Overweight: Secondary | ICD-10-CM | POA: Insufficient documentation

## 2023-05-28 LAB — HEPATITIS C ANTIBODY: Hepatitis C Ab: NONREACTIVE

## 2023-05-28 NOTE — Assessment & Plan Note (Signed)
Slowly improving Reassuring cxr

## 2023-05-28 NOTE — Assessment & Plan Note (Signed)
Controlled  Continues sertraline 50 mg daily  Encouraged good self care and exercise

## 2023-05-28 NOTE — Assessment & Plan Note (Signed)
Improved Vitamin D level is therapeutic with current supplementation Disc importance of this to bone and overall health Last vitamin D Lab Results  Component Value Date   VD25OH 55.60 05/20/2023

## 2023-05-28 NOTE — Assessment & Plan Note (Signed)
Hep C screen ordered.

## 2023-05-28 NOTE — Assessment & Plan Note (Signed)
Continues metoprolol 25 mg bid Doing well

## 2023-05-28 NOTE — Assessment & Plan Note (Signed)
Pt takes zepbound 12.5 mg weekly from an on line clinic  She pays out of pocket  Has had success Commended on good lifestyle habits   We may have to take this prescription over in the future

## 2023-05-28 NOTE — Assessment & Plan Note (Signed)
Reviewed health habits including diet and exercise and skin cancer prevention Reviewed appropriate screening tests for age  Also reviewed health mt list, fam hx and immunization status , as well as social and family history   See HPI Labs reviewed and ordered Hep C screen ordered  Mammogram due in feb  Pap utd / sees gyn  Will plan colonoscopy at age 42 for screening  Discussed fall prevention, supplements and exercise for bone density  {HQ 1 Health Maintenance  Topic Date Due   Hepatitis C Screening  Never done   Mammogram  07/20/2023   Pap with HPV screening  12/13/2023   DTaP/Tdap/Td vaccine (4 - Td or Tdap) 08/24/2024   Flu Shot  Completed   HIV Screening  Completed   HPV Vaccine  Aged Out   COVID-19 Vaccine  Discontinued

## 2023-05-31 ENCOUNTER — Other Ambulatory Visit: Payer: Self-pay

## 2023-06-10 ENCOUNTER — Other Ambulatory Visit: Payer: Self-pay | Admitting: Family Medicine

## 2023-06-10 DIAGNOSIS — Z1231 Encounter for screening mammogram for malignant neoplasm of breast: Secondary | ICD-10-CM

## 2023-06-25 ENCOUNTER — Other Ambulatory Visit: Payer: Self-pay

## 2023-06-25 MED ORDER — ZEPBOUND 12.5 MG/0.5ML ~~LOC~~ SOAJ
12.5000 mg | SUBCUTANEOUS | 0 refills | Status: DC
  Filled 2023-06-25: qty 2, 28d supply, fill #0

## 2023-07-19 ENCOUNTER — Other Ambulatory Visit: Payer: Self-pay

## 2023-07-22 ENCOUNTER — Ambulatory Visit
Admission: RE | Admit: 2023-07-22 | Discharge: 2023-07-22 | Disposition: A | Discharge status: Home / Self Care | Payer: 59 | Source: Ambulatory Visit | Attending: Family Medicine | Admitting: Family Medicine

## 2023-07-22 DIAGNOSIS — Z1231 Encounter for screening mammogram for malignant neoplasm of breast: Secondary | ICD-10-CM | POA: Insufficient documentation

## 2023-07-23 ENCOUNTER — Other Ambulatory Visit: Payer: Self-pay

## 2023-07-23 MED ORDER — ZEPBOUND 12.5 MG/0.5ML ~~LOC~~ SOAJ
12.5000 mg | SUBCUTANEOUS | 0 refills | Status: DC
  Filled 2023-07-23: qty 2, 28d supply, fill #0

## 2023-07-25 ENCOUNTER — Encounter: Payer: Self-pay | Admitting: Family Medicine

## 2023-08-05 ENCOUNTER — Other Ambulatory Visit: Payer: Self-pay

## 2023-08-05 DIAGNOSIS — D2261 Melanocytic nevi of right upper limb, including shoulder: Secondary | ICD-10-CM | POA: Diagnosis not present

## 2023-08-05 DIAGNOSIS — D2271 Melanocytic nevi of right lower limb, including hip: Secondary | ICD-10-CM | POA: Diagnosis not present

## 2023-08-05 DIAGNOSIS — D225 Melanocytic nevi of trunk: Secondary | ICD-10-CM | POA: Diagnosis not present

## 2023-08-05 DIAGNOSIS — D2262 Melanocytic nevi of left upper limb, including shoulder: Secondary | ICD-10-CM | POA: Diagnosis not present

## 2023-08-05 DIAGNOSIS — L821 Other seborrheic keratosis: Secondary | ICD-10-CM | POA: Diagnosis not present

## 2023-08-05 DIAGNOSIS — L408 Other psoriasis: Secondary | ICD-10-CM | POA: Diagnosis not present

## 2023-08-05 DIAGNOSIS — D2272 Melanocytic nevi of left lower limb, including hip: Secondary | ICD-10-CM | POA: Diagnosis not present

## 2023-08-05 DIAGNOSIS — L811 Chloasma: Secondary | ICD-10-CM | POA: Diagnosis not present

## 2023-08-05 MED ORDER — CLOBETASOL PROPIONATE 0.05 % EX SOLN
CUTANEOUS | 5 refills | Status: AC
  Filled 2023-08-05: qty 25, 25d supply, fill #0
  Filled 2023-11-29: qty 50, 50d supply, fill #1

## 2023-08-05 MED ORDER — HYDROQUINONE 4 % EX CREA
TOPICAL_CREAM | Freq: Every day | CUTANEOUS | 2 refills | Status: AC
  Filled 2023-08-05: qty 28.35, 30d supply, fill #0
  Filled 2023-11-29: qty 28.35, 30d supply, fill #1

## 2023-08-05 MED ORDER — TRETINOIN 0.05 % EX CREA
TOPICAL_CREAM | Freq: Every day | CUTANEOUS | 2 refills | Status: AC
  Filled 2023-08-05: qty 45, 30d supply, fill #0
  Filled 2023-11-29: qty 45, 30d supply, fill #1

## 2023-08-08 ENCOUNTER — Other Ambulatory Visit (HOSPITAL_COMMUNITY): Payer: Self-pay

## 2023-08-13 ENCOUNTER — Other Ambulatory Visit (HOSPITAL_COMMUNITY): Payer: Self-pay

## 2023-08-21 ENCOUNTER — Other Ambulatory Visit: Payer: Self-pay

## 2023-08-21 MED ORDER — ZEPBOUND 12.5 MG/0.5ML ~~LOC~~ SOAJ
12.5000 mg | SUBCUTANEOUS | 0 refills | Status: DC
  Filled 2023-08-21: qty 2, 28d supply, fill #0

## 2023-09-03 ENCOUNTER — Other Ambulatory Visit: Payer: Self-pay

## 2023-09-14 ENCOUNTER — Other Ambulatory Visit: Payer: Self-pay

## 2023-09-14 MED ORDER — ZEPBOUND 12.5 MG/0.5ML ~~LOC~~ SOAJ
12.5000 mg | SUBCUTANEOUS | 0 refills | Status: DC
Start: 2023-09-14 — End: 2023-10-08
  Filled 2023-09-14: qty 2, 28d supply, fill #0

## 2023-09-16 ENCOUNTER — Other Ambulatory Visit: Payer: Self-pay

## 2023-10-08 ENCOUNTER — Other Ambulatory Visit: Payer: Self-pay

## 2023-10-08 MED ORDER — ZEPBOUND 12.5 MG/0.5ML ~~LOC~~ SOAJ
12.5000 mg | SUBCUTANEOUS | 0 refills | Status: DC
Start: 2023-10-08 — End: 2023-10-30
  Filled 2023-10-08 – 2023-10-09 (×2): qty 2, 28d supply, fill #0

## 2023-10-09 ENCOUNTER — Other Ambulatory Visit (HOSPITAL_COMMUNITY): Payer: Self-pay

## 2023-10-09 ENCOUNTER — Other Ambulatory Visit: Payer: Self-pay

## 2023-10-17 DIAGNOSIS — H5203 Hypermetropia, bilateral: Secondary | ICD-10-CM | POA: Diagnosis not present

## 2023-10-30 ENCOUNTER — Other Ambulatory Visit: Payer: Self-pay

## 2023-10-30 MED ORDER — ZEPBOUND 12.5 MG/0.5ML ~~LOC~~ SOAJ
12.5000 mg | SUBCUTANEOUS | 0 refills | Status: DC
  Filled 2023-10-30: qty 2, 28d supply, fill #0

## 2023-11-29 ENCOUNTER — Other Ambulatory Visit: Payer: Self-pay

## 2023-12-01 ENCOUNTER — Other Ambulatory Visit: Payer: Self-pay

## 2023-12-01 MED ORDER — ZEPBOUND 12.5 MG/0.5ML ~~LOC~~ SOAJ
12.5000 mg | SUBCUTANEOUS | 0 refills | Status: DC
  Filled 2023-12-01: qty 2, 28d supply, fill #0

## 2023-12-13 ENCOUNTER — Other Ambulatory Visit: Payer: Self-pay

## 2023-12-17 ENCOUNTER — Other Ambulatory Visit: Payer: Self-pay

## 2023-12-31 ENCOUNTER — Other Ambulatory Visit: Payer: Self-pay

## 2023-12-31 MED ORDER — ZEPBOUND 12.5 MG/0.5ML ~~LOC~~ SOAJ
12.5000 mg | SUBCUTANEOUS | 0 refills | Status: AC
  Filled 2023-12-31 (×2): qty 2, 28d supply, fill #0

## 2024-01-17 ENCOUNTER — Telehealth: Admitting: Physician Assistant

## 2024-01-17 ENCOUNTER — Other Ambulatory Visit: Payer: Self-pay

## 2024-01-17 DIAGNOSIS — M545 Low back pain, unspecified: Secondary | ICD-10-CM | POA: Diagnosis not present

## 2024-01-17 MED ORDER — METHYLPREDNISOLONE 4 MG PO TBPK
ORAL_TABLET | ORAL | 0 refills | Status: DC
  Filled 2024-01-17: qty 21, fill #0
  Filled 2024-04-28: qty 21, 6d supply, fill #0

## 2024-01-17 MED ORDER — METHYLPREDNISOLONE 4 MG PO TBPK
ORAL_TABLET | ORAL | 0 refills | Status: DC
Start: 2024-01-17 — End: 2024-01-17
  Filled 2024-01-17: qty 21, 6d supply, fill #0

## 2024-01-17 MED ORDER — CYCLOBENZAPRINE HCL 10 MG PO TABS
10.0000 mg | ORAL_TABLET | Freq: Three times a day (TID) | ORAL | 0 refills | Status: AC | PRN
  Filled 2024-01-17: qty 30, 10d supply, fill #0

## 2024-01-17 NOTE — Progress Notes (Signed)
 E-Visit for Back Pain   We are sorry that you are not feeling well.  Here is how we plan to help!  Based on what you have shared with me it looks like you mostly have acute back pain.  Acute back pain is defined as musculoskeletal pain that can resolve in 1-3 weeks with conservative treatment.  I have prescribed a steroid dose pack for Medrol as well as a muscle relaxer cyclobenzaprine 10mg  daily at bedtime as needed. Please do not take any NSAIDS while taking the steroid. Please keep in mind that muscle relaxer's can cause fatigue and should not be taken while at work or driving.  Back pain is very common.  The pain often gets better over time.  The cause of back pain is usually not dangerous.  Most people can learn to manage their back pain on their own.  Home Care Stay active.  Start with short walks on flat ground if you can.  Try to walk farther each day. Do not sit, drive or stand in one place for more than 30 minutes.  Do not stay in bed. Do not avoid exercise or work.  Activity can help your back heal faster. Be careful when you bend or lift an object.  Bend at your knees, keep the object close to you, and do not twist. Sleep on a firm mattress.  Lie on your side, and bend your knees.  If you lie on your back, put a pillow under your knees. Only take medicines as told by your doctor. Put ice on the injured area. Put ice in a plastic bag Place a towel between your skin and the bag Leave the ice on for 15-20 minutes, 3-4 times a day for the first 2-3 days. 210 After that, you can switch between ice and heat packs. Ask your doctor about back exercises or massage. Avoid feeling anxious or stressed.  Find good ways to deal with stress, such as exercise.  Get Help Right Way If: Your pain does not go away with rest or medicine. Your pain does not go away in 1 week. You have new problems. You do not feel well. The pain spreads into your legs. You cannot control when you poop (bowel  movement) or pee (urinate) You feel sick to your stomach (nauseous) or throw up (vomit) You have belly (abdominal) pain. You feel like you may pass out (faint). If you develop a fever.  Make Sure you: Understand these instructions. Will watch your condition Will get help right away if you are not doing well or get worse.  Your e-visit answers were reviewed by a board certified advanced clinical practitioner to complete your personal care plan.  Depending on the condition, your plan could have included both over the counter or prescription medications.  If there is a problem please reply  once you have received a response from your provider.  Your safety is important to us .  If you have drug allergies check your prescription carefully.    You can use MyChart to ask questions about today's visit, request a non-urgent call back, or ask for a work or school excuse for 24 hours related to this e-Visit. If it has been greater than 24 hours you will need to follow up with your provider, or enter a new e-Visit to address those concerns.  You will get an e-mail in the next two days asking about your experience.  I hope that your e-visit has been valuable and will speed  your recovery. Thank you for using e-visits.   I have spent 5 minutes in review of e-visit questionnaire, review and updating patient chart, medical decision making and response to patient.   Kimberly DELENA Darby, FNP

## 2024-02-28 ENCOUNTER — Other Ambulatory Visit: Payer: Self-pay

## 2024-04-16 DIAGNOSIS — Z1211 Encounter for screening for malignant neoplasm of colon: Secondary | ICD-10-CM | POA: Diagnosis not present

## 2024-04-24 ENCOUNTER — Other Ambulatory Visit: Payer: Self-pay

## 2024-04-27 ENCOUNTER — Other Ambulatory Visit: Payer: Self-pay

## 2024-04-27 DIAGNOSIS — Z113 Encounter for screening for infections with a predominantly sexual mode of transmission: Secondary | ICD-10-CM | POA: Diagnosis not present

## 2024-04-27 DIAGNOSIS — Z1331 Encounter for screening for depression: Secondary | ICD-10-CM | POA: Diagnosis not present

## 2024-04-27 DIAGNOSIS — Z124 Encounter for screening for malignant neoplasm of cervix: Secondary | ICD-10-CM | POA: Diagnosis not present

## 2024-04-27 DIAGNOSIS — Z01411 Encounter for gynecological examination (general) (routine) with abnormal findings: Secondary | ICD-10-CM | POA: Diagnosis not present

## 2024-04-27 DIAGNOSIS — Z01419 Encounter for gynecological examination (general) (routine) without abnormal findings: Secondary | ICD-10-CM | POA: Diagnosis not present

## 2024-04-27 DIAGNOSIS — Z0142 Encounter for cervical smear to confirm findings of recent normal smear following initial abnormal smear: Secondary | ICD-10-CM | POA: Diagnosis not present

## 2024-04-27 MED ORDER — FOLIC ACID 1 MG PO TABS
1.0000 mg | ORAL_TABLET | Freq: Every day | ORAL | 3 refills | Status: AC
  Filled 2024-04-27: qty 90, 90d supply, fill #0

## 2024-04-28 ENCOUNTER — Telehealth: Payer: Self-pay | Admitting: *Deleted

## 2024-04-28 ENCOUNTER — Other Ambulatory Visit: Payer: Self-pay

## 2024-04-28 NOTE — Telephone Encounter (Signed)
 Copied from CRM #[id number]. Topic: Clinical - Request for Lab/Test Order >> Apr 28, 2024  3:41 PM [name] wrote: Reason for CRM: Patient has lab appt for physical scheduled for 06/01/24 and needs lab orders entered.

## 2024-04-28 NOTE — Telephone Encounter (Signed)
 Please schedule fasting lab appt prior to CPE, PCP will place orders closer to appt date

## 2024-05-09 ENCOUNTER — Other Ambulatory Visit: Payer: Self-pay

## 2024-05-12 ENCOUNTER — Other Ambulatory Visit: Payer: Self-pay

## 2024-05-24 ENCOUNTER — Telehealth: Payer: Self-pay | Admitting: Family Medicine

## 2024-05-24 DIAGNOSIS — E559 Vitamin D deficiency, unspecified: Secondary | ICD-10-CM

## 2024-05-24 DIAGNOSIS — Z0000 Encounter for general adult medical examination without abnormal findings: Secondary | ICD-10-CM

## 2024-05-24 NOTE — Telephone Encounter (Signed)
-----   Message from Veva JINNY Ferrari sent at 05/20/2024  3:17 PM EST ----- Regarding: Lab orders for Mon, 12.29.25 Patient is scheduled for CPX labs, please order future labs, Thanks , Veva

## 2024-06-01 ENCOUNTER — Other Ambulatory Visit

## 2024-06-01 ENCOUNTER — Ambulatory Visit: Payer: Self-pay | Admitting: Family Medicine

## 2024-06-01 ENCOUNTER — Other Ambulatory Visit: Payer: Self-pay

## 2024-06-01 DIAGNOSIS — E559 Vitamin D deficiency, unspecified: Secondary | ICD-10-CM | POA: Diagnosis not present

## 2024-06-01 DIAGNOSIS — Z0000 Encounter for general adult medical examination without abnormal findings: Secondary | ICD-10-CM | POA: Diagnosis not present

## 2024-06-01 LAB — TSH: TSH: 2.6 u[IU]/mL (ref 0.35–5.50)

## 2024-06-01 LAB — COMPREHENSIVE METABOLIC PANEL WITH GFR
ALT: 14 U/L (ref 3–35)
AST: 13 U/L (ref 5–37)
Albumin: 4 g/dL (ref 3.5–5.2)
Alkaline Phosphatase: 32 U/L — ABNORMAL LOW (ref 39–117)
BUN: 12 mg/dL (ref 6–23)
CO2: 26 meq/L (ref 19–32)
Calcium: 8.7 mg/dL (ref 8.4–10.5)
Chloride: 105 meq/L (ref 96–112)
Creatinine, Ser: 0.7 mg/dL (ref 0.40–1.20)
GFR: 104.68 mL/min
Glucose, Bld: 84 mg/dL (ref 70–99)
Potassium: 4.1 meq/L (ref 3.5–5.1)
Sodium: 138 meq/L (ref 135–145)
Total Bilirubin: 0.7 mg/dL (ref 0.2–1.2)
Total Protein: 6.3 g/dL (ref 6.0–8.3)

## 2024-06-01 LAB — CBC WITH DIFFERENTIAL/PLATELET
Basophils Absolute: 0.1 K/uL (ref 0.0–0.1)
Basophils Relative: 1 % (ref 0.0–3.0)
Eosinophils Absolute: 0.1 K/uL (ref 0.0–0.7)
Eosinophils Relative: 1.6 % (ref 0.0–5.0)
HCT: 36.3 % (ref 36.0–46.0)
Hemoglobin: 12.4 g/dL (ref 12.0–15.0)
Lymphocytes Relative: 33.4 % (ref 12.0–46.0)
Lymphs Abs: 1.9 K/uL (ref 0.7–4.0)
MCHC: 34 g/dL (ref 30.0–36.0)
MCV: 88.5 fl (ref 78.0–100.0)
Monocytes Absolute: 0.4 K/uL (ref 0.1–1.0)
Monocytes Relative: 7.2 % (ref 3.0–12.0)
Neutro Abs: 3.3 K/uL (ref 1.4–7.7)
Neutrophils Relative %: 56.8 % (ref 43.0–77.0)
Platelets: 239 K/uL (ref 150.0–400.0)
RBC: 4.1 Mil/uL (ref 3.87–5.11)
RDW: 12.7 % (ref 11.5–15.5)
WBC: 5.8 K/uL (ref 4.0–10.5)

## 2024-06-01 LAB — VITAMIN D 25 HYDROXY (VIT D DEFICIENCY, FRACTURES): VITD: 61.01 ng/mL (ref 30.00–100.00)

## 2024-06-01 LAB — LIPID PANEL
Cholesterol: 143 mg/dL (ref 28–200)
HDL: 54.6 mg/dL
LDL Cholesterol: 78 mg/dL (ref 10–99)
NonHDL: 88.39
Total CHOL/HDL Ratio: 3
Triglycerides: 54 mg/dL (ref 10.0–149.0)
VLDL: 10.8 mg/dL (ref 0.0–40.0)

## 2024-06-01 MED ORDER — SUTAB 1479-225-188 MG PO TABS
ORAL_TABLET | ORAL | 0 refills | Status: DC
  Filled 2024-06-01: qty 24, 1d supply, fill #0

## 2024-06-08 LAB — HM COLONOSCOPY

## 2024-06-11 ENCOUNTER — Other Ambulatory Visit: Payer: Self-pay

## 2024-06-11 ENCOUNTER — Encounter: Payer: Self-pay | Admitting: Family Medicine

## 2024-06-11 ENCOUNTER — Ambulatory Visit (INDEPENDENT_AMBULATORY_CARE_PROVIDER_SITE_OTHER): Admitting: Family Medicine

## 2024-06-11 VITALS — BP 114/68 | HR 76 | Temp 98.3°F | Ht 67.0 in | Wt 166.2 lb

## 2024-06-11 DIAGNOSIS — Z0000 Encounter for general adult medical examination without abnormal findings: Secondary | ICD-10-CM | POA: Diagnosis not present

## 2024-06-11 DIAGNOSIS — F32A Depression, unspecified: Secondary | ICD-10-CM

## 2024-06-11 DIAGNOSIS — F411 Generalized anxiety disorder: Secondary | ICD-10-CM | POA: Diagnosis not present

## 2024-06-11 DIAGNOSIS — E663 Overweight: Secondary | ICD-10-CM | POA: Diagnosis not present

## 2024-06-11 DIAGNOSIS — Z1231 Encounter for screening mammogram for malignant neoplasm of breast: Secondary | ICD-10-CM

## 2024-06-11 DIAGNOSIS — I4711 Inappropriate sinus tachycardia, so stated: Secondary | ICD-10-CM | POA: Diagnosis not present

## 2024-06-11 DIAGNOSIS — F410 Panic disorder [episodic paroxysmal anxiety] without agoraphobia: Secondary | ICD-10-CM | POA: Diagnosis not present

## 2024-06-11 DIAGNOSIS — E559 Vitamin D deficiency, unspecified: Secondary | ICD-10-CM

## 2024-06-11 MED ORDER — METOPROLOL TARTRATE 25 MG PO TABS
25.0000 mg | ORAL_TABLET | Freq: Two times a day (BID) | ORAL | 3 refills | Status: AC
  Filled 2024-06-11: qty 180, 90d supply, fill #0

## 2024-06-11 MED ORDER — SERTRALINE HCL 50 MG PO TABS
50.0000 mg | ORAL_TABLET | Freq: Every day | ORAL | 3 refills | Status: AC
  Filled 2024-06-11: qty 90, 90d supply, fill #0

## 2024-06-11 NOTE — Assessment & Plan Note (Signed)
 Stable Sertraline  50 mg daily  PHQ 2  Good self care

## 2024-06-11 NOTE — Assessment & Plan Note (Signed)
 Reviewed health habits including diet and exercise and skin cancer prevention Reviewed appropriate screening tests for age  Also reviewed health mt list, fam hx and immunization status , as well as social and family history   See HPI Labs reviewed and ordered Health Maintenance  Topic Date Due   Pneumococcal Vaccine (1 of 2 - PCV) Never done   Hepatitis B Vaccine (1 of 3 - 19+ 3-dose series) Never done   HPV Vaccine (1 - Risk 3-dose SCDM series) Never done   Pap with HPV screening  12/13/2023   Breast Cancer Screening  07/21/2024   DTaP/Tdap/Td vaccine (4 - Td or Tdap) 08/24/2024   Colon Cancer Screening  06/09/2031   Flu Shot  Completed   Hepatitis C Screening  Completed   HIV Screening  Completed   Meningitis B Vaccine  Aged Out   COVID-19 Vaccine  Discontinued   Utd gyn care Mammogram ordered  Discussed fall prevention, supplements and exercise for bone density  Utd dermatology care/uses sun protectoin  PHQ 2 with treatment

## 2024-06-11 NOTE — Assessment & Plan Note (Signed)
 Controlled  Continues sertraline  50 mg daily  Encouraged good self care and exercise   GAD score 7 today

## 2024-06-11 NOTE — Assessment & Plan Note (Signed)
 Takes metoprolol  as needed  Refilled this

## 2024-06-11 NOTE — Assessment & Plan Note (Signed)
 Mammogram ordered Pt will call to schedule

## 2024-06-11 NOTE — Patient Instructions (Addendum)
 Keep up the good work (especially exercise)  Lots of lean protein also  Take care of yourself    You have an order for:  [x]   3D Mammogram  []   Bone Density     Please call for appointment:   [x]   [facility]  [address] [place] [address]  [phone]  []   [facility])   [address]  [place], [place] [address]  [phone]  []   [facility]      [address] [place], [place]        [phone]         []   [facility]  [address] [place], [place]  [phone]  []  [facility]   [address]   [place], [place] [address]  [phone]  []  [facility]  [address] [place], [place] [address] [phone]  []  [facility]   [address] floor, [address]   [place], [place]. [address]  [phone]     Make sure to wear two piece clothing  No lotions powders or deodorants the day of the appointment Make sure to bring picture ID and insurance card.  Bring list of medications you are currently taking including any supplements.   Schedule your screening mammogram through MyChart!   Select [place] imaging sites can now be scheduled through MyChart.  Log into your MyChart account.  Go to Visit (or Appointments if  on mobile App) --> Schedule an  Appointment  Under Select a Reason for Visit choose the Mammogram  Screening option.  Complete the pre-visit questions  and select the time and place that  best fits your schedule

## 2024-06-11 NOTE — Progress Notes (Signed)
 "  Subjective:    Patient ID: Kimberly Torres, female    DOB: 12/27/1980, 44 y.o.   MRN: [id number]  HPI  Here for health maintenance exam and to review chronic medical problems   Wt Readings from Last 3 Encounters:  06/11/24 166 lb 4 oz (75.4 kg)  05/27/23 175 lb (79.4 kg)  05/23/23 174 lb 4 oz (79 kg)   26.04 kg/m  Vitals:   06/11/24 1105  BP: 114/68  Pulse: 76  Temp: 98.3 F (36.8 C)  SpO2: 100%    Immunization History  Administered Date(s) Administered   Influenza Inj Mdck Quad Pf 03/18/2022   Influenza Whole 03/05/2007, 01/23/2009, 04/04/2010   Influenza,inj,Quad PF,6+ Mos 02/15/2014   Influenza-Unspecified 05/22/2013, 02/12/2017, 03/20/2018, 03/05/2019, 02/22/2020, 03/01/2021, 03/05/2023, 03/24/2024   Td 10/02/2004, 10/22/2004   Tdap 08/25/2014   Unspecified SARS-COV-2 Vaccination 06/01/2019    Health Maintenance Due  Topic Date Due   Pneumococcal Vaccine (1 of 2 - PCV) Never done   Hepatitis B Vaccines 19-59 Average Risk (1 of 3 - 19+ 3-dose series) Never done   HPV VACCINES (1 - Risk 3-dose SCDM series) Never done   Cervical Cancer Screening (HPV/Pap Cotest)  12/13/2023   Mammogram  13-Aug-2024   Doing ok   Her MIL died on 2025/06/21- a lot to deal with   Flu shot -on oct   Mammogram 07/2023 -needs order for [place]  Self breast exam-no lumps   Gyn health Pap 12/2020 Visit with Dr Babetta in the fall  and had a pap -sent for records     Colon cancer screening  Colonoscopy 06/2024 (just had it) -adenoma , recall 7   Bone health   Falls-none  Fractures-none  Supplements - 5000 international units D3 daily Last vitamin D  Lab Results  Component Value Date   VD25OH 61.01 06/01/2024    Exercise  Walking 4 d per week outdoors  Strength training 15-20 lb -upper body  Careful with core due to her back -does some    Sees dermatology  Dr Dela Nothing new    Mood    06/11/2024   11:14 AM 05/27/2023    1:56 PM 05/23/2023   11:05 AM  11/16/2022    8:27 AM 05/07/2022    9:30 AM  Depression screen PHQ 2/9  Decreased Interest 0 0 0 0 0  Down, Depressed, Hopeless 0 0 0 0 0  PHQ - 2 Score 0 0 0 0 0  Altered sleeping 1 0 0 1 1  Tired, decreased energy 1 1 1 2 1   Change in appetite 0 0 0 0 0  Feeling bad or failure about yourself  0 0 0 0 0  Trouble concentrating 0 0 0 0 0  Moving slowly or fidgety/restless 0 0 0 0 0  Suicidal thoughts 0 0 0 0 0  PHQ-9 Score 2 1  1  3  2    Difficult doing work/chores Not difficult at all Not difficult at all Not difficult at all Not difficult at all      Data saved with a previous flowsheet row definition      06/11/2024   11:14 AM 05/27/2023    1:56 PM 05/23/2023   11:05 AM 11/16/2022    8:28 AM  GAD 7 : Generalized Anxiety Score  Nervous, Anxious, on Edge 0 0 0 1  Control/stop worrying 0 0 0 0  Worry too much - different things 0 0 0 0  Trouble relaxing 0 0 0 0  Restless 0 0 0 0  Easily annoyed or irritable 0 0 0 0  Afraid - awful might happen 0 0 0 0  Total GAD 7 Score 0 0 0 1  Anxiety Difficulty Not difficult at all Not difficult at all Not difficult at all Not difficult at all     History of ADHD Also depression  GAD with panic  Taking sertraline  50 mg daily  Ok considering the death in the family    Taking zepbound  12.5 mg weekly  Online weight loss clinic   Metoprolol  bid No light headedness  BP Readings from Last 3 Encounters:  06/11/24 114/68  05/27/23 116/70  05/23/23 110/64   Pulse Readings from Last 3 Encounters:  06/11/24 76  05/27/23 87  05/23/23 88     Cholesterol Lab Results  Component Value Date   CHOL 143 06/01/2024   CHOL 129 05/20/2023   CHOL 140 05/03/2022   Lab Results  Component Value Date   HDL 54.60 06/01/2024   HDL 46.60 05/20/2023   HDL 51.10 05/03/2022   Lab Results  Component Value Date   LDLCALC 78 06/01/2024   LDLCALC 71 05/20/2023   LDLCALC 74 05/03/2022   Lab Results  Component Value Date   TRIG 54.0  06/01/2024   TRIG 56.0 05/20/2023   TRIG 76.0 05/03/2022   Lab Results  Component Value Date   CHOLHDL 3 06/01/2024   CHOLHDL 3 05/20/2023   CHOLHDL 3 05/03/2022   No results found for: LDLDIRECT     Other labs Lab Results  Component Value Date   NA 138 06/01/2024   K 4.1 06/01/2024   CO2 26 06/01/2024   GLUCOSE 84 06/01/2024   BUN 12 06/01/2024   CREATININE 0.70 06/01/2024   CALCIUM  8.7 06/01/2024   GFR 104.68 06/01/2024   GFRNONAA >60 06/03/2021   Lab Results  Component Value Date   ALT 14 06/01/2024   AST 13 06/01/2024   ALKPHOS 32 (L) 06/01/2024   BILITOT 0.7 06/01/2024   Lab Results  Component Value Date   WBC 5.8 06/01/2024   HGB 12.4 06/01/2024   HCT 36.3 06/01/2024   MCV 88.5 06/01/2024   PLT 239.0 06/01/2024   Lab Results  Component Value Date   TSH 2.60 06/01/2024     Patient Active Problem List   Diagnosis Date Noted   Overweight (BMI 25.0-29.9) 05/28/2023   Lump of axilla 11/16/2022   Encounter for screening mammogram for breast cancer 04/25/2022   Varicose veins of leg with pain, bilateral 10/26/2021   Attention deficit hyperactivity disorder 11/23/2020   Depressive disorder 11/23/2020   History of COVID-19 06/21/2020   Vitamin D  deficiency 07/27/2019   Generalized anxiety disorder with panic attacks 07/13/2019   Immunization reaction 06/11/2019   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   HNP (herniated nucleus pulposus), lumbar 09/28/2013   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   Cluster headache 02/15/2012   Routine general medical examination at a health care facility 01/28/2012   Sinus tachycardia 09/11/2010   HEMANGIOMA, HEPATIC 03/14/2009   Attention deficit hyperactivity disorder (ADHD) 06/25/2007   Idiopathic scoliosis and kyphoscoliosis 06/25/2007   Past Medical History:  Diagnosis Date   Acne    sees derm- on accutane    Anxiety 2000   Chondromalacia of right knee    right   Depression 2000    Dysplastic nevus 05/16/2009   R deltoid - moderate   Dysplastic nevus 09/06/2009   R med knee - moderate   Dysrhythmia  hx SVT   H/O seasonal allergies    Headache(784.0)    cluster migraines   Insomnia    Sinus tachycardia    eval by Dr. Pietro in 2007: monior revealed sinus tach. vs EAT; Echo 10/07: EF 70%   Varicose veins    eval. by vascular surgeon and procedure scheduled to correct   Past Surgical History:  Procedure Laterality Date   CESAREAN SECTION  06/05/2007   DIAGNOSTIC LAPAROSCOPY  06/04/2008   scar tissue   KNEE SURGERY  04/10/2010   LUMBAR LAMINECTOMY/DECOMPRESSION MICRODISCECTOMY Right 09/28/2013   Procedure: LUMBAR LAMINECTOMY/DECOMPRESSION MICRODISCECTOMY 1 LEVEL,LUMBAR  THREE-FOUR RIGHT ;  Surgeon: Victory DELENA Gunnels, MD;  Location: [facility];  Service: Neurosurgery;  Laterality: Right;  right   MYRINGOTOMY     SPINE SURGERY  09/2013   Social History[1] Family History  Problem Relation Age of Onset   Hypertension Mother    ADD / ADHD Mother    Anxiety disorder Mother    Arthritis Mother    Depression Mother    Obesity Mother    Varicose Veins Mother    ADD / ADHD Sister    Alcohol abuse Sister    Anxiety disorder Sister    Depression Sister    Breast cancer Maternal Grandmother 73   Arthritis Maternal Grandmother    Cancer Maternal Grandmother    Depression Maternal Grandmother    Hypertension Maternal Grandmother    Varicose Veins Maternal Grandmother    COPD Maternal Grandfather    Hearing loss Maternal Grandfather    Coronary artery disease Other        grandfather   Heart attack Other        grandfather   Breast cancer Other        grandmother   Bipolar disorder Other        grandmother   Pancreatic cancer Maternal Aunt    Early death Paternal Grandfather    Heart disease Paternal Grandfather    Depression Sister    Cancer Maternal Aunt    Varicose Veins Maternal Aunt    Allergies[2] Medications Ordered Prior to  Encounter[3]  Review of Systems  Constitutional:  Negative for activity change, appetite change, fatigue, fever and unexpected weight change.  HENT:  Negative for congestion, ear pain, rhinorrhea, sinus pressure and sore throat.   Eyes:  Negative for pain, redness and visual disturbance.  Respiratory:  Negative for cough, shortness of breath and wheezing.   Cardiovascular:  Negative for chest pain and palpitations.  Gastrointestinal:  Negative for abdominal pain, blood in stool, constipation and diarrhea.  Endocrine: Negative for polydipsia and polyuria.  Genitourinary:  Negative for dysuria, frequency and urgency.  Musculoskeletal:  Negative for arthralgias, back pain and myalgias.  Skin:  Negative for pallor and rash.  Allergic/Immunologic: Negative for environmental allergies.  Neurological:  Negative for dizziness, syncope and headaches.  Hematological:  Negative for adenopathy. Does not bruise/bleed easily.  Psychiatric/Behavioral:  Negative for decreased concentration and dysphoric mood. The patient is not nervous/anxious.        Objective:   Physical Exam Constitutional:      General: She is not in acute distress.    Appearance: Normal appearance. She is well-developed. She is not ill-appearing or diaphoretic.  HENT:     Head: Normocephalic and atraumatic.     Right Ear: Tympanic membrane, ear canal and external ear normal.     Left Ear: Tympanic membrane, ear canal and external ear normal.  Nose: Nose normal. No congestion.     Mouth/Throat:     Mouth: Mucous membranes are moist.     Pharynx: Oropharynx is clear. No posterior oropharyngeal erythema.  Eyes:     General: No scleral icterus.    Extraocular Movements: Extraocular movements intact.     Conjunctiva/sclera: Conjunctivae normal.     Pupils: Pupils are equal, round, and reactive to light.  Neck:     Thyroid : No thyromegaly.     Vascular: No carotid bruit or JVD.  Cardiovascular:     Rate and Rhythm: Normal  rate and regular rhythm.     Pulses: Normal pulses.     Heart sounds: Normal heart sounds.     No gallop.  Pulmonary:     Effort: Pulmonary effort is normal. No respiratory distress.     Breath sounds: Normal breath sounds. No wheezing.     Comments: Good air exch Chest:     Chest wall: No tenderness.  Abdominal:     General: Bowel sounds are normal. There is no distension or abdominal bruit.     Palpations: Abdomen is soft. There is no mass.     Tenderness: There is no abdominal tenderness.     Hernia: No hernia is present.  Genitourinary:    Comments: Breast and pelvic exam are done by gyn provider   Musculoskeletal:        General: No tenderness. Normal range of motion.     Cervical back: Normal range of motion and neck supple. No rigidity. No muscular tenderness.     Right lower leg: No edema.     Left lower leg: No edema.     Comments: No kyphosis   Lymphadenopathy:     Cervical: No cervical adenopathy.  Skin:    General: Skin is warm and dry.     Coloration: Skin is not pale.     Findings: No erythema or rash.     Comments: Solar lentigines diffusely   Neurological:     Mental Status: She is alert. Mental status is at baseline.     Cranial Nerves: No cranial nerve deficit.     Motor: No abnormal muscle tone.     Coordination: Coordination normal.     Gait: Gait normal.     Deep Tendon Reflexes: Reflexes are normal and symmetric. Reflexes normal.  Psychiatric:        Mood and Affect: Mood normal.        Cognition and Memory: Cognition and memory normal.           Assessment & Plan:   Problem List Items Addressed This Visit       Cardiovascular and Mediastinum   Inappropriate sinus tachycardia   Continues metoprolol  25 mg bid Doing well       Relevant Medications   metoprolol  tartrate (LOPRESSOR ) 25 MG tablet     Other   Vitamin D  deficiency   Improved Vitamin D  level is therapeutic with current supplementation Disc importance of this to bone and  overall health Last vitamin D  Lab Results  Component Value Date   VD25OH 61.01 06/01/2024         Routine general medical examination at a health care facility - Primary   Reviewed health habits including diet and exercise and skin cancer prevention Reviewed appropriate screening tests for age  Also reviewed health mt list, fam hx and immunization status , as well as social and family history   See HPI Labs reviewed and  ordered Health Maintenance  Topic Date Due   Pneumococcal Vaccine (1 of 2 - PCV) Never done   Hepatitis B Vaccine (1 of 3 - 19+ 3-dose series) Never done   HPV Vaccine (1 - Risk 3-dose SCDM series) Never done   Pap with HPV screening  12/13/2023   Breast Cancer Screening  07/21/2024   DTaP/Tdap/Td vaccine (4 - Td or Tdap) 08/24/2024   Colon Cancer Screening  06/09/2031   Flu Shot  Completed   Hepatitis C Screening  Completed   HIV Screening  Completed   Meningitis B Vaccine  Aged Out   COVID-19 Vaccine  Discontinued   [facility]td gyn care Mammogram ordered  Discussed fall prevention, supplements and exercise for bone density  Utd dermatology care/uses sun protectoin  PHQ 2 with treatment        Overweight (BMI 25.0-29.9)   Continues zepbound  per outside clinic 12.5 mg weekly   Compliant with strength building exercise       Generalized anxiety disorder with panic attacks   Controlled  Continues sertraline  50 mg daily  Encouraged good self care and exercise   GAD score 7 today      Relevant Medications   sertraline  (ZOLOFT ) 50 MG tablet   Encounter for screening mammogram for breast cancer   Mammogram ordered Pt will call to schedule       Relevant Orders   MM 3D SCREENING MAMMOGRAM BILATERAL BREAST   Depressive disorder   Stable Sertraline  50 mg daily  PHQ 2  Good self care       Relevant Medications   sertraline  (ZOLOFT ) 50 MG tablet      [1]  Social History Tobacco Use   Smoking status: Never   Smokeless tobacco: Never  Vaping  Use   Vaping status: Never Used  Substance Use Topics   Alcohol use: Yes    Alcohol/week: 0.0 standard drinks of alcohol    Comment: occasional to rare   Drug use: No  [2]  Allergies Allergen Reactions   Covid-19 Mrna Vacc (Moderna) Anaphylaxis    WAS GIVEN THE PFIZER NOT MODERNA   Triamcinolone Acetonide Anaphylaxis   Covid-19 (Mrna) Vaccine   [3]  Current Outpatient Medications on File Prior to Visit  Medication Sig Dispense Refill   albuterol  (VENTOLIN  HFA) 108 (90 Base) MCG/ACT inhaler Inhale 2 puffs into the lungs every 6 (six) hours as needed for wheezing or shortness of breath. 6.7 g 3   Cholecalciferol (DIALYVITE VITAMIN D  5000 PO) Take by mouth daily.     clobetasol  (TEMOVATE ) 0.05 % external solution Apply daily to affected areas on scalp as needed. 25 mL 5   cyclobenzaprine  (FLEXERIL ) 10 MG tablet Take 1 tablet (10 mg total) by mouth 3 (three) times daily as needed for muscle spasms. 30 tablet 0   fluticasone  (FLONASE ) 50 MCG/ACT nasal spray Place 2 sprays into both nostrils daily as needed for allergies.      folic acid  (FOLVITE ) 1 MG tablet Take 1 tablet by mouth once daily. 90 tablet 3   folic acid  (FOLVITE ) 1 MG tablet Take 1 tablet (1 mg total) by mouth daily. 90 tablet 3   hydroquinone  4 % cream Apply to affected areas daily as need for six weeks, the take a break for 6 weeks 28.35 g 2   melatonin 5 MG TABS Take 5 mg by mouth at bedtime as needed.     mometasone  (ELOCON ) 0.1 % lotion Apply daily to affected areas on scalp as needed  30 mL 2   Omega-3 Fatty Acids (FISH OIL) 1000 MG CAPS Take 2 capsules by mouth daily.     tretinoin  (RETIN-A ) 0.05 % cream Apply pea size amount to dry face every night 45 g 2   tretinoin  (RETIN-A ) 0.05 % cream Apply pea size amount to dry face every night 45 g 2   ZEPBOUND  12.5 MG/0.5ML Pen Inject 12.5 mg into the skin once a week. 2 mL 0   No current facility-administered medications on file prior to visit.   "

## 2024-06-11 NOTE — Assessment & Plan Note (Signed)
 Improved Vitamin D  level is therapeutic with current supplementation Disc importance of this to bone and overall health Last vitamin D  Lab Results  Component Value Date   VD25OH 61.01 06/01/2024

## 2024-06-11 NOTE — Assessment & Plan Note (Signed)
 Continues metoprolol 25 mg bid Doing well

## 2024-06-11 NOTE — Assessment & Plan Note (Signed)
 Continues zepbound  per outside clinic 12.5 mg weekly   Compliant with strength building exercise

## 2024-06-26 ENCOUNTER — Other Ambulatory Visit: Payer: Self-pay

## 2024-07-23 ENCOUNTER — Encounter

## 2025-06-10 ENCOUNTER — Other Ambulatory Visit

## 2025-06-17 ENCOUNTER — Encounter: Admitting: Family Medicine
